# Patient Record
Sex: Female | Born: 1985
Health system: Southern US, Community
[De-identification: ages and names within clinical notes are randomized; demographics above are authoritative.]

## PROBLEM LIST (undated history)

## (undated) DIAGNOSIS — R000 Tachycardia, unspecified: Secondary | ICD-10-CM

## (undated) DIAGNOSIS — R238 Other skin changes: Secondary | ICD-10-CM

## (undated) DIAGNOSIS — L732 Hidradenitis suppurativa: Secondary | ICD-10-CM

## (undated) DIAGNOSIS — K760 Fatty (change of) liver, not elsewhere classified: Secondary | ICD-10-CM

## (undated) DIAGNOSIS — R233 Spontaneous ecchymoses: Secondary | ICD-10-CM

## (undated) DIAGNOSIS — R42 Dizziness and giddiness: Secondary | ICD-10-CM

## (undated) DIAGNOSIS — H43399 Other vitreous opacities, unspecified eye: Secondary | ICD-10-CM

## (undated) DIAGNOSIS — R490 Dysphonia: Secondary | ICD-10-CM

## (undated) DIAGNOSIS — R062 Wheezing: Secondary | ICD-10-CM

## (undated) DIAGNOSIS — O039 Complete or unspecified spontaneous abortion without complication: Secondary | ICD-10-CM

## (undated) DIAGNOSIS — N814 Uterovaginal prolapse, unspecified: Secondary | ICD-10-CM

## (undated) DIAGNOSIS — F439 Reaction to severe stress, unspecified: Secondary | ICD-10-CM

## (undated) DIAGNOSIS — R519 Headache, unspecified: Secondary | ICD-10-CM

## (undated) DIAGNOSIS — I10 Essential (primary) hypertension: Secondary | ICD-10-CM

## (undated) DIAGNOSIS — B379 Candidiasis, unspecified: Secondary | ICD-10-CM

## (undated) DIAGNOSIS — M797 Fibromyalgia: Secondary | ICD-10-CM

## (undated) DIAGNOSIS — N63 Unspecified lump in unspecified breast: Secondary | ICD-10-CM

## (undated) DIAGNOSIS — E063 Autoimmune thyroiditis: Secondary | ICD-10-CM

## (undated) DIAGNOSIS — N898 Other specified noninflammatory disorders of vagina: Secondary | ICD-10-CM

## (undated) DIAGNOSIS — K589 Irritable bowel syndrome without diarrhea: Secondary | ICD-10-CM

## (undated) DIAGNOSIS — G43909 Migraine, unspecified, not intractable, without status migrainosus: Secondary | ICD-10-CM

## (undated) DIAGNOSIS — R5383 Other fatigue: Secondary | ICD-10-CM

## (undated) DIAGNOSIS — Z308 Encounter for other contraceptive management: Secondary | ICD-10-CM

## (undated) DIAGNOSIS — E7800 Pure hypercholesterolemia, unspecified: Secondary | ICD-10-CM

## (undated) DIAGNOSIS — R110 Nausea: Secondary | ICD-10-CM

## (undated) DIAGNOSIS — M2550 Pain in unspecified joint: Secondary | ICD-10-CM

## (undated) DIAGNOSIS — F32A Depression, unspecified: Secondary | ICD-10-CM

## (undated) DIAGNOSIS — L853 Xerosis cutis: Secondary | ICD-10-CM

## (undated) DIAGNOSIS — E041 Nontoxic single thyroid nodule: Secondary | ICD-10-CM

## (undated) DIAGNOSIS — K5900 Constipation, unspecified: Secondary | ICD-10-CM

## (undated) DIAGNOSIS — M6289 Other specified disorders of muscle: Secondary | ICD-10-CM

## (undated) DIAGNOSIS — F329 Major depressive disorder, single episode, unspecified: Secondary | ICD-10-CM

## (undated) DIAGNOSIS — M549 Dorsalgia, unspecified: Secondary | ICD-10-CM

## (undated) DIAGNOSIS — E039 Hypothyroidism, unspecified: Secondary | ICD-10-CM

## (undated) DIAGNOSIS — E049 Nontoxic goiter, unspecified: Secondary | ICD-10-CM

## (undated) DIAGNOSIS — R252 Cramp and spasm: Secondary | ICD-10-CM

## (undated) DIAGNOSIS — R599 Enlarged lymph nodes, unspecified: Secondary | ICD-10-CM

## (undated) DIAGNOSIS — T7840XA Allergy, unspecified, initial encounter: Secondary | ICD-10-CM

## (undated) DIAGNOSIS — M791 Myalgia, unspecified site: Secondary | ICD-10-CM

## (undated) DIAGNOSIS — R6889 Other general symptoms and signs: Secondary | ICD-10-CM

## (undated) DIAGNOSIS — H539 Unspecified visual disturbance: Secondary | ICD-10-CM

## (undated) DIAGNOSIS — N943 Premenstrual tension syndrome: Secondary | ICD-10-CM

## (undated) DIAGNOSIS — R51 Headache: Secondary | ICD-10-CM

## (undated) DIAGNOSIS — M7989 Other specified soft tissue disorders: Secondary | ICD-10-CM

## (undated) HISTORY — DX: Other specified soft tissue disorders: M79.89

## (undated) HISTORY — DX: Major depressive disorder, single episode, unspecified: F32.9

## (undated) HISTORY — DX: Unspecified visual disturbance: H53.9

## (undated) HISTORY — DX: Xerosis cutis: L85.3

## (undated) HISTORY — DX: Wheezing: R06.2

## (undated) HISTORY — DX: Dizziness and giddiness: R42

## (undated) HISTORY — DX: Other fatigue: R53.83

## (undated) HISTORY — DX: Depression, unspecified: F32.A

## (undated) HISTORY — DX: Reaction to severe stress, unspecified: F43.9

## (undated) HISTORY — DX: Tachycardia, unspecified: R00.0

## (undated) HISTORY — DX: Fibromyalgia: M79.7

## (undated) HISTORY — DX: Enlarged lymph nodes, unspecified: R59.9

## (undated) HISTORY — DX: Cramp and spasm: R25.2

## (undated) HISTORY — DX: Migraine, unspecified, not intractable, without status migrainosus: G43.909

## (undated) HISTORY — DX: Dysphonia: R49.0

## (undated) HISTORY — DX: Other specified disorders of muscle: M62.89

## (undated) HISTORY — DX: Myalgia, unspecified site: M79.10

## (undated) HISTORY — DX: Nontoxic single thyroid nodule: E04.1

## (undated) HISTORY — DX: Constipation, unspecified: K59.00

## (undated) HISTORY — DX: Nontoxic goiter, unspecified: E04.9

## (undated) HISTORY — DX: Pure hypercholesterolemia, unspecified: E78.00

## (undated) HISTORY — DX: Headache, unspecified: R51.9

## (undated) HISTORY — DX: Nausea: R11.0

## (undated) HISTORY — DX: Complete or unspecified spontaneous abortion without complication: O03.9

## (undated) HISTORY — DX: Other vitreous opacities, unspecified eye: H43.399

## (undated) HISTORY — DX: Unspecified lump in unspecified breast: N63.0

## (undated) HISTORY — DX: Essential (primary) hypertension: I10

## (undated) HISTORY — DX: Hypothyroidism, unspecified: E03.9

## (undated) HISTORY — DX: Pain in unspecified joint: M25.50

## (undated) HISTORY — DX: Other general symptoms and signs: R68.89

## (undated) HISTORY — DX: Other specified noninflammatory disorders of vagina: N89.8

## (undated) HISTORY — DX: Spontaneous ecchymoses: R23.3

## (undated) HISTORY — DX: Autoimmune thyroiditis: E06.3

## (undated) HISTORY — DX: Fatty (change of) liver, not elsewhere classified: K76.0

## (undated) HISTORY — DX: Dorsalgia, unspecified: M54.9

## (undated) HISTORY — DX: Encounter for other contraceptive management: Z30.8

## (undated) HISTORY — DX: Allergy, unspecified, initial encounter: T78.40XA

## (undated) HISTORY — DX: Hidradenitis suppurativa: L73.2

## (undated) HISTORY — DX: Headache: R51

## (undated) HISTORY — DX: Other skin changes: R23.8

## (undated) HISTORY — DX: Premenstrual tension syndrome: N94.3

## (undated) HISTORY — DX: Irritable bowel syndrome, unspecified: K58.9

## (undated) HISTORY — DX: Uterovaginal prolapse, unspecified: N81.4

## (undated) HISTORY — DX: Candidiasis, unspecified: B37.9

---
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
## 2005-01-17 ENCOUNTER — Ambulatory Visit (HOSPITAL_COMMUNITY): Admission: RE | Admit: 2005-01-17 | Discharge: 2005-01-18 | Payer: Self-pay | Admitting: Obstetrics and Gynecology

## 2005-01-19 ENCOUNTER — Ambulatory Visit: Payer: Self-pay | Admitting: *Deleted

## 2006-08-24 ENCOUNTER — Inpatient Hospital Stay (HOSPITAL_COMMUNITY): Admission: AD | Admit: 2006-08-24 | Discharge: 2006-08-26 | Payer: Self-pay | Admitting: Obstetrics and Gynecology

## 2007-05-18 ENCOUNTER — Other Ambulatory Visit: Admission: RE | Admit: 2007-05-18 | Discharge: 2007-05-18 | Payer: Self-pay | Admitting: Obstetrics and Gynecology

## 2008-08-22 ENCOUNTER — Other Ambulatory Visit: Admission: RE | Admit: 2008-08-22 | Discharge: 2008-08-22 | Payer: Self-pay | Admitting: Obstetrics and Gynecology

## 2009-05-21 ENCOUNTER — Ambulatory Visit (HOSPITAL_COMMUNITY): Admission: RE | Admit: 2009-05-21 | Discharge: 2009-05-21 | Payer: Self-pay | Admitting: Obstetrics & Gynecology

## 2009-09-17 ENCOUNTER — Other Ambulatory Visit: Admission: RE | Admit: 2009-09-17 | Discharge: 2009-09-17 | Payer: Self-pay | Admitting: Obstetrics and Gynecology

## 2010-04-01 ENCOUNTER — Inpatient Hospital Stay (HOSPITAL_COMMUNITY)
Admission: AD | Admit: 2010-04-01 | Discharge: 2010-04-03 | Payer: Self-pay | Source: Home / Self Care | Attending: Family Medicine | Admitting: Family Medicine

## 2010-05-24 ENCOUNTER — Ambulatory Visit (HOSPITAL_COMMUNITY)
Admission: RE | Admit: 2010-05-24 | Discharge: 2010-05-24 | Disposition: A | Discharge status: Home / Self Care | Payer: Commercial Managed Care - PPO | Source: Ambulatory Visit | Attending: Obstetrics and Gynecology | Admitting: Obstetrics and Gynecology

## 2010-05-28 ENCOUNTER — Ambulatory Visit (HOSPITAL_COMMUNITY)
Admission: RE | Admit: 2010-05-28 | Discharge: 2010-05-28 | Disposition: A | Discharge status: Home / Self Care | Payer: Commercial Managed Care - PPO | Source: Ambulatory Visit | Attending: Obstetrics and Gynecology | Admitting: Obstetrics and Gynecology

## 2010-05-28 DIAGNOSIS — O923 Agalactia: Secondary | ICD-10-CM | POA: Insufficient documentation

## 2010-05-28 DIAGNOSIS — O925 Suppressed lactation: Secondary | ICD-10-CM | POA: Insufficient documentation

## 2010-06-11 ENCOUNTER — Ambulatory Visit (HOSPITAL_COMMUNITY)
Admission: RE | Admit: 2010-06-11 | Discharge: 2010-06-11 | Disposition: A | Discharge status: Home / Self Care | Payer: 59 | Source: Ambulatory Visit | Attending: Obstetrics and Gynecology | Admitting: Obstetrics and Gynecology

## 2010-06-11 DIAGNOSIS — O925 Suppressed lactation: Secondary | ICD-10-CM | POA: Insufficient documentation

## 2010-06-14 LAB — CBC
Hemoglobin: 11.5 g/dL — ABNORMAL LOW (ref 12.0–15.0)
MCH: 24.9 pg — ABNORMAL LOW (ref 26.0–34.0)
MCHC: 32.8 g/dL (ref 30.0–36.0)
Platelets: 245 10*3/uL (ref 150–400)

## 2010-06-14 LAB — ABO/RH: ABO/RH(D): O POS

## 2010-06-14 LAB — RPR: RPR Ser Ql: NONREACTIVE

## 2010-08-17 NOTE — H&P (Signed)
Hailey Holden, Hailey Holden               ACCOUNT NO.:  [id number]   MEDICAL RECORD NO.:  [id number]          PATIENT TYPE:  INP   LOCATION:  LDR1                          FACILITY:  [facility]   PHYSICIAN:  Lazaro Arms, M.D.   DATE OF BIRTH:  [birth year]   DATE OF ADMISSION:  08/24/2006  DATE OF DISCHARGE:  LH                              HISTORY & PHYSICAL   CHIEF COMPLAINT:  Active labor.   HISTORY OF PRESENT ILLNESS:  Hailey Holden is a 25 year-old gravida 1 para 0  within [facility] of September 03, 2006; placing her at about [redacted] weeks gestation.  She began prenatal care in her first trimester; and his head regular  visit and then.  Her pregnancy course has been uneventful.  Blood type  of positive, rubella immune, HV SAG, HIV, HSV, RPR, gonorrhea,  chlamydia, and group B Strep are all negative.  Blood pressures have  been 130s to 150s over 70s to 80s.  Total weight gain has been a  swelling 25 pounds with appropriate fundal height growth.   PAST MEDICAL HISTORY:  Positive for asthma and migraines.  No surgical  history   ALLERGIES:  PENICILLIN, LATEX, CLINDAMYCIN.   MEDICATIONS:  Ventolin inhaler and albuterol p.r.n.   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  Denies cigarettes, smoking, alcohol or drug use.   FAMILY HISTORY:  No contributing family history.   PHYSICAL EXAMINATION:  HEENT:  Within normal limits.  HEART:  Regular rate and rhythm.  LUNGS:  Clear.  ABDOMEN:  Soft and nontender.  She is having not her contractions every  few minutes.  Fetal heart rate is reactive without decelerations.  PELVIC:  Cervical exam upon admission 1-1/2 hours ago was 6 cm hundred  percent effaced, 0 station, with bulging membranes.  EXTREMITIES:  Trace edema.   IMPRESSION:  1. Intrauterine pregnancy at term.  2. Active labor.  3. Reassuring maternal-fetal status.   PLAN:  We will AROM and Dr. Despina Hidden is placing an epidural as I speak.      Jacklyn Shell,  C.N.M.      Lazaro Arms, M.D.  Electronically Signed    FC/MEDQ  D:  08/24/2006  T:  08/24/2006  Job:  [id number]   cc:   Dr. Gerda Diss   [facility]

## 2010-08-17 NOTE — Group Therapy Note (Signed)
[name], Hailey Holden               ACCOUNT NO.:  [id number]   MEDICAL RECORD NO.:  [id number]          PATIENT TYPE:  INP   LOCATION:  LDR4                          FACILITY:  [facility]   PHYSICIAN:  Lazaro Arms, M.D.   DATE OF BIRTH:  [birth year]   DATE OF PROCEDURE:  08/24/2006  DATE OF DISCHARGE:                                 PROGRESS NOTE   Hailey Holden received an epidural and while she was sitting up her water broke  with clear fluid.  She rapidly progressed in labor and was noted to be  complete at +3 station after about a 30-minute second stage she had a  spontaneous vaginal delivery of a viable female infant at 19:10.  Just  prior to delivery, a small midline episiotomy was cut. The body  delivered without difficulty.  Weight is 8 pounds 4 ounces, Apgar are 9  and 9. Twenty units of Pitocin diluted in 1000 mL of lactated Ringer's  was infused rapidly IV.  The placenta separated spontaneously and  delivered via controlled cord traction and 19:16.  It was inspected and  appears to be intact with a three-vessel cord.  The vagina was inspected  and no extension of the episiotomy was made. It was repaired under local  anesthesia with a 2-0 Vicryl suture.  The epidural catheter was then  removed with the blue tip visualized as being intact.  Estimated blood  loss 400 mL.      Hailey Holden, C.N.M.      Lazaro Arms, M.D.  Electronically Signed    FC/MEDQ  D:  08/24/2006  T:  08/24/2006  Job:  [id number]

## 2010-08-17 NOTE — Op Note (Signed)
NAMEASHLEY, BULTEMA               ACCOUNT NO.:  [id number]   MEDICAL RECORD NO.:  [id number]          PATIENT TYPE:  INP   LOCATION:  A412                          FACILITY:  [facility]   PHYSICIAN:  Lazaro Arms, M.D.   DATE OF BIRTH:  1985-10-01   DATE OF PROCEDURE:  08/24/2006  DATE OF DISCHARGE:  08/26/2006                               OPERATIVE REPORT   EPIDURAL NOTE:  Hailey Holden is a 25 year old female, gravida 1, para 0, in  active phase of labor, requesting an epidural be placed.   The patient was placed in a sitting position, Betadine prep was used, 1%  lidocaine was injected in the L3-4 interspace.  The area is field-  draped.  A 17-gauge Tuohy needle was used, the loss of resistance  technique employed, and the epidural space is found with one pass  without difficulty.  Bupivacaine 0.125% plain 10 mL is given into the  epidural space without difficulty.  The epidural catheter is then fed,  taped down 5 cm into the epidural space.  An additional 10 mL is given.  A continuous infusion of 0.125% bupivacaine with 2 mcg/mL of fentanyl  was begun at 12 mL/hr.  The patient tolerated the procedure well.  Fetal  heart rate tracing stable, blood pressure stable, and the patient is  getting pain relief.      Lazaro Arms, M.D.  Electronically Signed     LHE/MEDQ  D:  09/22/2006  T:  09/23/2006  Job:  [id number]

## 2010-09-02 ENCOUNTER — Other Ambulatory Visit: Payer: Self-pay | Admitting: Adult Health

## 2010-09-02 ENCOUNTER — Other Ambulatory Visit (HOSPITAL_COMMUNITY)
Admission: RE | Admit: 2010-09-02 | Discharge: 2010-09-02 | Disposition: A | Discharge status: Home / Self Care | Payer: 59 | Source: Ambulatory Visit | Attending: Obstetrics and Gynecology | Admitting: Obstetrics and Gynecology

## 2010-09-02 DIAGNOSIS — Z124 Encounter for screening for malignant neoplasm of cervix: Secondary | ICD-10-CM | POA: Insufficient documentation

## 2011-05-20 ENCOUNTER — Telehealth (HOSPITAL_COMMUNITY): Payer: Self-pay | Admitting: Dietician

## 2011-05-20 NOTE — Telephone Encounter (Signed)
Received referral from [facility] for dx: high cholesterol and LDL. Appointment set up for 06/03/11 at 2:30 PM.

## 2011-06-03 ENCOUNTER — Encounter (HOSPITAL_COMMUNITY): Payer: Self-pay | Admitting: Dietician

## 2011-06-06 ENCOUNTER — Encounter (HOSPITAL_COMMUNITY): Payer: Self-pay | Admitting: Dietician

## 2011-06-06 DIAGNOSIS — E785 Hyperlipidemia, unspecified: Secondary | ICD-10-CM | POA: Insufficient documentation

## 2011-06-06 NOTE — Progress Notes (Signed)
Outpatient Initial Nutrition Assessment  Date:06/03/2011   Time: 2:30 PM  Referring Physician: [facility] Reason for Visit: high cholesterol  Nutrition Assessment:  Ht: Height: 5\' 3"  (160 cm)   Wt:Weight: 188 lb (85.276 kg)   IBW: 115# %IBW: 163% UBW: 188# %UBW: 100% BMI: Body mass index is 33.30 kg/(m^2).  Goal Weight: 170# (10% weight loss) Weight hx: Pt reports 188# as her highest weight. Her lowest weight has 160-165# in high school, which she was able to maintain until about 3 years ago, due to her parents' divorce.   Estimated nutritional needs: 1806-1970 kcals daily, 68-85 grams protein daily, 1.8-2.0 L fluid daily  PMH:  Past Medical History  Diagnosis Date  . Asthma   . High cholesterol     Medications:  Current Outpatient Rx  Name Route Sig Dispense Refill  . VENTOLIN IN Inhalation Inhale into the lungs as needed.      Labs: CMP  No results found for this basename: na, k, cl, co2, glucose, bun, creatinine, calcium, prot, albumin, ast, alt, alkphos, bilitot, gfrnonaa, gfraa    Lipid Panel  No results found for this basename: chol, trig, hdl, cholhdl, vldl, ldlcalc     No results found for this basename: HGBA1C   No results found for this basename: GLUF, MICROALBUR, LDLCALC, CREATININE    Per [facility] work on 03/25/11: Na: 138, K: 4.5, Cl: 105, CO2: 21, BUN: 10, Creat: 0.6, Glucose: 78, Total Cholesterol: 244, LDL: 183, HDL: 41, Triglycerides: 98  Lifestyle/ social habits: Hailey Holden is a very pleasant woman, who lives in [place] with her husband and 2 children, ages 93 and 42 months. She works as an Public house manager at [employer]. She has lived in [place] her entire life. Her family also still resides here. She is very close with her mom and sister. Her relationship with her father is somewhat strained due to the divorce. She reports her stress level at 5, due to daily life and work. She reports "if you asked me 3 years ago it would have been an 8".   Nutrition  hx/habits: Hailey Holden is very concerned about her high cholesterol and is very grateful for the [facility] program. She is motivated to make changes. She requested this visit so she could learn and work on her diet prior to attending the classes. She has cut back on sweets and has been drinking more water. She reports she does not drink as many sodas, but is having difficulty reducing her sweet tea intake. She reports that she is a "stress eater". She currently does not exercise, but has tried Zumba in the past, which she enjoyed. She has also cut added salt out of her diet. She has a supportive work environment. She has been recording her food intake in a food diary for 1 week.   Diet recall: Breakfast: 1 cinnamon pop tart, mountain dew, granola bar; Snack: chex mix, water; Lunch: 2 [place], cheese, and mustard wraps, carrots, water; Snack: water, chex mix; Dinner: steak, corn, fried potatoes, deviled eggs, mountain dew; Snack: strawberry cake  Nutrition Diagnosis: Disordered eating pattern r/t excessive snacking AEB Total cholesterol: 244, gradual weight loss.   Nutrition Intervention: Nutrition rx: 1500 kcal NAS, heart healthy diet; 3 meals/day, 1 snack; 30 minutes physical activity daily  Education/Counseling Provided: Educated pt on heart healthy diet. Educated pt on reading food labels. Reviewed heart healthy cooking methods and food preparation methods. Reviewed portion sizes. Discussed importance of regular physical activity to assist with weight loss; discussed  slow, moderate weight loss of 1-2# per week. Discussed healthier snack ideas and ways to incorporate exercise into schedule. Praised pt for starting food diary; showed pt functionality of MyFitnessPal.  Understanding, Motivation, Ability to Follow Recommendations: Expect fair to good compliance.  Monitoring and Evaluation: Goals: 1) 1-2# weight loss per week; 2) 30 minutes physical activity daily  Recommendations: 1) For weight loss:  1306-1470 kcals daily; 2) Consider obtaining gym membership or prepaid exercise classes to hold yourself accountable; 3) Continue with food diary (ex. MyFitnessPal)  F/U:PRN. Provided RD contact information.   Orlene Plum, RD  06/03/2011  Time: 2:30 PM

## 2011-09-13 ENCOUNTER — Other Ambulatory Visit: Payer: Self-pay | Admitting: Adult Health

## 2011-09-13 DIAGNOSIS — E049 Nontoxic goiter, unspecified: Secondary | ICD-10-CM

## 2011-09-14 ENCOUNTER — Ambulatory Visit (HOSPITAL_COMMUNITY)
Admission: RE | Admit: 2011-09-14 | Discharge: 2011-09-14 | Disposition: A | Discharge status: Home / Self Care | Payer: 59 | Source: Ambulatory Visit | Attending: Adult Health | Admitting: Adult Health

## 2011-09-14 DIAGNOSIS — E049 Nontoxic goiter, unspecified: Secondary | ICD-10-CM | POA: Insufficient documentation

## 2012-08-22 ENCOUNTER — Other Ambulatory Visit: Payer: Self-pay | Admitting: Adult Health

## 2012-09-03 ENCOUNTER — Ambulatory Visit (INDEPENDENT_AMBULATORY_CARE_PROVIDER_SITE_OTHER): Payer: 59 | Admitting: Nurse Practitioner

## 2012-09-03 ENCOUNTER — Encounter: Payer: Self-pay | Admitting: Nurse Practitioner

## 2012-09-03 VITALS — BP 128/70 | Temp 98.5°F | Ht 64.0 in | Wt 195.2 lb

## 2012-09-03 DIAGNOSIS — J069 Acute upper respiratory infection, unspecified: Secondary | ICD-10-CM

## 2012-09-03 DIAGNOSIS — J45901 Unspecified asthma with (acute) exacerbation: Secondary | ICD-10-CM

## 2012-09-03 DIAGNOSIS — J45909 Unspecified asthma, uncomplicated: Secondary | ICD-10-CM | POA: Insufficient documentation

## 2012-09-03 DIAGNOSIS — J4521 Mild intermittent asthma with (acute) exacerbation: Secondary | ICD-10-CM

## 2012-09-03 MED ORDER — ALBUTEROL SULFATE HFA 108 (90 BASE) MCG/ACT IN AERS: 2.0000 | INHALATION_SPRAY | RESPIRATORY_TRACT | Status: DC | PRN

## 2012-09-03 MED ORDER — AZITHROMYCIN 250 MG PO TABS: ORAL_TABLET | ORAL | Status: DC

## 2012-09-03 MED ORDER — PREDNISONE 20 MG PO TABS: ORAL_TABLET | ORAL | Status: DC

## 2012-09-03 NOTE — Assessment & Plan Note (Signed)
Z-Pak as directed. Prednisone 20 mg nine-day taper. Switched to Ventolin HFA inhaler. OTC meds as directed for congestion. Call back in 48-72 hours if no improvement, sooner if worse.

## 2012-09-03 NOTE — Progress Notes (Signed)
Subjective:  Presents complaints of cough and congestion for the past 5 days. No fever. Frequent nonproductive cough. Green nasal drainage. Flareup of her asthma, frequent use of her albuterol inhaler. Has used it 2-3 times today. No headache. Slight sore throat. No ear pain. Taking fluids well.  Objective:   BP 128/70  Temp(Src) 98.5 F (36.9 C) (Oral)  Ht 5\' 4"  (1.626 m)  Wt 195 lb 4 oz (88.565 kg)  BMI 33.5 kg/m2 NAD. Alert, oriented. TMs minimal clear effusion, no erythema. Pharynx mildly erythematous with PND noted. Neck supple with mild soft nontender adenopathy. Lungs breath sounds mildly diminished in general, no wheezing. No tachypnea. Mild shortness of breath with talking. Patient defers nebulizer treatment, plans to use her inhaler due to palpitations with nebulizer treatment. Heart regular rate rhythm.  Assessment:Acute upper respiratory infection  Asthma, mild intermittent, with acute exacerbation  Plan: Z-Pak as directed. Prednisone 20 mg nine-day taper. Switched to Ventolin HFA inhaler. OTC meds as directed for congestion. Call back in 48-72 hours if no improvement, sooner if worse.

## 2012-09-27 ENCOUNTER — Other Ambulatory Visit (HOSPITAL_COMMUNITY)
Admission: RE | Admit: 2012-09-27 | Discharge: 2012-09-27 | Disposition: A | Discharge status: Home / Self Care | Payer: 59 | Source: Ambulatory Visit | Attending: Obstetrics and Gynecology | Admitting: Obstetrics and Gynecology

## 2012-09-27 ENCOUNTER — Encounter: Payer: Self-pay | Admitting: Adult Health

## 2012-09-27 ENCOUNTER — Ambulatory Visit (INDEPENDENT_AMBULATORY_CARE_PROVIDER_SITE_OTHER): Payer: 59 | Admitting: Adult Health

## 2012-09-27 VITALS — BP 120/78 | HR 104 | Ht 64.0 in | Wt 195.0 lb

## 2012-09-27 DIAGNOSIS — Z01419 Encounter for gynecological examination (general) (routine) without abnormal findings: Secondary | ICD-10-CM

## 2012-09-27 DIAGNOSIS — Z309 Encounter for contraceptive management, unspecified: Secondary | ICD-10-CM

## 2012-09-27 DIAGNOSIS — E049 Nontoxic goiter, unspecified: Secondary | ICD-10-CM

## 2012-09-27 DIAGNOSIS — N943 Premenstrual tension syndrome: Secondary | ICD-10-CM | POA: Insufficient documentation

## 2012-09-27 DIAGNOSIS — E7800 Pure hypercholesterolemia, unspecified: Secondary | ICD-10-CM

## 2012-09-27 DIAGNOSIS — I10 Essential (primary) hypertension: Secondary | ICD-10-CM | POA: Insufficient documentation

## 2012-09-27 HISTORY — DX: Premenstrual tension syndrome: N94.3

## 2012-09-27 HISTORY — DX: Nontoxic goiter, unspecified: E04.9

## 2012-09-27 NOTE — Progress Notes (Signed)
Patient ID: Hailey Holden, female   DOB: Apr 01, 1986, 27 y.o.   MRN: [id number] History of Present Illness: Jaselle is a 27 year old white female, married in for her pap and physical.  Current Medications, Allergies, Past Medical History, Past Surgical History, Family History and Social History were reviewed in [employer] electronic medical record.    Review of Systems: Patient denies any headaches, blurred vision, shortness of breath, chest pain, abdominal pain, problems with bowel movements, urination, or intercourse. No joint pains, she has noticed that after her period she is moody.  Physical Exam:BP 120/78  Pulse 104  Ht 5\' 4"  (1.626 m)  Wt 195 lb (88.451 kg)  BMI 33.46 kg/m2  LMP 09/13/2012 General:  Well developed, well nourished, no acute distress Skin:  Warm and dry Neck:  Midline trachea, enlarge  Thyroid, right>left Lungs; Clear to auscultation bilaterally Breast:  No dominant palpable mass, retraction, or nipple discharge Cardiovascular: Regular rate and rhythm Abdomen:  Soft, non tender, no hepatosplenomegaly Pelvic:  External genitalia is normal in appearance, she has a few skin tags and some mild hidradenitis in groin area..  The vagina is normal in appearance. The cervix is bulbous. Pap performed. Uterus is felt to be normal size, shape, and contour.  No  adnexal masses or tenderness noted. Extremities:  No swelling or varicosities noted Psych: Alert and cooperative and seems happy, except is moody after period  Impression: Yearly gyn exam  Contraceptive management Enlarged thyroid Hypertension PMS Elevated cholesterol  Plan: Continue current meds, but take 1 and 1/2 zoloft the week after period and let me know if that helps Physical in 1 year Labs in near future fasting

## 2012-09-27 NOTE — Patient Instructions (Addendum)
Physical in 1 year Labs in near future Try 1 and 1/2 zoloft after menses

## 2012-10-02 ENCOUNTER — Other Ambulatory Visit: Payer: 59

## 2012-10-02 DIAGNOSIS — E049 Nontoxic goiter, unspecified: Secondary | ICD-10-CM

## 2012-10-02 DIAGNOSIS — Z131 Encounter for screening for diabetes mellitus: Secondary | ICD-10-CM

## 2012-10-02 DIAGNOSIS — E782 Mixed hyperlipidemia: Secondary | ICD-10-CM

## 2012-10-02 LAB — LIPID PANEL
Cholesterol: 210 mg/dL — ABNORMAL HIGH (ref 0–200)
HDL: 34 mg/dL — ABNORMAL LOW (ref >39)
Total CHOL/HDL Ratio: 6.2 Ratio
VLDL: 26 mg/dL (ref 0–40)

## 2012-12-20 ENCOUNTER — Telehealth: Payer: Self-pay | Admitting: Adult Health

## 2012-12-20 MED ORDER — AMLODIPINE BESYLATE 5 MG PO TABS: 5.0000 mg | ORAL_TABLET | Freq: Every day | ORAL | Status: DC

## 2012-12-20 MED ORDER — VALACYCLOVIR HCL 1 G PO TABS: 1000.0000 mg | ORAL_TABLET | Freq: Every day | ORAL | Status: DC

## 2012-12-20 NOTE — Telephone Encounter (Signed)
meds refilled 

## 2013-01-08 ENCOUNTER — Other Ambulatory Visit: Payer: Self-pay | Admitting: *Deleted

## 2013-01-08 MED ORDER — HYDROCHLOROTHIAZIDE 12.5 MG PO CAPS: 12.5000 mg | ORAL_CAPSULE | Freq: Every day | ORAL | Status: DC

## 2013-02-07 ENCOUNTER — Other Ambulatory Visit: Payer: Self-pay

## 2013-03-05 ENCOUNTER — Other Ambulatory Visit: Payer: Self-pay | Admitting: Adult Health

## 2013-03-05 MED ORDER — AMLODIPINE BESYLATE 10 MG PO TABS: 10.0000 mg | ORAL_TABLET | Freq: Every day | ORAL | Status: DC

## 2013-03-11 ENCOUNTER — Other Ambulatory Visit: Payer: Self-pay | Admitting: Adult Health

## 2013-03-11 MED ORDER — AMLODIPINE BESYLATE 10 MG PO TABS: 10.0000 mg | ORAL_TABLET | Freq: Every day | ORAL | Status: DC

## 2013-03-13 ENCOUNTER — Other Ambulatory Visit: Payer: Self-pay | Admitting: Adult Health

## 2013-03-13 MED ORDER — PREDNISONE 10 MG PO TABS: ORAL_TABLET | ORAL | Status: DC

## 2013-04-12 ENCOUNTER — Other Ambulatory Visit: Payer: Self-pay | Admitting: Adult Health

## 2013-04-12 MED ORDER — SULFAMETHOXAZOLE-TMP DS 800-160 MG PO TABS: 1.0000 | ORAL_TABLET | Freq: Two times a day (BID) | ORAL | Status: DC

## 2013-07-12 ENCOUNTER — Encounter: Payer: Self-pay | Admitting: *Deleted

## 2013-07-17 ENCOUNTER — Ambulatory Visit (INDEPENDENT_AMBULATORY_CARE_PROVIDER_SITE_OTHER): Payer: 59 | Admitting: Nurse Practitioner

## 2013-07-17 ENCOUNTER — Encounter: Payer: Self-pay | Admitting: Nurse Practitioner

## 2013-07-17 VITALS — BP 132/90 | Ht 64.0 in | Wt 195.0 lb

## 2013-07-17 DIAGNOSIS — E6609 Other obesity due to excess calories: Secondary | ICD-10-CM | POA: Insufficient documentation

## 2013-07-17 DIAGNOSIS — I10 Essential (primary) hypertension: Secondary | ICD-10-CM

## 2013-07-17 DIAGNOSIS — Z6835 Body mass index (BMI) 35.0-35.9, adult: Secondary | ICD-10-CM

## 2013-07-17 MED ORDER — PHENTERMINE HCL 37.5 MG PO TABS: 37.5000 mg | ORAL_TABLET | Freq: Every day | ORAL | Status: DC

## 2013-07-17 NOTE — Progress Notes (Signed)
Subjective:  Presents to discuss her weight. Trying to it regular exercise. Overall doing well with her diet. Continues to struggle with her weight loss. BP outside office is running very well. Is looking at consultation regarding her diet. Doing well off of Zoloft. Would like to try weight loss medication. No chest pain shortness of breath or palpitations.  Objective:   BP 132/90  Ht 5\' 4"  (1.626 m)  Wt 195 lb (88.451 kg)  BMI 33.46 kg/m2 NAD. Alert, oriented. Lungs clear. Heart regular rate rhythm. No murmur or gallop noted.  Assessment: Problem List Items Addressed This Visit     Cardiovascular and Mediastinum   Hypertension - Primary     Other   Morbid obesity   Relevant Medications      phentermine (ADIPEX-P) 37.5 MG tablet     discussed importance of regular activity and healthy diet. Reviewed potential adverse effects of phentermine. DC med and call if any problems. Otherwise followup in one month. Discussed options regarding dietary consultation, patient to decide and contact us if she needs assistance.

## 2013-08-23 ENCOUNTER — Ambulatory Visit (INDEPENDENT_AMBULATORY_CARE_PROVIDER_SITE_OTHER): Payer: 59 | Admitting: Nurse Practitioner

## 2013-08-23 VITALS — BP 114/78 | Ht 64.0 in | Wt 181.0 lb

## 2013-08-23 DIAGNOSIS — I10 Essential (primary) hypertension: Secondary | ICD-10-CM

## 2013-08-23 MED ORDER — PHENTERMINE HCL 37.5 MG PO TABS: 37.5000 mg | ORAL_TABLET | Freq: Every day | ORAL | Status: DC

## 2013-08-25 ENCOUNTER — Other Ambulatory Visit: Payer: Self-pay | Admitting: Adult Health

## 2013-08-26 ENCOUNTER — Encounter: Payer: Self-pay | Admitting: Nurse Practitioner

## 2013-08-26 NOTE — Progress Notes (Signed)
Subjective:  Presents for recheck. Doing well with diet. Has cut down on portions. Active lifestyle. Denies any side effects from Phentermine.   Objective:   BP 114/78  Ht 5\' 4"  (1.626 m)  Wt 181 lb (82.101 kg)  BMI 31.05 kg/m2 NAD. Alert, oriented. Lungs clear. Heart RRR. Has lost 14 pounds since last visit.   Assessment: Hypertension  Morbid obesity  Plan:  Meds ordered this encounter  Medications  . phentermine (ADIPEX-P) 37.5 MG tablet    Sig: Take 1 tablet (37.5 mg total) by mouth daily before breakfast.    Dispense:  30 tablet    Refill:  2    Order Specific Question:  Supervising Provider    Answer:  Merlyn Albert [[id number]]   Recheck in 3 months, sooner if any problems.

## 2013-10-17 ENCOUNTER — Encounter: Payer: Self-pay | Admitting: Adult Health

## 2013-10-17 ENCOUNTER — Ambulatory Visit (INDEPENDENT_AMBULATORY_CARE_PROVIDER_SITE_OTHER): Payer: 59 | Admitting: Adult Health

## 2013-10-17 VITALS — BP 130/80 | HR 84 | Ht 64.0 in | Wt 169.0 lb

## 2013-10-17 DIAGNOSIS — L732 Hidradenitis suppurativa: Secondary | ICD-10-CM | POA: Insufficient documentation

## 2013-10-17 DIAGNOSIS — E049 Nontoxic goiter, unspecified: Secondary | ICD-10-CM

## 2013-10-17 DIAGNOSIS — I10 Essential (primary) hypertension: Secondary | ICD-10-CM

## 2013-10-17 DIAGNOSIS — Z01419 Encounter for gynecological examination (general) (routine) without abnormal findings: Secondary | ICD-10-CM

## 2013-10-17 LAB — CBC
HCT: 38.8 % (ref 36.0–46.0)
Hemoglobin: 13.6 g/dL (ref 12.0–15.0)
MCH: 29.1 pg (ref 26.0–34.0)
MCHC: 35.1 g/dL (ref 30.0–36.0)
MCV: 83.1 fL (ref 78.0–100.0)
PLATELETS: 349 10*3/uL (ref 150–400)
RBC: 4.67 MIL/uL (ref 3.87–5.11)
RDW: 13.7 % (ref 11.5–15.5)
WBC: 6.7 10*3/uL (ref 4.0–10.5)

## 2013-10-17 LAB — LIPID PANEL
Cholesterol: 166 mg/dL (ref 0–200)
HDL: 38 mg/dL — ABNORMAL LOW (ref >39)
LDL CALC: 112 mg/dL — AB (ref 0–99)
Total CHOL/HDL Ratio: 4.4 Ratio
Triglycerides: 82 mg/dL (ref <150)
VLDL: 16 mg/dL (ref 0–40)

## 2013-10-17 LAB — COMPREHENSIVE METABOLIC PANEL
ALBUMIN: 3.9 g/dL (ref 3.5–5.2)
ALK PHOS: 86 U/L (ref 39–117)
ALT: 17 U/L (ref 0–35)
AST: 19 U/L (ref 0–37)
BUN: 9 mg/dL (ref 6–23)
CALCIUM: 8.7 mg/dL (ref 8.4–10.5)
CHLORIDE: 104 meq/L (ref 96–112)
CO2: 26 mEq/L (ref 19–32)
Creat: 0.55 mg/dL (ref 0.50–1.10)
Glucose, Bld: 81 mg/dL (ref 70–99)
POTASSIUM: 3.8 meq/L (ref 3.5–5.3)
SODIUM: 140 meq/L (ref 135–145)
Total Bilirubin: 1.7 mg/dL — ABNORMAL HIGH (ref 0.2–1.2)
Total Protein: 6.5 g/dL (ref 6.0–8.3)

## 2013-10-17 NOTE — Patient Instructions (Signed)
Physical in 1 year Continue weight loss efforts Think about birth control

## 2013-10-17 NOTE — Progress Notes (Signed)
Patient ID: Hailey Holden, female   DOB: 08/14/1985, 28 y.o.   MRN: [id number] History of Present Illness: Hailey Holden is a 28 year old white female, married, in for a physical.She had a normal pap 09/27/12.She has some constipation and she has lost 26 lbs in about 3 months, using adipex.She is using condoms for birth control.   Current Medications, Allergies, Past Medical History, Past Surgical History, Family History and Social History were reviewed in [facility] electronic medical record.     Review of Systems: Patient denies any headaches, blurred vision, shortness of breath, chest pain, abdominal pain, problems with  urination, or intercourse. No joint pain or mood swings,See HPI for positives.    Physical Exam:BP 130/80  Pulse 84  Ht 5\' 4"  (1.626 m)  Wt 169 lb (76.658 kg)  BMI 28.99 kg/m2  LMP 10/08/2013 General:  Well developed, well nourished, no acute distress Skin:  Warm and dry, has hidradenitis on lower abdomen.  Neck:  Midline trachea,  Thyroid enlarged Lungs; Clear to auscultation bilaterally Breast:  No dominant palpable mass, retraction, or nipple discharge Cardiovascular: Regular rate and rhythm Abdomen:  Soft, non tender, no hepatosplenomegaly Pelvic:  External genitalia is normal in appearance.  The vagina is normal in appearance.  The cervix is bulbous, everted at os.  Uterus is felt to be normal size, shape, and contour and has some descensus. No adnexal masses or tenderness noted. Extremities:  No swelling or varicosities noted Psych:  No mood changes, alert and cooperative,seems happy. Discussed using other birth control, options discussed, will think it over.Praised over weight loss, her BP is better and her hidradenitis is better.  Impression: Yearly gyn exam no pap Hypertension Hidradenitis Enlarged thyroid   Plan: Pap and physical in  1 year Check CBC,CMP,TSH and lipids Continue current meds Think about birth control options and let me  know Continue weight loss efforts

## 2013-10-18 LAB — TSH: TSH: 2.012 u[IU]/mL (ref 0.350–4.500)

## 2013-10-31 ENCOUNTER — Other Ambulatory Visit: Payer: Self-pay | Admitting: Adult Health

## 2013-10-31 MED ORDER — ALBUTEROL SULFATE HFA 108 (90 BASE) MCG/ACT IN AERS: INHALATION_SPRAY | RESPIRATORY_TRACT | Status: DC

## 2013-11-22 ENCOUNTER — Ambulatory Visit: Payer: 59 | Admitting: Nurse Practitioner

## 2013-11-29 ENCOUNTER — Ambulatory Visit: Payer: 59 | Admitting: Nurse Practitioner

## 2013-12-06 ENCOUNTER — Ambulatory Visit (INDEPENDENT_AMBULATORY_CARE_PROVIDER_SITE_OTHER): Payer: 59 | Admitting: Nurse Practitioner

## 2013-12-06 ENCOUNTER — Encounter: Payer: Self-pay | Admitting: Nurse Practitioner

## 2013-12-06 VITALS — BP 128/88 | Ht 64.0 in | Wt 163.0 lb

## 2013-12-06 DIAGNOSIS — J309 Allergic rhinitis, unspecified: Secondary | ICD-10-CM

## 2013-12-06 DIAGNOSIS — J300 Vasomotor rhinitis: Secondary | ICD-10-CM

## 2013-12-06 MED ORDER — PHENTERMINE HCL 37.5 MG PO TABS
37.5000 mg | ORAL_TABLET | Freq: Every day | ORAL | Status: DC
Start: 2013-12-06 — End: 2014-07-14

## 2013-12-06 NOTE — Patient Instructions (Signed)
nasacort AQ as directed 

## 2013-12-09 ENCOUNTER — Encounter: Payer: Self-pay | Admitting: Nurse Practitioner

## 2013-12-09 NOTE — Progress Notes (Signed)
Subjective:  Presents for recheck of her blood pressure and weight. Continues to do well with her weight loss. Regular activity. No chest pain or shortness of breath. No side effects from phentermine. Mild head congestion. No headache or fever. Minimal cough. Mild ear pressure.  Objective:   BP 128/88  Ht  (1.626 m)  Wt 163 lb (73.936 kg)  BMI 27.97 kg/m2  LMP 10/11/2013 NAD. Alert, oriented. TMs clear effusion, no erythema. Pharynx mildly injected with clear PND noted. Neck supple with mild soft anterior adenopathy. Lungs clear. Heart regular rate rhythm.  Assessment:  Problem List Items Addressed This Visit     Other   Morbid obesity   Relevant Medications      phentermine (ADIPEX-P) 37.5 MG tablet    Other Visit Diagnoses   Vasomotor rhinitis    -  Primary       Plan: Given one more month prescription for phentermine and stop for at least 6 months. Continue exercise and healthy diet. OTC antihistamines as directed. Nasacort AQ as directed. Call back if symptoms worsen or persist.

## 2014-01-09 ENCOUNTER — Other Ambulatory Visit: Payer: Self-pay | Admitting: Adult Health

## 2014-01-17 ENCOUNTER — Other Ambulatory Visit: Payer: Self-pay

## 2014-02-03 ENCOUNTER — Encounter: Payer: Self-pay | Admitting: Nurse Practitioner

## 2014-02-04 ENCOUNTER — Encounter: Payer: Self-pay | Admitting: Adult Health

## 2014-02-04 ENCOUNTER — Ambulatory Visit (INDEPENDENT_AMBULATORY_CARE_PROVIDER_SITE_OTHER): Payer: 59 | Admitting: Adult Health

## 2014-02-04 VITALS — BP 132/92 | Ht 64.0 in | Wt 158.0 lb

## 2014-02-04 DIAGNOSIS — N63 Unspecified lump in unspecified breast: Secondary | ICD-10-CM

## 2014-02-04 HISTORY — DX: Unspecified lump in unspecified breast: N63.0

## 2014-02-04 NOTE — Patient Instructions (Signed)
[facility]

## 2014-02-04 NOTE — Progress Notes (Signed)
Subjective:     Patient ID: Hailey Holden, female   DOB: February 08, 1986, 28 y.o.   MRN: [id number]  HPI Hailey Holden is a 28 year old white female, married in complaining of pain right breast and ?lump,had noticed before period and it is still there.  Review of Systems See HPI Reviewed past medical,surgical, social and family history. Reviewed medications and allergies.     Objective:   Physical Exam BP 132/92 mmHg  Ht 5\' 4"  (1.626 m)  Wt 158 lb (71.668 kg)  BMI 27.11 kg/m2    Skin warm and dry,  Breasts:no dominate palpable mass, retraction or nipple discharge on left, on right, no retraction or nipple discharge, has tender nodule at 6 o'clock 2 FB from nipple.  Assessment:     Right breast nodule    Plan:     [facility]   Follow up prn

## 2014-02-11 ENCOUNTER — Ambulatory Visit (HOSPITAL_COMMUNITY)
Admission: RE | Admit: 2014-02-11 | Discharge: 2014-02-11 | Disposition: A | Discharge status: Home / Self Care | Payer: 59 | Source: Ambulatory Visit | Attending: Adult Health | Admitting: Adult Health

## 2014-02-11 DIAGNOSIS — N63 Unspecified lump in unspecified breast: Secondary | ICD-10-CM

## 2014-02-11 DIAGNOSIS — N644 Mastodynia: Secondary | ICD-10-CM | POA: Insufficient documentation

## 2014-02-20 ENCOUNTER — Other Ambulatory Visit: Payer: Self-pay | Admitting: Adult Health

## 2014-02-21 ENCOUNTER — Other Ambulatory Visit: Payer: Self-pay | Admitting: Adult Health

## 2014-02-21 MED ORDER — AZITHROMYCIN 250 MG PO TABS: ORAL_TABLET | ORAL | Status: DC

## 2014-05-01 ENCOUNTER — Other Ambulatory Visit: Payer: Self-pay | Admitting: Adult Health

## 2014-06-16 ENCOUNTER — Other Ambulatory Visit: Payer: Self-pay | Admitting: Adult Health

## 2014-07-04 ENCOUNTER — Ambulatory Visit (INDEPENDENT_AMBULATORY_CARE_PROVIDER_SITE_OTHER): Payer: 59 | Admitting: Nurse Practitioner

## 2014-07-04 ENCOUNTER — Encounter: Payer: Self-pay | Admitting: Nurse Practitioner

## 2014-07-04 VITALS — BP 148/98 | Ht 64.0 in | Wt 172.0 lb

## 2014-07-04 DIAGNOSIS — I10 Essential (primary) hypertension: Secondary | ICD-10-CM

## 2014-07-04 DIAGNOSIS — R5383 Other fatigue: Secondary | ICD-10-CM | POA: Diagnosis not present

## 2014-07-04 DIAGNOSIS — M791 Myalgia, unspecified site: Secondary | ICD-10-CM

## 2014-07-04 DIAGNOSIS — J302 Other seasonal allergic rhinitis: Secondary | ICD-10-CM

## 2014-07-04 MED ORDER — PREDNISONE 20 MG PO TABS: ORAL_TABLET | ORAL | Status: DC

## 2014-07-04 MED ORDER — MONTELUKAST SODIUM 10 MG PO TABS: 10.0000 mg | ORAL_TABLET | Freq: Every day | ORAL | Status: DC

## 2014-07-04 MED ORDER — HYDROCHLOROTHIAZIDE 12.5 MG PO CAPS: ORAL_CAPSULE | ORAL | Status: DC

## 2014-07-04 MED ORDER — AMLODIPINE BESYLATE 10 MG PO TABS: 10.0000 mg | ORAL_TABLET | Freq: Every day | ORAL | Status: DC

## 2014-07-04 NOTE — Progress Notes (Signed)
Subjective:  Presents for recheck of her hypertension. Note the patient has not taken her medication so far this morning. BP outside office 128/86. No chest pain/ischemic type pain or shortness of breath. Is not exercising as much she used to. Would like to restart phentermine to help weight loss. Also having itchy watery eyes congestion and slight wheezing with weather change and pollen. No fever. No sore throat or ear pain. Has also been researching fibromyalgia because it runs in her family. Has diffuse achiness which is triggered mainly by stress. May go for a period of time without any difficulty. Associated with extreme fatigue. Symptoms improved when stress is down and she is exercising on a regular basis.   Objective:   BP 134/98 mmHg  Ht 5\' 4"  (1.626 m)  Wt 172 lb (78.019 kg)  BMI 29.51 kg/m2 NAD. Alert, oriented. TMs mildly retracted, no erythema. Conjunctiva mildly injected. Pharynx clear. Neck supple with mild soft anterior adenopathy. Lungs clear. Heart regular rate rhythm. Mild tachycardia, no murmur or gallop noted. BP on recheck sitting 148/98. Lower extremities no edema.  Assessment: Essential hypertension  Other seasonal allergic rhinitis  Myalgia - Plan: Vit D  25 hydroxy (rtn osteoporosis monitoring)/probable fibromyalgia related to anxiety  Other fatigue - Plan: Vit D  25 hydroxy (rtn osteoporosis monitoring)  Plan:  Meds ordered this encounter  Medications  . montelukast (SINGULAIR) 10 MG tablet    Sig: Take 1 tablet (10 mg total) by mouth at bedtime.    Dispense:  90 tablet    Refill:  0    Order Specific Question:  Supervising Provider    Answer:  Merlyn AlbertLUKING, WILLIAM S [[id number]]  . predniSONE (DELTASONE) 20 MG tablet    Sig: 3 po qd x 3 d then 2 po qd x 3 d then 1 po qd x 3 d    Dispense:  18 tablet    Refill:  0    Order Specific Question:  Supervising Provider    Answer:  Merlyn AlbertLUKING, WILLIAM S [[id number]]  . amLODipine (NORVASC) 10 MG tablet    Sig: Take 1 tablet (10 mg  total) by mouth daily.    Dispense:  90 tablet    Refill:  1    Order Specific Question:  Supervising Provider    Answer:  Merlyn AlbertLUKING, WILLIAM S [[id number]]  . hydrochlorothiazide (MICROZIDE) 12.5 MG capsule    Sig: TAKE 1 CAPSULE (12.5 MG TOTAL) BY MOUTH DAILY.    Dispense:  90 capsule    Refill:  1    Order Specific Question:  Supervising Provider    Answer:  Merlyn AlbertLUKING, WILLIAM S [[id number]]   Nasacort AQ as directed. Continue Zyrtec as directed. Add Singulair to her regimen. Consider inhaled steroid if symptoms persist. Given prescription for prednisone to hold in case it is needed for her symptoms. Encourage patient to consider daily medication to help her stress and fibromyalgia symptoms. Encourage regular activity and healthy diet. Patient to check BP on medication and call back with results. Return in about 6 months (around 01/03/2015).

## 2014-07-04 NOTE — Patient Instructions (Signed)
Zaditor (generic)  nasacort AQ as directed

## 2014-07-05 LAB — VITAMIN D 25 HYDROXY (VIT D DEFICIENCY, FRACTURES): VIT D 25 HYDROXY: 33.8 ng/mL (ref 30.0–100.0)

## 2014-07-14 ENCOUNTER — Other Ambulatory Visit: Payer: Self-pay | Admitting: Nurse Practitioner

## 2014-07-14 MED ORDER — PHENTERMINE HCL 37.5 MG PO TABS: 37.5000 mg | ORAL_TABLET | Freq: Every day | ORAL | Status: DC

## 2014-07-28 ENCOUNTER — Other Ambulatory Visit: Payer: Self-pay | Admitting: *Deleted

## 2014-07-28 MED ORDER — SERTRALINE HCL 50 MG PO TABS: 50.0000 mg | ORAL_TABLET | Freq: Every day | ORAL | Status: DC

## 2014-09-26 ENCOUNTER — Other Ambulatory Visit: Payer: Self-pay | Admitting: Nurse Practitioner

## 2014-09-26 MED ORDER — DULOXETINE HCL 30 MG PO CPEP: 30.0000 mg | ORAL_CAPSULE | Freq: Every day | ORAL | Status: DC

## 2014-10-15 ENCOUNTER — Other Ambulatory Visit: Payer: Self-pay | Admitting: *Deleted

## 2014-10-15 ENCOUNTER — Other Ambulatory Visit: Payer: Self-pay | Admitting: Nurse Practitioner

## 2014-10-15 MED ORDER — MONTELUKAST SODIUM 10 MG PO TABS: 10.0000 mg | ORAL_TABLET | Freq: Every day | ORAL | Status: DC

## 2014-10-21 ENCOUNTER — Ambulatory Visit (INDEPENDENT_AMBULATORY_CARE_PROVIDER_SITE_OTHER): Payer: 59 | Admitting: Adult Health

## 2014-10-21 ENCOUNTER — Encounter: Payer: Self-pay | Admitting: Adult Health

## 2014-10-21 ENCOUNTER — Other Ambulatory Visit (HOSPITAL_COMMUNITY)
Admission: RE | Admit: 2014-10-21 | Discharge: 2014-10-21 | Disposition: A | Discharge status: Home / Self Care | Payer: 59 | Source: Ambulatory Visit | Attending: Family Medicine | Admitting: Family Medicine

## 2014-10-21 VITALS — BP 122/80 | HR 100 | Ht 64.0 in | Wt 166.5 lb

## 2014-10-21 DIAGNOSIS — Z01419 Encounter for gynecological examination (general) (routine) without abnormal findings: Secondary | ICD-10-CM

## 2014-10-21 DIAGNOSIS — E049 Nontoxic goiter, unspecified: Secondary | ICD-10-CM

## 2014-10-21 DIAGNOSIS — Z1151 Encounter for screening for human papillomavirus (HPV): Secondary | ICD-10-CM | POA: Insufficient documentation

## 2014-10-21 DIAGNOSIS — N814 Uterovaginal prolapse, unspecified: Secondary | ICD-10-CM

## 2014-10-21 DIAGNOSIS — K5900 Constipation, unspecified: Secondary | ICD-10-CM | POA: Insufficient documentation

## 2014-10-21 DIAGNOSIS — L732 Hidradenitis suppurativa: Secondary | ICD-10-CM

## 2014-10-21 DIAGNOSIS — Z308 Encounter for other contraceptive management: Secondary | ICD-10-CM

## 2014-10-21 HISTORY — DX: Encounter for other contraceptive management: Z30.8

## 2014-10-21 HISTORY — DX: Uterovaginal prolapse, unspecified: N81.4

## 2014-10-21 NOTE — Progress Notes (Signed)
Patient ID: Hailey Holden, female   DOB: 10/20/1985, 29 y.o.   MRN: [id number] History of Present Illness: Hailey Holden is a 29 year old white female, married in for well woman gyn exam and pap.   Current Medications, Allergies, Past Medical History, Past Surgical History, Family History and Social History were reviewed in [facility] electronic medical record.     Review of Systems: Patient denies any headaches, hearing loss, fatigue, blurred vision, shortness of breath, chest pain, abdominal pain, problems with urination, or intercourse. No joint pain or mood swings.She has constipation, and it is chronic,moods good on meds. She has lost about 30 lbs.   Physical Exam:BP 122/80 mmHg  Pulse 100  Ht 5\' 4"  (1.626 m)  Wt 166 lb 8 oz (75.524 kg)  BMI 28.57 kg/m2  LMP  General:  Well developed, well nourished, no acute distress Skin:  Warm and dry Neck:  Midline trachea,  Thyroid enlarged, good ROM, no lymphadenopathy Lungs; Clear to auscultation bilaterally Breast:  No dominant palpable mass, retraction, or nipple discharge Cardiovascular: Regular rate and rhythm Abdomen:  Soft, non tender, no hepatosplenomegaly Pelvic:  External genitalia is normal in appearance, no lesions. She has hidradenitis in groin area. The vagina is normal in appearance, nuvaring in place. Urethra has no lesions or masses. The cervix is bulbous, everted at os and friable with EC brush, pap with HPV performed.  Uterus is felt to be normal size, shape, and contour.She has first degree uterine descensus.   No adnexal masses or tenderness noted.Bladder is non tender, no masses felt. Extremities/musculoskeletal:  No swelling or varicosities noted, no clubbing or cyanosis Psych:  No mood changes, alert and cooperative,seems happy   Impression: Well woman gyn exam with pap Uterine prolapse first degree  Constipation Thyroid enlarged Contraceptive management Hidradenitis     Plan: Eat 5-6 prunes per day and  increase water Physical in 1 year Pap in 3 years of normal with negative HPV Mammogram at 39 Fasting labs in near future Continue nuva ring, has refills

## 2014-10-21 NOTE — Patient Instructions (Signed)
Physical in 1 year Mammogram at 39 Pap in 3 years if normal

## 2014-10-22 LAB — CYTOLOGY - PAP

## 2014-12-06 ENCOUNTER — Other Ambulatory Visit: Payer: Self-pay | Admitting: Nurse Practitioner

## 2014-12-06 MED ORDER — NALTREXONE-BUPROPION HCL ER 8-90 MG PO TB12: ORAL_TABLET | ORAL | Status: DC

## 2014-12-20 ENCOUNTER — Telehealth: Payer: Self-pay | Admitting: Family Medicine

## 2014-12-20 NOTE — Telephone Encounter (Signed)
Rx prior auth APPROVED for pt's Naltrexone-Bupropion HCl ER (CONTRAVE) 8-90 MG TB12, valid 12/16/14-04/07/15, faxed approval to [facility]

## 2015-01-05 ENCOUNTER — Encounter: Payer: Self-pay | Admitting: Nurse Practitioner

## 2015-01-05 ENCOUNTER — Ambulatory Visit (INDEPENDENT_AMBULATORY_CARE_PROVIDER_SITE_OTHER): Payer: 59 | Admitting: Nurse Practitioner

## 2015-01-05 VITALS — BP 132/86 | Ht 64.0 in | Wt 177.1 lb

## 2015-01-05 DIAGNOSIS — I10 Essential (primary) hypertension: Secondary | ICD-10-CM

## 2015-01-05 MED ORDER — HYDROCHLOROTHIAZIDE 12.5 MG PO CAPS: ORAL_CAPSULE | ORAL | Status: DC

## 2015-01-05 MED ORDER — AMLODIPINE BESYLATE 10 MG PO TABS: 10.0000 mg | ORAL_TABLET | Freq: Every day | ORAL | Status: DC

## 2015-01-05 NOTE — Progress Notes (Signed)
Subjective:  Presents for recheck on hypertension. Just took her meds. Also used inhaler this am due to weather change. Mild swelling in hands relieved when she takes her fluid pill. Has started Contrave for weight loss. Limited exercise due to busy work and family.   Objective:   BP 132/86 mmHg  Ht  (1.626 m)  Wt 177 lb 2 oz (80.343 kg)  BMI 30.39 kg/m2 NAD. Alert,oriented. Lungs clear. Heart RRR. Mildly elevated heart rate. Lower extremities no edema.   Assessment:  Problem List Items Addressed This Visit      Cardiovascular and Mediastinum   Hypertension - Primary   Relevant Medications   amLODipine (NORVASC) 10 MG tablet   hydrochlorothiazide (MICROZIDE) 12.5 MG capsule     Plan:  Meds ordered this encounter  Medications  . amLODipine (NORVASC) 10 MG tablet    Sig: Take 1 tablet (10 mg total) by mouth daily.    Dispense:  90 tablet    Refill:  1    Order Specific Question:  Supervising Provider    Answer:  Merlyn Albert [[id number]]  . hydrochlorothiazide (MICROZIDE) 12.5 MG capsule    Sig: TAKE 1 CAPSULE (12.5 MG TOTAL) BY MOUTH DAILY.    Dispense:  90 capsule    Refill:  1    Order Specific Question:  Supervising Provider    Answer:  Merlyn Albert [[id number]]   Continue current meds as directed. Encouraged regular exercise.  Return in about 3 months (around 04/07/2015) for recheck. on Contrave and weight loss.

## 2015-01-08 ENCOUNTER — Other Ambulatory Visit: Payer: Self-pay | Admitting: Nurse Practitioner

## 2015-01-13 ENCOUNTER — Telehealth: Payer: Self-pay | Admitting: Adult Health

## 2015-01-13 MED ORDER — ONDANSETRON HCL 8 MG PO TABS: 8.0000 mg | ORAL_TABLET | Freq: Three times a day (TID) | ORAL | Status: DC | PRN

## 2015-01-13 NOTE — Telephone Encounter (Signed)
Pt has nausea requests zofran, will rx

## 2015-04-09 MED FILL — DULoxetine HCL 30 MG CPEP: 30 | 90 days supply | Qty: 90 | Fill #1

## 2015-04-17 ENCOUNTER — Ambulatory Visit (INDEPENDENT_AMBULATORY_CARE_PROVIDER_SITE_OTHER): Payer: 59 | Admitting: Family Medicine

## 2015-04-17 ENCOUNTER — Encounter: Payer: Self-pay | Admitting: Family Medicine

## 2015-04-17 VITALS — BP 132/86 | Ht 64.0 in | Wt 178.6 lb

## 2015-04-17 DIAGNOSIS — I10 Essential (primary) hypertension: Secondary | ICD-10-CM

## 2015-04-17 DIAGNOSIS — Z79899 Other long term (current) drug therapy: Secondary | ICD-10-CM

## 2015-04-17 DIAGNOSIS — Z13220 Encounter for screening for lipoid disorders: Secondary | ICD-10-CM | POA: Diagnosis not present

## 2015-04-17 MED ORDER — AMLODIPINE BESYLATE 10 MG PO TABS: 10.0000 mg | ORAL_TABLET | Freq: Every day | ORAL | Status: DC

## 2015-04-17 NOTE — Progress Notes (Signed)
   Subjective:    Patient ID: Richardson Chiquitoshley M Vivona, female    DOB: Jul 26, 1985, 30 y.o.   MRN: [id number]  Hypertension This is a chronic problem. The current episode started more than 1 year ago. Treatments tried: norvasc. There are no compliance problems.    BP when cked at workplace numbers are generally good,  Handling med well, upper 120s ove 80 r so   Felt a bit out of it when takign meds  Not exrcising much at this time  Soft drinks and sweet tea, partic at home  BP issues for both parent s    Admits to poor dietary compliance. Also admits to minimal exercise with full-time job plus significant family responsibilities  Review of Systems No headache no chest pain no back pain no abdominal pain    Objective:   Physical Exam  Alert vitals stable blood pressure excellent on repeat HEENT normal lungs clear heart regular in rhythm.      Assessment & Plan:  Impression 1 hypertension discussed good control with current meds to maintain same #2 weight loss discussed patient came off meds due to side effects plan meds refilled diet exercise discussed. Appropriate blood work [facility]

## 2015-04-24 ENCOUNTER — Other Ambulatory Visit: Payer: Self-pay | Admitting: Adult Health

## 2015-04-24 ENCOUNTER — Telehealth: Payer: Self-pay | Admitting: Adult Health

## 2015-04-24 DIAGNOSIS — Z79899 Other long term (current) drug therapy: Secondary | ICD-10-CM | POA: Diagnosis not present

## 2015-04-24 DIAGNOSIS — Z13220 Encounter for screening for lipoid disorders: Secondary | ICD-10-CM | POA: Diagnosis not present

## 2015-04-24 DIAGNOSIS — Z139 Encounter for screening, unspecified: Secondary | ICD-10-CM | POA: Diagnosis not present

## 2015-04-24 NOTE — Telephone Encounter (Signed)
Wants labs,TSH and A1c

## 2015-04-25 LAB — BASIC METABOLIC PANEL
BUN/Creatinine Ratio: 14 (ref 8–20)
BUN: 9 mg/dL (ref 6–20)
CO2: 22 mmol/L (ref 18–29)
CREATININE: 0.64 mg/dL (ref 0.57–1.00)
Calcium: 9 mg/dL (ref 8.7–10.2)
Chloride: 105 mmol/L (ref 96–106)
GFR, EST AFRICAN AMERICAN: 138 mL/min/{1.73_m2} (ref >59)
GFR, EST NON AFRICAN AMERICAN: 120 mL/min/{1.73_m2} (ref >59)
Glucose: 90 mg/dL (ref 65–99)
Potassium: 4.2 mmol/L (ref 3.5–5.2)
SODIUM: 143 mmol/L (ref 134–144)

## 2015-04-25 LAB — LIPID PANEL
CHOL/HDL RATIO: 5.2 ratio — AB (ref 0.0–4.4)
Cholesterol, Total: 243 mg/dL — ABNORMAL HIGH (ref 100–199)
HDL: 47 mg/dL (ref >39)
LDL Calculated: 175 mg/dL — ABNORMAL HIGH (ref 0–99)
Triglycerides: 105 mg/dL (ref 0–149)
VLDL Cholesterol Cal: 21 mg/dL (ref 5–40)

## 2015-04-25 LAB — HEMOGLOBIN A1C
Est. average glucose Bld gHb Est-mCnc: 103 mg/dL
Hgb A1c MFr Bld: 5.2 % (ref 4.8–5.6)

## 2015-04-25 LAB — HEPATIC FUNCTION PANEL
ALBUMIN: 4.2 g/dL (ref 3.5–5.5)
ALT: 15 IU/L (ref 0–32)
AST: 16 IU/L (ref 0–40)
Alkaline Phosphatase: 88 IU/L (ref 39–117)
BILIRUBIN TOTAL: 0.7 mg/dL (ref 0.0–1.2)
BILIRUBIN, DIRECT: 0.14 mg/dL (ref 0.00–0.40)
Total Protein: 7.1 g/dL (ref 6.0–8.5)

## 2015-04-25 LAB — TSH: TSH: 2.32 u[IU]/mL (ref 0.450–4.500)

## 2015-04-27 ENCOUNTER — Encounter: Payer: Self-pay | Admitting: Family Medicine

## 2015-05-04 ENCOUNTER — Telehealth: Payer: Self-pay

## 2015-05-13 MED FILL — AMLODIPINE BESYLATE 10 MG T: 10 | 90 days supply | Qty: 90 | Fill #0

## 2015-05-13 MED FILL — VENTOLIN HFA 90 MCG INHALER: 108 (90 BAS | 25 days supply | Qty: 18 | Fill #3

## 2015-05-18 ENCOUNTER — Other Ambulatory Visit: Payer: Self-pay | Admitting: Nurse Practitioner

## 2015-05-18 MED ORDER — PHENTERMINE HCL 37.5 MG PO TABS: 37.5000 mg | ORAL_TABLET | Freq: Every day | ORAL | Status: DC

## 2015-05-18 MED FILL — PHENTERMINE 37.5 MG TABLET: 37.5 | 30 days supply | Qty: 30 | Fill #0

## 2015-05-27 ENCOUNTER — Other Ambulatory Visit: Payer: Self-pay | Admitting: *Deleted

## 2015-05-27 VITALS — BP 118/82 | HR 122 | Ht 64.0 in | Wt 177.0 lb

## 2015-05-27 DIAGNOSIS — R000 Tachycardia, unspecified: Secondary | ICD-10-CM

## 2015-05-27 DIAGNOSIS — I10 Essential (primary) hypertension: Secondary | ICD-10-CM

## 2015-05-27 DIAGNOSIS — E785 Hyperlipidemia, unspecified: Secondary | ICD-10-CM

## 2015-05-27 DIAGNOSIS — E669 Obesity, unspecified: Secondary | ICD-10-CM

## 2015-05-27 NOTE — Patient Outreach (Addendum)
[facility]) Care Management   05/27/2015  Hailey Holden 20-Nov-1985 [id number]  Hailey Holden is an 30 y.o. female who presents to the [facility] Care Management office to enroll in the Link To Wellness program for self management assistance with HTN and hyperlipidemia.  Subjective:  Hailey Holden returns to enroll in the [employer] To Wellness program for assistance with weight loss, HTN and hyperlipidemia. She enroll in the program several years ago but then disenrolled when enrollment in the program was no longer a requirement to receive her hypertension medications at no cost. She says she has had high blood pressure since she was a teenager and started medication in 2012 and it has been in good control with the medication.She says she had blood work done in January and her lipid profile was abnormal as it was in July 2014. Says she was told by her doctor that she is too young to be on cholesterol medication.  She reports that she lost 40 lbs in 2015 via healthy eating and with a short course of phentermine. Over the last two years she has gained back about 25 lbs. She says she is determined to lose weight again and keep it off to keep her blood pressure in good control and to eliminate the need for medication to treat her hyperlipidemia. She is requesting to meet with a registered dietician to help her with healthy meal planning. She says her husband is also overweight and she has 2 children ages 83 and 42 and she wants to help them establish healthy eating habits.  Objective:   Review of Systems  Constitutional: Negative.     Physical Exam  Cardiovascular: Regular rhythm.  Tachycardia present.      Filed Vitals:   05/27/15 0830  BP: 118/82  Pulse: 122   Filed Weights   05/27/15 0830  Weight: 177 lb (80.287 kg)    Current Medications:   Current Outpatient Prescriptions  Medication Sig Dispense Refill  . amLODipine (NORVASC) 10 MG tablet Take 1  tablet (10 mg total) by mouth daily. 90 tablet 1  . cetirizine (ZYRTEC) 10 MG tablet Take 10 mg by mouth daily.    . DULoxetine (CYMBALTA) 30 MG capsule TAKE 1 CAPSULE BY MOUTH DAILY. 90 capsule 1  . etonogestrel-ethinyl estradiol (NUVARING) 0.12-0.015 MG/24HR vaginal ring Place 1 each vaginally every 28 (twenty-eight) days. Insert vaginally and leave in place for 3 consecutive weeks, then remove for 1 week.    . hydrochlorothiazide (MICROZIDE) 12.5 MG capsule TAKE 1 CAPSULE (12.5 MG TOTAL) BY MOUTH DAILY. 90 capsule 1  . phentermine (ADIPEX-P) 37.5 MG tablet Take 1 tablet (37.5 mg total) by mouth daily before breakfast. 30 tablet 2  . montelukast (SINGULAIR) 10 MG tablet Take 1 tablet (10 mg total) by mouth at bedtime. (Patient not taking: Reported on 05/27/2015) 90 tablet 1  . ondansetron (ZOFRAN) 8 MG tablet Take 1 tablet (8 mg total) by mouth every 8 (eight) hours as needed for nausea or vomiting. (Patient not taking: Reported on 05/27/2015) 20 tablet 0  . valACYclovir (VALTREX) 1000 MG tablet TAKE 1 TABLET BY MOUTH DAILY (Patient not taking: Reported on 01/05/2015) 90 tablet 3  . VENTOLIN HFA 108 (90 BASE) MCG/ACT inhaler INHALE 2 PUFFS BY MOUTH EVERY 4 HOURS AS NEEDED FOR WHEEZING (Patient not taking: Reported on 05/27/2015) 18 g 3   No current facility-administered medications for this visit.    Functional Status:   In your present state of  health, do you have any difficulty performing the following activities: 05/27/2015  Hearing? N  Vision? N  Difficulty concentrating or making decisions? N  Walking or climbing stairs? N  Dressing or bathing? N  Doing errands, shopping? N    Fall/Depression Screening:    PHQ 2/9 Scores 05/27/2015  PHQ - 2 Score 0    Assessment:   [employer] employee enrolling in the Link To Wellness program for self management  assistance with weight management, HTN and hyperlipidemia.  Plan:  [facility] CM Care Plan Problem One        Most Recent Value   Care Plan  Problem One  Patient enrolling in the Link To Wellness program for self management assistance with HTN and hyperlipidemia, currently HTN well controlled as evidenced by BP readings consistently <140/<90, lipid profile of 04/24/15 shows elevated total cholesterol at 242 and LDL at 175   Role Documenting the Problem One  Care Management Coordinator   Care Plan for Problem One  Active   [facility] Long Term Goal (31-90 days)  Ongoing good control of HTN as evidenced by home and office BP readings consistently <140/<90 and improvement in lipid profile related to dietary modifications at next check   [facility] Long Term Goal Start Date  05/27/15   Interventions for Problem One Long Term Goal  Discussed Link to Wellness program goals, requirements and benefits, reviewed member's rights and responsibilities ,provided diabetes information packet with explanation of contents, ensured member agreed and signed consent to participate and authorization to release and receive health information, consent, participation agreement and consent to enroll in program, reviewed medication list and discussed medications of amlodipine and HCTZ in regards to mechanism of action , dosage, frequency and common side effects, assessed medication adherence, discussed [place] benefit of free nutritional counseling and with patient's agreement faxed referral for medical nutrition [facility] with [facility], provided hand out on how to improve lipid profile through lifestyle changes,  issued voucher for purchase of home BP monitor for $5 at the OP pharmacy, reviewed how to monitor blood pressure at home, assigned Emmi education modules related to HTN and hyperlipidemia and weight management, encouraged patient to view by completion date and to incorporate information gained through the modules to assist with lifestyle changes and disease management, will arrange for Link To Wellness follow up based on patient  need        RNCM to fax today's office visit note to Dr. Lubertha South RNCM will meet quarterly and as needed with patient per Link To Wellness program guidelines to assist with HTN and hyperlipidemia self-management and assess patient's progress toward mutually set goals.  Bary Richard RN,CCM,CDE [facility] Care Management Coordinator Link To Wellness Office Phone [phone] Office Fax [phone]

## 2015-05-28 ENCOUNTER — Encounter: Payer: Self-pay | Admitting: *Deleted

## 2015-05-28 DIAGNOSIS — R000 Tachycardia, unspecified: Secondary | ICD-10-CM | POA: Insufficient documentation

## 2015-05-28 NOTE — Addendum Note (Signed)
Addended by: Bary Richard on: 05/28/2015 08:58 PM   Modules accepted: Medications

## 2015-05-28 NOTE — Addendum Note (Signed)
Addended by: Bary Richard on: 05/28/2015 05:06 PM   Modules accepted: Orders

## 2015-05-28 NOTE — Addendum Note (Signed)
Addended by: Bary Richard on: 05/28/2015 02:03 PM   Modules accepted: Orders

## 2015-06-04 ENCOUNTER — Encounter: Payer: Self-pay | Admitting: Adult Health

## 2015-06-04 ENCOUNTER — Ambulatory Visit (INDEPENDENT_AMBULATORY_CARE_PROVIDER_SITE_OTHER): Payer: 59 | Admitting: Adult Health

## 2015-06-04 VITALS — BP 140/94 | HR 106 | Ht 64.0 in | Wt 172.0 lb

## 2015-06-04 DIAGNOSIS — L732 Hidradenitis suppurativa: Secondary | ICD-10-CM | POA: Diagnosis not present

## 2015-06-04 MED ORDER — SULFAMETHOXAZOLE-TRIMETHOPRIM 800-160 MG PO TABS: 1.0000 | ORAL_TABLET | Freq: Two times a day (BID) | ORAL | Status: DC

## 2015-06-04 MED FILL — SULFAMETHOXAZOLE/TMP DS TAB: 800-160 | 14 days supply | Qty: 28 | Fill #0

## 2015-06-04 NOTE — Progress Notes (Signed)
Subjective:     Patient ID: Hailey Holden, female   DOB: 1985/10/01, 30 y.o.   MRN: [id number]  HPI Hailey Holden is a 30 year old white female in complaining of early boil like areas inner thighs.Has history of hidradenitis.   Review of Systems + early boils inner thighs, all other systems negative Reviewed past medical,surgical, social and family history. Reviewed medications and allergies.     Objective:   Physical Exam BP 140/94 mmHg  Pulse 106  Ht  (1.626 m)  Wt 172 lb (78.019 kg)  BMI 29.51 kg/m2  LMP    Skin warm and dry, has hidradenitis both inner thighs, worse on left, with several areas that are slightly red and tender, had has several small skin tags, will rx septra ds. Assessment:    Hidradenitis     Plan:    Use bar soap, and don't shave, keep clean and dry  Rx septra ds take 1 bid #28 with 2 refills Follow up prn

## 2015-06-04 NOTE — Patient Instructions (Signed)
Take septra ds bid Follow up prn  Keep clean and dry

## 2015-06-18 MED FILL — PHENTERMINE 37.5 MG TABLET: 37.5 | 30 days supply | Qty: 30 | Fill #1

## 2015-06-18 MED FILL — MONTELUKAST SOD 10 MG TAB: 10 | 90 days supply | Qty: 90 | Fill #1

## 2015-06-19 ENCOUNTER — Encounter: Payer: Self-pay | Admitting: Nutrition

## 2015-06-19 ENCOUNTER — Encounter: Payer: 59 | Attending: Family Medicine | Admitting: Nutrition

## 2015-06-19 VITALS — Ht 64.0 in | Wt 176.0 lb

## 2015-06-19 DIAGNOSIS — E669 Obesity, unspecified: Secondary | ICD-10-CM

## 2015-06-19 DIAGNOSIS — Z713 Dietary counseling and surveillance: Secondary | ICD-10-CM | POA: Diagnosis not present

## 2015-06-19 DIAGNOSIS — E785 Hyperlipidemia, unspecified: Secondary | ICD-10-CM

## 2015-06-19 NOTE — Progress Notes (Signed)
  Medical Nutrition Therapy:  Appt start time: 1500 end time:  1600.  Assessment:  Primary concerns today:  Obesity (BMI 30) and Hyperlipideiai. TCho 245 LDL 175   She cooks . Eats out due to kids sports schedule. Not on any cholesterol med. Family history: Paternal; Grandmother and Grandfather had heart issues.. Nothing on her Mom's side..  She is taking 37.5 of Phentermine. Due to elevated family risk of heart issues and  her elevated lipid profile, would recommend to stop her Phentermine for weight loss.  Isn't following a healthy diet. Diet is excessive in calories, sugar, fat, sodium and low in fresh fruits and vegetables and fiber. Diet is high in processed foods.  Not exercising. Wants to lose weight and improve her cholesterol levels. Needs to be more committed to a low fat, high fiber, lower sodium diet and exercise for needed weight loss and improved lipip profile.  Preferred Learning Style:   Auditory  Visual  Hands on    Learning Readiness:   Ready  Change in progress   MEDICATIONS:    DIETARY INTAKE:  24-hr recall:  B ( AM): Skips OR Bacon - 3 slices, Mt Dew Snk ( AM): none  L ( PM): 1 Cup oodles and noodles.OR  Toss salad  Cheese, pickles, veggies and ranch dressing, 32 oz water Snk ( PM): none D ( PM): Cheeseburger from [employer] 1/2, swee tea  Snk ( PM): Lucky charms  2 cups,  Milk- 2% milk Beverages: water, soda,   Usual physical activity:  ADL  Estimated energy needs: 1500 calories 170 g carbohydrates 112 g protein 42 g fat  Progress Towards Goal(s):  In progress.   Nutritional Diagnosis:  NB-1.1 Food and nutrition-related knowledge deficit As related to OBesity and Hyperlipidemia.  As evidenced by  BMI.    Intervention:  Healthy weight loss tips, My  Plate, portion sizes, reading food labels, avoiding snacks, cutting out processed foods and increasing fiber. Focus of fresh fruits, vegetables,whole grains and lean cuts of meat. Exercise 30 mintutes 5  days per week and increase as tolerated. Drink only water.  Goals 1. Cut out sodas 2. Eat breakfast daily. 3. Reduce meals eaten out- Eat out only twice a week, 4. Walk 30 minutes daily. 5. Talk to MD about your cholesterol and stopping weight loss meds. 6. Follow the Plate Method. 7. Increase fresh fruits, vegetables and whole grains. 8. Cu tout fried foods and processed foods.   Teaching Method Utilized:  Visual Auditory Hands on  Handouts given during visit include:  The Plate Method  Meal Plan Card  Low Fat High Fiber Diet  Barriers to learning/adherence to lifestyle change: None  Demonstrated degree of understanding via:  Teach Back   Monitoring/Evaluation:  Dietary intake, exercise, meal planning, and body weight in 1 month(s).

## 2015-06-19 NOTE — Patient Instructions (Signed)
Goals 1. Cut out sodas 2. Eat breakfast 3. Reduce meals eaten out- Eat out only twice a week, 4. Walk 30 minutes daily. 5. Talk to MD about your cholesterol and weight loss meds. 6. Follow the Plate Method

## 2015-07-02 ENCOUNTER — Other Ambulatory Visit: Payer: Self-pay | Admitting: Advanced Practice Midwife

## 2015-07-02 MED ORDER — ETONOGESTREL-ETHINYL ESTRADIOL 0.12-0.015 MG/24HR VA RING: VAGINAL_RING | VAGINAL | Status: DC

## 2015-07-08 ENCOUNTER — Other Ambulatory Visit: Payer: Self-pay | Admitting: Nurse Practitioner

## 2015-07-08 MED ORDER — DULOXETINE HCL 30 MG PO CPEP: 30.0000 mg | ORAL_CAPSULE | Freq: Every day | ORAL | Status: DC

## 2015-07-08 MED FILL — HYDROCHLOROTHIAZIDE 12.5 MG: 12.5 | 90 days supply | Qty: 90 | Fill #0

## 2015-07-08 MED FILL — DULoxetine HCL 30 MG CPEP: 30 | 90 days supply | Qty: 90 | Fill #0

## 2015-07-31 ENCOUNTER — Encounter: Payer: 59 | Admitting: Nutrition

## 2015-08-14 MED FILL — AMLODIPINE BESYLATE 10 MG T: 10 | 90 days supply | Qty: 90 | Fill #1

## 2015-08-14 MED FILL — PHENTERMINE 37.5 MG TABLET: 37.5 | 30 days supply | Qty: 30 | Fill #2

## 2015-08-25 ENCOUNTER — Ambulatory Visit (INDEPENDENT_AMBULATORY_CARE_PROVIDER_SITE_OTHER): Payer: 59

## 2015-08-25 ENCOUNTER — Other Ambulatory Visit: Payer: Self-pay | Admitting: *Deleted

## 2015-08-25 ENCOUNTER — Ambulatory Visit (INDEPENDENT_AMBULATORY_CARE_PROVIDER_SITE_OTHER): Payer: 59 | Admitting: Orthopedic Surgery

## 2015-08-25 VITALS — BP 151/98 | HR 131 | Ht 64.0 in | Wt 180.0 lb

## 2015-08-25 DIAGNOSIS — M25571 Pain in right ankle and joints of right foot: Secondary | ICD-10-CM

## 2015-08-25 DIAGNOSIS — M76829 Posterior tibial tendinitis, unspecified leg: Secondary | ICD-10-CM

## 2015-08-25 DIAGNOSIS — M6789 Other specified disorders of synovium and tendon, multiple sites: Secondary | ICD-10-CM

## 2015-08-25 MED ORDER — DICLOFENAC POTASSIUM 50 MG PO TABS: 50.0000 mg | ORAL_TABLET | Freq: Two times a day (BID) | ORAL | Status: DC

## 2015-08-25 MED ORDER — ALBUTEROL SULFATE HFA 108 (90 BASE) MCG/ACT IN AERS: INHALATION_SPRAY | RESPIRATORY_TRACT | Status: DC

## 2015-08-25 MED FILL — DICLOFENAC POT 50 MG TABLET: 50 | 30 days supply | Qty: 60 | Fill #0

## 2015-08-25 NOTE — Patient Instructions (Signed)
BOOT X 6 WEEKS, ICE 20 MINUTES MEDICATION SENT TO YOUR PHARMACY

## 2015-08-25 NOTE — Progress Notes (Signed)
Chief Complaint  Patient presents with  . Ankle Pain    Right ankle pain   HPI eval for right ankle pain several days no trauma   Medial and some lateral ankle pain no injury  Pain is: Moderate Dull Constant Worse with weight bearing  assoc with swelling   Review of Systems  Constitutional: Negative for fever and chills.  Respiratory: Negative for cough.   Cardiovascular: Negative for chest pain.    Past Medical History  Diagnosis Date  . Asthma   . High cholesterol   . Hypertension   . Enlarged thyroid 09/27/2012  . PMS (premenstrual syndrome) 09/27/2012  . Migraine headache   . Hidradenitis   . Constipation   . Breast nodule 02/04/2014  . Uterine prolapse 10/21/2014  . Encounter for other contraceptive management 10/21/2014  . Tachycardia     patient states she has long history of resting tachycardia    No past surgical history on file. Family History  Problem Relation Age of Onset  . Diabetes Mother   . Hypertension Mother   . Fibromyalgia Mother   . Heart disease Father   . Hypertension Father   . Hyperlipidemia Father   . Hypertension Maternal Grandmother   . Vision loss Maternal Grandmother   . Glaucoma Maternal Grandmother   . Cancer Maternal Grandfather     melonoma  . Heart disease Paternal Grandmother   . Hypertension Paternal Grandmother   . Hyperlipidemia Paternal Grandmother   . Other Paternal Grandmother     macular degeneration  . Cancer Paternal Grandfather     lung  . Cancer Maternal Uncle     lung   Social History  Substance Use Topics  . Smoking status: Never Smoker   . Smokeless tobacco: Never Used  . Alcohol Use: No    Current outpatient prescriptions:  .  albuterol (VENTOLIN HFA) 108 (90 Base) MCG/ACT inhaler, INHALE 2 PUFFS BY MOUTH EVERY 4 HOURS AS NEEDED FOR WHEEZING, Disp: 18 g, Rfl: 3 .  amLODipine (NORVASC) 10 MG tablet, Take 1 tablet (10 mg total) by mouth daily., Disp: 90 tablet, Rfl: 1 .  DULoxetine (CYMBALTA) 30 MG  capsule, Take 1 capsule (30 mg total) by mouth daily., Disp: 90 capsule, Rfl: 1 .  etonogestrel-ethinyl estradiol (NUVARING) 0.12-0.015 MG/24HR vaginal ring, Insert vaginally and leave in place for 3 consecutive weeks, then remove for 1 week., Disp: 1 each, Rfl: 11 .  hydrochlorothiazide (MICROZIDE) 12.5 MG capsule, TAKE 1 CAPSULE (12.5 MG TOTAL) BY MOUTH DAILY., Disp: 90 capsule, Rfl: 1 .  diclofenac (CATAFLAM) 50 MG tablet, Take 1 tablet (50 mg total) by mouth 2 (two) times daily., Disp: 60 tablet, Rfl: 0 .  phentermine (ADIPEX-P) 37.5 MG tablet, Take 1 tablet (37.5 mg total) by mouth daily before breakfast. (Patient not taking: Reported on 08/25/2015), Disp: 30 tablet, Rfl: 2 .  sulfamethoxazole-trimethoprim (BACTRIM DS,SEPTRA DS) 800-160 MG tablet, Take 1 tablet by mouth 2 (two) times daily. (Patient not taking: Reported on 08/25/2015), Disp: 28 tablet, Rfl: 2 .  valACYclovir (VALTREX) 1000 MG tablet, TAKE 1 TABLET BY MOUTH DAILY (Patient not taking: Reported on 08/25/2015), Disp: 90 tablet, Rfl: 3  BP 151/98 mmHg  Pulse 131  Ht 5\' 4"  (1.626 m)  Wt 180 lb (81.647 kg)  BMI 30.88 kg/m2  Physical Exam  Constitutional: She is oriented to person, place, and time. She appears well-developed and well-nourished. No distress.  Cardiovascular: Normal rate and intact distal pulses.   Neurological: She is alert and  oriented to person, place, and time. She has normal reflexes. She exhibits normal muscle tone. Coordination normal.  Skin: Skin is warm and dry. No rash noted. She is not diaphoretic. No erythema. No pallor.  Psychiatric: She has a normal mood and affect. Her behavior is normal. Judgment and thought content normal.    Ortho Exam Left ankle  Appears normal no tenderness rom is normal  strenght 5/5 DF PF  Stability , drawer test normal   Right ankle  Tender PTT, swelling strength normal  Drawer test normal rom is normal  Single leg rise normal   ASSESSMENT: My personal  interpretation of the images:  3 v right ankle were normasl     PLAN PTTD grade 1   rec cam walker 6 weeks   Meds ordered this encounter  Medications  . diclofenac (CATAFLAM) 50 MG tablet    Sig: Take 1 tablet (50 mg total) by mouth 2 (two) times daily.    Dispense:  60 tablet    Refill:  0

## 2015-08-28 ENCOUNTER — Ambulatory Visit (INDEPENDENT_AMBULATORY_CARE_PROVIDER_SITE_OTHER): Payer: 59 | Admitting: Nurse Practitioner

## 2015-08-28 ENCOUNTER — Encounter: Payer: Self-pay | Admitting: Nurse Practitioner

## 2015-08-28 VITALS — BP 130/86 | Ht 64.0 in | Wt 182.2 lb

## 2015-08-28 DIAGNOSIS — M791 Myalgia: Secondary | ICD-10-CM | POA: Diagnosis not present

## 2015-08-28 DIAGNOSIS — M609 Myositis, unspecified: Secondary | ICD-10-CM | POA: Diagnosis not present

## 2015-08-28 DIAGNOSIS — IMO0001 Reserved for inherently not codable concepts without codable children: Secondary | ICD-10-CM

## 2015-08-28 DIAGNOSIS — R5383 Other fatigue: Secondary | ICD-10-CM | POA: Diagnosis not present

## 2015-08-28 DIAGNOSIS — M2550 Pain in unspecified joint: Secondary | ICD-10-CM | POA: Diagnosis not present

## 2015-08-28 NOTE — Progress Notes (Signed)
Subjective:  Presents to discuss ongoing problems with extreme diffuse pain and fatigue. States it comes and goes in waves. Will have a very brief time where she fills better with more energy but does not last long. For example had pain in both thigh areas today pulling up her pains. Cannot stand have pressure on some of her muscles. Also some joint pain. No fever or rash. Currently on Cymbalta 30 mg daily. Continues to have significant stress but this is unchanged.  Objective:   BP 130/86 mmHg  Ht 5\' 4"  (1.626 m)  Wt 182 lb 3.2 oz (82.645 kg)  BMI 31.26 kg/m2 NAD. Alert, oriented. Lungs clear. Heart regular rate rhythm.  Assessment: Myalgia and myositis - Plan: Rheumatoid Factor, Antinuclear Antib (ANA), B. Burgdorfi Antibodies, Sedimentation rate  Arthralgia - Plan: Rheumatoid Factor, Antinuclear Antib (ANA), B. Burgdorfi Antibodies, Sedimentation rate  Other fatigue - Plan: Rheumatoid Factor, Antinuclear Antib (ANA), B. Burgdorfi Antibodies, Sedimentation rate  Plan: Lab work pending. May need referral to rheumatologist for possible fibromyalgia. Increase Cymbalta to 60 mg daily. Go back to 30 mg dose and notify office if any problems.

## 2015-08-28 NOTE — Patient Instructions (Signed)
Zaditor eye drops

## 2015-08-31 LAB — B. BURGDORFI ANTIBODIES: Lyme IgG/IgM Ab: <0.91 {ISR} (ref 0.00–0.90)

## 2015-08-31 LAB — RHEUMATOID FACTOR

## 2015-08-31 LAB — SEDIMENTATION RATE: SED RATE: 28 mm/h (ref 0–32)

## 2015-08-31 LAB — ANA: Anti Nuclear Antibody(ANA): NEGATIVE

## 2015-09-15 ENCOUNTER — Other Ambulatory Visit: Payer: Self-pay | Admitting: Nurse Practitioner

## 2015-09-15 MED ORDER — DULOXETINE HCL 60 MG PO CPEP: 60.0000 mg | ORAL_CAPSULE | Freq: Every day | ORAL | Status: DC

## 2015-09-15 MED FILL — DULoxetine HCL 60 MG CPEP: 60 | 90 days supply | Qty: 90 | Fill #0

## 2015-09-17 ENCOUNTER — Other Ambulatory Visit: Payer: Self-pay | Admitting: *Deleted

## 2015-09-17 MED ORDER — ALBUTEROL SULFATE HFA 108 (90 BASE) MCG/ACT IN AERS: INHALATION_SPRAY | RESPIRATORY_TRACT | Status: DC

## 2015-09-17 MED ORDER — MONTELUKAST SODIUM 10 MG PO TABS: 10.0000 mg | ORAL_TABLET | Freq: Every day | ORAL | Status: DC

## 2015-09-17 MED FILL — VENTOLIN HFA 90 MCG INHALER: 108 (90 BAS | 25 days supply | Qty: 18 | Fill #0

## 2015-09-17 MED FILL — MONTELUKAST SOD 10 MG TAB: 10 | 90 days supply | Qty: 90 | Fill #0

## 2015-10-07 ENCOUNTER — Ambulatory Visit (INDEPENDENT_AMBULATORY_CARE_PROVIDER_SITE_OTHER): Payer: 59 | Admitting: Orthopedic Surgery

## 2015-10-07 ENCOUNTER — Encounter: Payer: Self-pay | Admitting: Orthopedic Surgery

## 2015-10-07 VITALS — BP 126/80 | HR 121 | Ht 64.0 in | Wt 182.0 lb

## 2015-10-07 DIAGNOSIS — M25571 Pain in right ankle and joints of right foot: Secondary | ICD-10-CM | POA: Diagnosis not present

## 2015-10-07 DIAGNOSIS — M76829 Posterior tibial tendinitis, unspecified leg: Secondary | ICD-10-CM

## 2015-10-07 DIAGNOSIS — M6789 Other specified disorders of synovium and tendon, multiple sites: Secondary | ICD-10-CM

## 2015-10-07 MED FILL — HYDROCHLOROTHIAZIDE 12.5 MG: 12.5 | 90 days supply | Qty: 90 | Fill #1

## 2015-10-07 NOTE — Progress Notes (Signed)
Chief complaint right ankle and foot pain  Patient treated for posterior tibial tendon dysfunction with a Cam Walker. She is a Engineer, civil (consulting)nurse. She is already remove the brace and walking without it. 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 She can remove the boot as she is artery done that and walked normally and functioned well  Follow-up if any problems

## 2015-10-07 NOTE — Patient Instructions (Addendum)
Exercises at home    Achilles Tendinitis With Rehab Achilles tendinitis is a disorder of the Achilles tendon. The Achilles tendon connects the large calf muscles (Gastrocnemius and Soleus) to the heel bone (calcaneus). This tendon is sometimes called the heel cord. It is important for pushing-off and standing on your toes and is important for walking, running, or jumping. Tendinitis is often caused by overuse and repetitive microtrauma. SYMPTOMS  Pain, tenderness, swelling, warmth, and redness may occur over the Achilles tendon even at rest.  Pain with pushing off, or flexing or extending the ankle.  Pain that is worsened after or during activity. CAUSES   Overuse sometimes seen with rapid increase in exercise programs or in sports requiring running and jumping.  Poor physical conditioning (strength and flexibility or endurance).  Running sports, especially training running down hills.  Inadequate warm-up before practice or play or failure to stretch before participation.  Injury to the tendon. PREVENTION   Warm up and stretch before practice or competition.  Allow time for adequate rest and recovery between practices and competition.  Keep up conditioning.  Keep up ankle and leg flexibility.  Improve or keep muscle strength and endurance.  Improve cardiovascular fitness.  Use proper technique.  Use proper equipment (shoes, skates).  To help prevent recurrence, taping, protective strapping, or an adhesive bandage may be recommended for several weeks after healing is complete. PROGNOSIS   Recovery may take weeks to several months to heal.  Longer recovery is expected if symptoms have been prolonged.  Recovery is usually quicker if the inflammation is due to a direct blow as compared with overuse or sudden strain. RELATED COMPLICATIONS   Healing time will be prolonged if the condition is not correctly treated. The injury must be given plenty of time to  heal.  Symptoms can reoccur if activity is resumed too soon.  Untreated, tendinitis may increase the risk of tendon rupture requiring additional time for recovery and possibly surgery. TREATMENT   The first treatment consists of rest anti-inflammatory medication, and ice to relieve the pain.  Stretching and strengthening exercises after resolution of pain will likely help reduce the risk of recurrence. Referral to a physical therapist or athletic trainer for further evaluation and treatment may be helpful.  A walking boot or cast may be recommended to rest the Achilles tendon. This can help break the cycle of inflammation and microtrauma.  Arch supports (orthotics) may be prescribed or recommended by your caregiver as an adjunct to therapy and rest.  Surgery to remove the inflamed tendon lining or degenerated tendon tissue is rarely necessary and has shown less than predictable results. MEDICATION   Nonsteroidal anti-inflammatory medications, such as aspirin and ibuprofen, may be used for pain and inflammation relief. Do not take within 7 days before surgery. Take these as directed by your caregiver. Contact your caregiver immediately if any bleeding, stomach upset, or signs of allergic reaction occur. Other minor pain relievers, such as acetaminophen, may also be used.  Pain relievers may be prescribed as necessary by your caregiver. Do not take prescription pain medication for longer than 4 to 7 days. Use only as directed and only as much as you need.  Cortisone injections are rarely indicated. Cortisone injections may weaken tendons and predispose to rupture. It is better to give the condition more time to heal than to use them. HEAT AND COLD  Cold is used to relieve pain and reduce inflammation for acute and chronic Achilles tendinitis. Cold should be applied  for 10 to 15 minutes every 2 to 3 hours for inflammation and pain and immediately after any activity that aggravates your  symptoms. Use ice packs or an ice massage.  Heat may be used before performing stretching and strengthening activities prescribed by your caregiver. Use a heat pack or a warm soak. SEEK MEDICAL CARE IF:  Symptoms get worse or do not improve in 2 weeks despite treatment.  New, unexplained symptoms develop. Drugs used in treatment may produce side effects. EXERCISES RANGE OF MOTION (ROM) AND STRETCHING EXERCISES - Achilles Tendinitis  These exercises may help you when beginning to rehabilitate your injury. Your symptoms may resolve with or without further involvement from your physician, physical therapist or athletic trainer. While completing these exercises, remember:   Restoring tissue flexibility helps normal motion to return to the joints. This allows healthier, less painful movement and activity.  An effective stretch should be held for at least 30 seconds.  A stretch should never be painful. You should only feel a gentle lengthening or release in the stretched tissue. STRETCH - Gastroc, Standing   Place hands on wall.  Extend right / left leg, keeping the front knee somewhat bent.  Slightly point your toes inward on your back foot.  Keeping your right / left heel on the floor and your knee straight, shift your weight toward the wall, not allowing your back to arch.  You should feel a gentle stretch in the right / left calf. Hold this position for ____3______ seconds. Repeat _____15_____ times. Complete this stretch ____1______ times per day. STRETCH - Soleus, Standing   Place hands on wall.  Extend right / left leg, keeping the other knee somewhat bent.  Slightly point your toes inward on your back foot.  Keep your right / left heel on the floor, bend your back knee, and slightly shift your weight over the back leg so that you feel a gentle stretch deep in your back calf.  Hold this position for __________ seconds. Repeat __________ times. Complete this stretch __________  times per day. STRETCH - Gastrocsoleus, Standing  Note: This exercise can place a lot of stress on your foot and ankle. Please complete this exercise only if specifically instructed by your caregiver.   Place the ball of your right / left foot on a step, keeping your other foot firmly on the same step.  Hold on to the wall or a rail for balance.  Slowly lift your other foot, allowing your body weight to press your heel down over the edge of the step.  You should feel a stretch in your right / left calf.  Hold this position for __________ seconds.  Repeat this exercise with a slight bend in your knee. Repeat __________ times. Complete this stretch __________ times per day.  STRENGTHENING EXERCISES - Achilles Tendinitis These exercises may help you when beginning to rehabilitate your injury. They may resolve your symptoms with or without further involvement from your physician, physical therapist or athletic trainer. While completing these exercises, remember:   Muscles can gain both the endurance and the strength needed for everyday activities through controlled exercises.  Complete these exercises as instructed by your physician, physical therapist or athletic trainer. Progress the resistance and repetitions only as guided.  You may experience muscle soreness or fatigue, but the pain or discomfort you are trying to eliminate should never worsen during these exercises. If this pain does worsen, stop and make certain you are following the directions exactly. If the  pain is still present after adjustments, discontinue the exercise until you can discuss the trouble with your clinician. STRENGTH - Plantar-flexors   Sit with your right / left leg extended. Holding onto both ends of a rubber exercise band/tubing, loop it around the ball of your foot. Keep a slight tension in the band.  Slowly push your toes away from you, pointing them downward.  Hold this position for __________ seconds.  Return slowly, controlling the tension in the band/tubing. Repeat __________ times. Complete this exercise __________ times per day.  STRENGTH - Plantar-flexors   Stand with your feet shoulder width apart. Steady yourself with a wall or table using as little support as needed.  Keeping your weight evenly spread over the width of your feet, rise up on your toes.*  Hold this position for __________ seconds. Repeat __________ times. Complete this exercise __________ times per day.  *If this is too easy, shift your weight toward your right / left leg until you feel challenged. Ultimately, you may be asked to do this exercise with your right / left foot only. STRENGTH - Plantar-flexors, Eccentric  Note: This exercise can place a lot of stress on your foot and ankle. Please complete this exercise only if specifically instructed by your caregiver.   Place the balls of your feet on a step. With your hands, use only enough support from a wall or rail to keep your balance.  Keep your knees straight and rise up on your toes.  Slowly shift your weight entirely to your right / left toes and pick up your opposite foot. Gently and with controlled movement, lower your weight through your right / left foot so that your heel drops below the level of the step. You will feel a slight stretch in the back of your calf at the end position.  Use the healthy leg to help rise up onto the balls of both feet, then lower weight only on the right / left leg again. Build up to 15 repetitions. Then progress to 3 consecutive sets of 15 repetitions.*  After completing the above exercise, complete the same exercise with a slight knee bend (about 30 degrees). Again, build up to 15 repetitions. Then progress to 3 consecutive sets of 15 repetitions.* Perform this exercise __________ times per day.  *When you easily complete 3 sets of 15, your physician, physical therapist or athletic trainer may advise you to add resistance by  wearing a backpack filled with additional weight. STRENGTH - Plantar Flexors, Seated   Sit on a chair that allows your feet to rest flat on the ground. If necessary, sit at the edge of the chair.  Keeping your toes firmly on the ground, lift your right / left heel as far as you can without increasing any discomfort in your ankle. Repeat __________ times. Complete this exercise __________ times a day. *If instructed by your physician, physical therapist or athletic trainer, you may add ____________________ of resistance by placing a weighted object on your right / left knee.   This information is not intended to replace advice given to you by your health care provider. Make sure you discuss any questions you have with your health care provider.   Document Released: 10/20/2004 Document Revised: 04/11/2014 Document Reviewed: 07/03/2008 Elsevier Interactive Patient Education [employer].

## 2015-10-08 ENCOUNTER — Ambulatory Visit (INDEPENDENT_AMBULATORY_CARE_PROVIDER_SITE_OTHER): Payer: 59 | Admitting: Adult Health

## 2015-10-08 ENCOUNTER — Encounter: Payer: Self-pay | Admitting: Adult Health

## 2015-10-08 VITALS — BP 140/80 | HR 96 | Ht 64.0 in | Wt 182.0 lb

## 2015-10-08 DIAGNOSIS — B379 Candidiasis, unspecified: Secondary | ICD-10-CM

## 2015-10-08 DIAGNOSIS — N898 Other specified noninflammatory disorders of vagina: Secondary | ICD-10-CM | POA: Diagnosis not present

## 2015-10-08 HISTORY — DX: Other specified noninflammatory disorders of vagina: N89.8

## 2015-10-08 HISTORY — DX: Candidiasis, unspecified: B37.9

## 2015-10-08 LAB — POCT WET PREP (WET MOUNT)

## 2015-10-08 MED ORDER — FLUCONAZOLE 150 MG PO TABS: ORAL_TABLET | ORAL | Status: DC

## 2015-10-08 MED ORDER — NYSTATIN-TRIAMCINOLONE 100000-0.1 UNIT/GM-% EX CREA: 1.0000 "application " | TOPICAL_CREAM | Freq: Two times a day (BID) | CUTANEOUS | Status: DC

## 2015-10-08 NOTE — Patient Instructions (Signed)
Use mytrex and take diflucan  Follow up prn

## 2015-10-08 NOTE — Progress Notes (Signed)
Subjective:     Patient ID: Hailey Holden, female   DOB: 06/22/1985, 30 y.o.   MRN: [id number]  HPI Hailey Holden is a 30 year old white female, married, worked in complaining of vaginal irritation and some pain with sitting, [facility] and then used new soap. Used monistat on outside yesterday.   Review of Systems + vaginal irritation Reviewed past medical,surgical, social and family history. Reviewed medications and allergies.     Objective:   Physical Exam BP 140/80 mmHg  Pulse 96  Ht 5\' 4"  (1.626 m)  Wt 182 lb (82.555 kg)  BMI 31.22 kg/m2  LMP  Skin warm and dry.Pelvic: external genitalia is slightly red and swollen and has several areas of excoriation, vagina: scant white discharge without odor,sidewalls red,urethra has no lesions or masses noted,nuva ring in place, cervix is everted at os and bulbous, uterus: normal size, shape and contour, non tender, no masses felt, adnexa: no masses or tenderness noted. Bladder is non tender and no masses felt. Wet prep: + for yeast buds and +WBCs.     Assessment:     Vaginal irritation Yeast infection     Plan:     Rx diflucan 150 mg #2 take 1 now and 1 in 3 days, with 1 refill Rx mytrex cream use tid prn #30 gm Follow up prn

## 2015-10-27 ENCOUNTER — Ambulatory Visit (INDEPENDENT_AMBULATORY_CARE_PROVIDER_SITE_OTHER): Payer: 59 | Admitting: Adult Health

## 2015-10-27 ENCOUNTER — Encounter: Payer: Self-pay | Admitting: Adult Health

## 2015-10-27 VITALS — BP 120/82 | HR 100 | Ht 64.0 in | Wt 184.0 lb

## 2015-10-27 DIAGNOSIS — E049 Nontoxic goiter, unspecified: Secondary | ICD-10-CM

## 2015-10-27 DIAGNOSIS — Z01411 Encounter for gynecological examination (general) (routine) with abnormal findings: Secondary | ICD-10-CM | POA: Diagnosis not present

## 2015-10-27 DIAGNOSIS — Z01419 Encounter for gynecological examination (general) (routine) without abnormal findings: Secondary | ICD-10-CM

## 2015-10-27 DIAGNOSIS — Z308 Encounter for other contraceptive management: Secondary | ICD-10-CM

## 2015-10-27 NOTE — Progress Notes (Signed)
Patient ID: Hailey Holden, female   DOB: May 11, 1985, 30 y.o.   MRN: [id number] History of Present Illness: Hailey Holden is a 30 year old white female, married in for a well woman gyn exam,she had a normal pap with negative HPV 10/21/14. PCP is Hailey Don, FNP.   Current Medications, Allergies, Past Medical History, Past Surgical History, Family History and Social History were reviewed in [employer] record.     Review of Systems: Patient denies any headaches, hearing loss, fatigue, blurred vision, shortness of breath, chest pain, abdominal pain, problems with bowel movements(constipated at times), urination, or intercourse. No joint pain or mood swings. She is happy with her nuva ring.   Physical Exam:BP 120/82 (BP Location: Left Arm, Patient Position: Sitting, Cuff Size: Normal)   Pulse 100   Ht 5\' 4"  (1.626 m)   Wt 184 lb (83.5 kg)   BMI 31.58 kg/m  General:  Well developed, well nourished, no acute distress Skin:  Warm and dry Neck:  Midline trachea, slightly enlarged thyroid, good ROM, no lymphadenopathy Lungs; Clear to auscultation bilaterally Breast:  No dominant palpable mass, retraction, or nipple discharge Cardiovascular: Regular rate and rhythm Abdomen:  Soft, non tender, no hepatosplenomegaly Pelvic:  External genitalia is normal in appearance, no lesions.  The vagina is normal in appearance. Urethra has no lesions or masses. The cervix is bulbous and slightly everted at the os, nuva ring in place.  Uterus is felt to be normal size, shape, and contour.  No adnexal masses or tenderness noted.Bladder is non tender, no masses felt. Extremities/musculoskeletal:  No swelling or varicosities noted, no clubbing or cyanosis Psych:  No mood changes, alert and cooperative,seems happy. She had labs earlier in the year, and had thyroid US in 2013, since stable will not re scan as of yet.    Impression: Well woman gyn exam no pap Enlarged thyroid Contraceptive  management     Plan: Continue nuva ring Follow up in 1 year for physical

## 2015-10-27 NOTE — Patient Instructions (Signed)
Follow-up in 1 year for physical

## 2015-10-28 ENCOUNTER — Ambulatory Visit: Payer: Self-pay | Admitting: *Deleted

## 2015-11-17 ENCOUNTER — Other Ambulatory Visit: Payer: Self-pay | Admitting: Adult Health

## 2015-11-17 MED ORDER — AMLODIPINE BESYLATE 10 MG PO TABS: 10.0000 mg | ORAL_TABLET | Freq: Every day | ORAL | 3 refills | Status: DC

## 2015-11-17 MED FILL — AMLODIPINE BESYLATE 10 MG T: 10 | 90 days supply | Qty: 90 | Fill #0

## 2015-12-01 ENCOUNTER — Other Ambulatory Visit: Payer: Self-pay | Admitting: *Deleted

## 2015-12-01 NOTE — Patient Outreach (Signed)
Hailey Holden was unable to keep her July 26th [facility] HTN appointment due to her work schedule. [facility] the option of disenrolling from the program because it will be very difficult for her to take time off from work.  She has a home BP monitor and the information packet on self management of HTN,a dn she viewed the HTN Emmi videos assigned to her on HTN self management.  that was given to her at the initial [facility] appointment. She decided that she will voluntarily disenroll and understands  she can continue to see a registered dietician  at no cost under her [place] [employer] benefit or contact this RNCM if she wishes to reenroll in the program in the future. Will close case today. Bary RichardJanet S. Sadler Teschner RN,CCM,CDE [facility] Care Management Coordinator [facility] Office Phone [phone] Office Fax [phone]

## 2015-12-03 ENCOUNTER — Encounter: Payer: Self-pay | Admitting: Nurse Practitioner

## 2015-12-22 MED FILL — DULoxetine HCL 60 MG CPEP: 60 | 90 days supply | Qty: 90 | Fill #1

## 2015-12-22 MED FILL — MONTELUKAST SOD 10 MG TAB: 10 | 90 days supply | Qty: 90 | Fill #1

## 2015-12-25 ENCOUNTER — Other Ambulatory Visit: Payer: Self-pay | Admitting: Nurse Practitioner

## 2015-12-25 MED ORDER — PHENTERMINE HCL 37.5 MG PO TABS: 37.5000 mg | ORAL_TABLET | Freq: Every day | ORAL | 2 refills | Status: DC

## 2015-12-25 MED FILL — PHENTERMINE 37.5 MG TABLET: 37.5 | 30 days supply | Qty: 30 | Fill #0

## 2016-01-05 ENCOUNTER — Other Ambulatory Visit: Payer: Self-pay | Admitting: Nurse Practitioner

## 2016-01-05 MED FILL — HYDROCHLOROTHIAZIDE 12.5 MG: 12.5 | 90 days supply | Qty: 90 | Fill #0

## 2016-02-04 MED FILL — VENTOLIN HFA 90 MCG INHALER: 108 (90 BAS | 25 days supply | Qty: 18 | Fill #1

## 2016-02-08 DIAGNOSIS — H52223 Regular astigmatism, bilateral: Secondary | ICD-10-CM | POA: Diagnosis not present

## 2016-02-08 DIAGNOSIS — H5213 Myopia, bilateral: Secondary | ICD-10-CM | POA: Diagnosis not present

## 2016-02-18 MED FILL — AMLODIPINE BESYLATE 10 MG T: 10 | 90 days supply | Qty: 90 | Fill #1

## 2016-02-18 MED FILL — PHENTERMINE 37.5 MG TABLET: 37.5 | 30 days supply | Qty: 30 | Fill #1

## 2016-03-21 ENCOUNTER — Other Ambulatory Visit: Payer: Self-pay | Admitting: Family Medicine

## 2016-03-21 ENCOUNTER — Other Ambulatory Visit: Payer: Self-pay | Admitting: Nurse Practitioner

## 2016-03-21 MED FILL — MONTELUKAST SOD 10 MG TAB: 10 | 90 days supply | Qty: 90 | Fill #0

## 2016-03-21 MED FILL — DULoxetine HCL 60 MG CPEP: 60 | 90 days supply | Qty: 90 | Fill #0

## 2016-04-14 ENCOUNTER — Other Ambulatory Visit: Payer: Self-pay | Admitting: Family Medicine

## 2016-04-14 MED FILL — HYDROCHLOROTHIAZIDE 12.5 MG: 12.5 | 30 days supply | Qty: 30 | Fill #0

## 2016-04-14 NOTE — Telephone Encounter (Signed)
Patient last seen 04/17/15 for AEX. Refill ok?

## 2016-04-14 NOTE — Telephone Encounter (Signed)
Patient may have one refill needs office visit 

## 2016-05-16 ENCOUNTER — Other Ambulatory Visit: Payer: Self-pay | Admitting: Adult Health

## 2016-05-16 MED ORDER — HYDROCHLOROTHIAZIDE 12.5 MG PO CAPS: 12.5000 mg | ORAL_CAPSULE | Freq: Every day | ORAL | 3 refills | Status: DC

## 2016-05-16 MED FILL — HYDROCHLOROTHIAZIDE 12.5 MG: 12.5 | 90 days supply | Qty: 90 | Fill #0

## 2016-05-16 MED FILL — PHENTERMINE 37.5 MG TABLET: 37.5 | 30 days supply | Qty: 30 | Fill #2

## 2016-05-30 MED FILL — AMLODIPINE BESYLATE 10 MG T: 10 | 90 days supply | Qty: 90 | Fill #2

## 2016-06-23 ENCOUNTER — Other Ambulatory Visit: Payer: Self-pay | Admitting: Family Medicine

## 2016-06-23 MED FILL — DULoxetine HCL 60 MG CPEP: 60 | 90 days supply | Qty: 90 | Fill #0

## 2016-07-18 ENCOUNTER — Other Ambulatory Visit: Payer: Self-pay | Admitting: Family Medicine

## 2016-07-18 MED FILL — VENTOLIN HFA 90 MCG INHALER: 108 (90 BAS | 25 days supply | Qty: 18 | Fill #2

## 2016-07-18 MED FILL — MONTELUKAST SOD 10 MG TAB: 10 | 90 days supply | Qty: 90 | Fill #0

## 2016-08-01 ENCOUNTER — Other Ambulatory Visit: Payer: Self-pay | Admitting: Nurse Practitioner

## 2016-08-01 MED ORDER — PREDNISONE 20 MG PO TABS: ORAL_TABLET | ORAL | 0 refills | Status: DC

## 2016-08-01 NOTE — Progress Notes (Signed)
Sent staff note about flare up of allergies and wheezing. Sending in prednisone taper as a precaution.

## 2016-08-15 ENCOUNTER — Telehealth: Payer: Self-pay | Admitting: Adult Health

## 2016-08-15 ENCOUNTER — Ambulatory Visit (HOSPITAL_COMMUNITY)
Admission: RE | Admit: 2016-08-15 | Discharge: 2016-08-15 | Disposition: A | Discharge status: Home / Self Care | Payer: 59 | Source: Ambulatory Visit | Attending: Adult Health | Admitting: Adult Health

## 2016-08-15 DIAGNOSIS — R52 Pain, unspecified: Secondary | ICD-10-CM

## 2016-08-15 DIAGNOSIS — R071 Chest pain on breathing: Secondary | ICD-10-CM | POA: Diagnosis not present

## 2016-08-15 DIAGNOSIS — R059 Cough, unspecified: Secondary | ICD-10-CM

## 2016-08-15 MED ORDER — CYCLOBENZAPRINE HCL 10 MG PO TABS: 10.0000 mg | ORAL_TABLET | Freq: Three times a day (TID) | ORAL | 1 refills | Status: DC | PRN

## 2016-08-15 NOTE — Telephone Encounter (Signed)
Pt Has had cough and treated with steroids and has pain left scapula area with inspiration.Has good ROM of left arm and lungs clear, but will get chest xray and try ice and flexeril, F/U by phone in Am

## 2016-08-22 MED FILL — HYDROCHLOROTHIAZIDE 12.5 MG: 12.5 | 30 days supply | Qty: 30 | Fill #1

## 2016-08-24 ENCOUNTER — Encounter: Payer: Self-pay | Admitting: Family Medicine

## 2016-08-24 ENCOUNTER — Ambulatory Visit (INDEPENDENT_AMBULATORY_CARE_PROVIDER_SITE_OTHER): Payer: 59 | Admitting: Family Medicine

## 2016-08-24 VITALS — BP 142/88 | Temp 99.0°F | Ht 64.0 in | Wt 184.0 lb

## 2016-08-24 DIAGNOSIS — J040 Acute laryngitis: Secondary | ICD-10-CM

## 2016-08-24 DIAGNOSIS — J329 Chronic sinusitis, unspecified: Secondary | ICD-10-CM

## 2016-08-24 DIAGNOSIS — J310 Chronic rhinitis: Secondary | ICD-10-CM

## 2016-08-24 MED ORDER — AZITHROMYCIN 250 MG PO TABS: ORAL_TABLET | ORAL | 0 refills | Status: DC

## 2016-08-24 NOTE — Progress Notes (Signed)
   Subjective:    Patient ID: Hailey Holden, female    DOB: [birth year], 31 y.o.   MRN: [id number]  Sinusitis  This is a new problem. Episode onset: 2 days. Associated symptoms include coughing and headaches. (Wheezing, chest tightness) Past treatments include acetaminophen (inhaler).   Cough and cong and dranage  Burning in the chest  Cough cong and wheezing  Not terrible cough but agravating   Low gr temp ,  Patient notes left-sided posterior back pain achy in nature sharp in nature. Originally worse with shoulder movement. Now just deep breath. Occurred a couple weeks ago. Negative chest x-ray slowly improving  Review of Systems  Respiratory: Positive for cough.   Neurological: Positive for headaches.       Objective:   Physical Exam  Alert, mild malaise. Hydration good Vitals stable. frontal/ maxillary tenderness evident positive nasal congestion. pharynx normal neck supple  lungs clear/no crackles or wheezes. heart regular in rhythm Slight parascapular tenderness to deep palpation shoulder good range of motion      Assessment & Plan:  #1 rhinosinusitis/laryngitis/posterior shoulder strain discussed week 2 tablets twice a day expect slow resolution antibiotics prescribed

## 2016-09-07 MED FILL — AMLODIPINE BESYLATE 10 MG T: 10 | 90 days supply | Qty: 90 | Fill #3

## 2016-09-27 ENCOUNTER — Other Ambulatory Visit: Payer: Self-pay | Admitting: Nurse Practitioner

## 2016-09-27 MED FILL — DULoxetine HCL 60 MG CPEP: 60 | 90 days supply | Qty: 90 | Fill #0

## 2016-09-29 ENCOUNTER — Other Ambulatory Visit: Payer: Self-pay | Admitting: Adult Health

## 2016-09-29 MED ORDER — HYDROCHLOROTHIAZIDE 12.5 MG PO CAPS: 12.5000 mg | ORAL_CAPSULE | Freq: Every day | ORAL | 3 refills | Status: DC

## 2016-09-29 NOTE — Progress Notes (Signed)
Refilled BP meds.

## 2016-10-27 ENCOUNTER — Encounter: Payer: Self-pay | Admitting: Adult Health

## 2016-10-27 ENCOUNTER — Other Ambulatory Visit (HOSPITAL_COMMUNITY)
Admission: RE | Admit: 2016-10-27 | Discharge: 2016-10-27 | Disposition: A | Discharge status: Home / Self Care | Payer: 59 | Source: Ambulatory Visit | Attending: Adult Health | Admitting: Adult Health

## 2016-10-27 ENCOUNTER — Ambulatory Visit (INDEPENDENT_AMBULATORY_CARE_PROVIDER_SITE_OTHER): Payer: 59 | Admitting: Adult Health

## 2016-10-27 VITALS — BP 140/92 | HR 132 | Ht 63.5 in | Wt 194.0 lb

## 2016-10-27 DIAGNOSIS — N888 Other specified noninflammatory disorders of cervix uteri: Secondary | ICD-10-CM | POA: Diagnosis not present

## 2016-10-27 DIAGNOSIS — I10 Essential (primary) hypertension: Secondary | ICD-10-CM

## 2016-10-27 DIAGNOSIS — R635 Abnormal weight gain: Secondary | ICD-10-CM | POA: Diagnosis not present

## 2016-10-27 DIAGNOSIS — Z01419 Encounter for gynecological examination (general) (routine) without abnormal findings: Secondary | ICD-10-CM | POA: Diagnosis not present

## 2016-10-27 DIAGNOSIS — L732 Hidradenitis suppurativa: Secondary | ICD-10-CM | POA: Diagnosis not present

## 2016-10-27 DIAGNOSIS — R5383 Other fatigue: Secondary | ICD-10-CM | POA: Insufficient documentation

## 2016-10-27 DIAGNOSIS — Z308 Encounter for other contraceptive management: Secondary | ICD-10-CM | POA: Diagnosis not present

## 2016-10-27 DIAGNOSIS — Z01411 Encounter for gynecological examination (general) (routine) with abnormal findings: Secondary | ICD-10-CM

## 2016-10-27 DIAGNOSIS — L659 Nonscarring hair loss, unspecified: Secondary | ICD-10-CM | POA: Diagnosis not present

## 2016-10-27 MED ORDER — SULFAMETHOXAZOLE-TRIMETHOPRIM 800-160 MG PO TABS: 1.0000 | ORAL_TABLET | Freq: Two times a day (BID) | ORAL | 1 refills | Status: DC

## 2016-10-27 NOTE — Progress Notes (Signed)
Patient ID: Hailey Holden, female   DOB: [birth year], 31 y.o.   MRN: [id number] History of Present Illness: Hailey Holden is a 31 year old white female, married, G2P2 in for a well woman gyn exam and pap.She is complaining of fatigue, weight gain, moody, hair loss and being tired all the time and decrease sex drive. 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  PCP is C.Hoskins NP.    Current Medications, Allergies, Past Medical History, Past Surgical History, Family History and Social History were reviewed in [facility].     Review of Systems: Patient denies any headaches, hearing loss, blurred vision, shortness of breath, chest pain, abdominal pain, problems with bowel movements(chronic constipation), urination, or intercourse. No joint pain. See HPI for positives.    Physical Exam:BP (!) 140/92 (BP Location: Left Arm, Patient Position: Sitting, Cuff Size: Normal)   Pulse (!) 132   Ht 5' 3.5" (1.613 m)   Wt 194 lb (88 kg)   BMI 33.83 kg/m  General:  Well developed, well nourished, no acute distress Skin:  Warm and dry Neck:  Midline trachea, normal thyroid, good ROM, no lymphadenopathy Lungs; Clear to auscultation bilaterally Breast:  No dominant palpable mass, retraction, or nipple discharge Cardiovascular: Regular rate and rhythm Abdomen:  Soft, non tender, no hepatosplenomegaly Pelvic:  External genitalia is normal in appearance, +hidradenitis and 2 skin tags.  The vagina is normal in appearance.nuva ring in. Urethra has no lesions or masses. The cervix is bulbous, everted at os and friable with EC brush, pap with HPV performed.  Uterus is felt to be normal size, shape, and contour.  No adnexal masses or tenderness noted.Bladder is non tender, no masses felt.She has not noticed any spotting after sex, if does will rx nuvessa x 1. Extremities/musculoskeletal:  No swelling or varicosities noted, no clubbing or cyanosis Psych: Alert and cooperative,seems happy,moody PHQ 2  score 0.  Impression: 1. Encounter for gynecological examination with Papanicolaou smear of cervix   2. Hidradenitis   3. Fatigue, unspecified type   4. Weight gain   5. Hair loss   6. Friable cervix   7. Encounter for other contraceptive management   8. Essential hypertension       Plan:  Rx septra ds 1 bid x 14 days #28 with 1 refill for flares Continue BP meds and nuva ring has refills  Check CBC,CMP,TSH, free T4 and thyroid antibodies  Physical in 1 year, pap in 3 if normal

## 2016-10-28 ENCOUNTER — Telehealth: Payer: Self-pay | Admitting: Adult Health

## 2016-10-28 DIAGNOSIS — E069 Thyroiditis, unspecified: Secondary | ICD-10-CM

## 2016-10-28 LAB — COMPREHENSIVE METABOLIC PANEL
ALBUMIN: 4.4 g/dL (ref 3.5–5.5)
ALT: 16 IU/L (ref 0–32)
AST: 16 IU/L (ref 0–40)
Albumin/Globulin Ratio: 1.6 (ref 1.2–2.2)
Alkaline Phosphatase: 117 IU/L (ref 39–117)
BUN / CREAT RATIO: 11 (ref 9–23)
BUN: 8 mg/dL (ref 6–20)
Bilirubin Total: 0.3 mg/dL (ref 0.0–1.2)
CALCIUM: 9.4 mg/dL (ref 8.7–10.2)
CO2: 20 mmol/L (ref 20–29)
CREATININE: 0.73 mg/dL (ref 0.57–1.00)
Chloride: 101 mmol/L (ref 96–106)
GFR, EST AFRICAN AMERICAN: 126 mL/min/{1.73_m2} (ref >59)
GFR, EST NON AFRICAN AMERICAN: 109 mL/min/{1.73_m2} (ref >59)
GLOBULIN, TOTAL: 2.8 g/dL (ref 1.5–4.5)
Glucose: 104 mg/dL — ABNORMAL HIGH (ref 65–99)
Potassium: 3.9 mmol/L (ref 3.5–5.2)
SODIUM: 138 mmol/L (ref 134–144)
TOTAL PROTEIN: 7.2 g/dL (ref 6.0–8.5)

## 2016-10-28 LAB — CBC
HEMATOCRIT: 40.5 % (ref 34.0–46.6)
HEMOGLOBIN: 13.3 g/dL (ref 11.1–15.9)
MCH: 28.4 pg (ref 26.6–33.0)
MCHC: 32.8 g/dL (ref 31.5–35.7)
MCV: 86 fL (ref 79–97)
Platelets: 434 10*3/uL — ABNORMAL HIGH (ref 150–379)
RBC: 4.69 x10E6/uL (ref 3.77–5.28)
RDW: 13.1 % (ref 12.3–15.4)
WBC: 12.3 10*3/uL — AB (ref 3.4–10.8)

## 2016-10-28 LAB — TSH: TSH: 3.09 u[IU]/mL (ref 0.450–4.500)

## 2016-10-28 LAB — T4, FREE: FREE T4: 1.36 ng/dL (ref 0.82–1.77)

## 2016-10-28 LAB — THYROID PEROXIDASE ANTIBODY: Thyroperoxidase Ab SerPl-aCnc: 102 IU/mL — ABNORMAL HIGH (ref 0–34)

## 2016-10-28 NOTE — Telephone Encounter (Signed)
Voicemail not set up.

## 2016-10-28 NOTE — Telephone Encounter (Signed)
Pt aware  Of labs and elevated thyroid antibody, will refer to Dr Fransico HimNIda

## 2016-11-01 LAB — CYTOLOGY - PAP
DIAGNOSIS: NEGATIVE
HPV: NOT DETECTED

## 2016-11-03 ENCOUNTER — Ambulatory Visit: Payer: 59 | Admitting: "Endocrinology

## 2016-11-07 ENCOUNTER — Ambulatory Visit (INDEPENDENT_AMBULATORY_CARE_PROVIDER_SITE_OTHER): Payer: 59 | Admitting: "Endocrinology

## 2016-11-07 ENCOUNTER — Encounter: Payer: Self-pay | Admitting: "Endocrinology

## 2016-11-07 VITALS — BP 138/93 | HR 108 | Wt 195.0 lb

## 2016-11-07 DIAGNOSIS — E063 Autoimmune thyroiditis: Secondary | ICD-10-CM | POA: Diagnosis not present

## 2016-11-07 NOTE — Progress Notes (Signed)
Subjective:    Patient ID: Hailey Holden, female    DOB: 1985-04-13, PCP Merlyn AlbertLuking, William S, MD   Past Medical History:  Diagnosis Date  . Asthma   . Breast nodule 02/04/2014  . Constipation   . Encounter for other contraceptive management 10/21/2014  . Enlarged thyroid 09/27/2012  . Hidradenitis   . High cholesterol   . Hypertension   . Migraine headache   . PMS (premenstrual syndrome) 09/27/2012  . Tachycardia    patient states she has long history of resting tachycardia  . Uterine prolapse 10/21/2014  . Vaginal irritation 10/08/2015  . Yeast infection 10/08/2015   No past surgical history on file. Social History   Social History  . Marital status: Married    Spouse name: N/A  . Number of children: N/A  . Years of education: N/A   Social History Main Topics  . Smoking status: Never Smoker  . Smokeless tobacco: Never Used  . Alcohol use No  . Drug use: No  . Sexual activity: Yes    Birth control/ protection: Inserts   Other Topics Concern  . None   Social History Narrative  . None   Outpatient Encounter Prescriptions as of 11/07/2016  Medication Sig  . albuterol (VENTOLIN HFA) 108 (90 Base) MCG/ACT inhaler INHALE 2 PUFFS BY MOUTH EVERY 4 HOURS AS NEEDED FOR WHEEZING  . amLODipine (NORVASC) 10 MG tablet Take 1 tablet (10 mg total) by mouth daily.  . cetirizine (ZYRTEC) 10 MG tablet Take 10 mg by mouth daily.  . DULoxetine (CYMBALTA) 60 MG capsule TAKE 1 CAPSULE BY MOUTH ONCE DAILY  . etonogestrel-ethinyl estradiol (NUVARING) 0.12-0.015 MG/24HR vaginal ring Insert vaginally and leave in place for 3 consecutive weeks, then remove for 1 week.  . hydrochlorothiazide (MICROZIDE) 12.5 MG capsule Take 1 capsule (12.5 mg total) by mouth daily.  . Naproxen Sodium (ALEVE PO) Take by mouth as needed.  . sulfamethoxazole-trimethoprim (BACTRIM DS,SEPTRA DS) 800-160 MG tablet Take 1 tablet by mouth 2 (two) times daily. (Patient taking differently: Take 1 tablet by mouth as needed. )   . [DISCONTINUED] montelukast (SINGULAIR) 10 MG tablet TAKE 1 TABLET BY MOUTH ONCE DAILY AT BEDTIME   No facility-administered encounter medications on file as of 11/07/2016.    ALLERGIES: Allergies  Allergen Reactions  . Clindamycin/Lincomycin Shortness Of Breath and Rash  . Tdap [Diphth-Acell Pertussis-Tetanus] Itching    Itching, swelling in the arm, chest tightness, wheezing, headache, SOB  . Latex   . Penicillins     VACCINATION STATUS:  There is no immunization history on file for this patient.  HPI  31 year old female patient with medical history as above. She is being seen in consultation for abnormal thyroid function tests and history of nodular goiter. This consult is requested by Dr.  Ardyth GalLuking, William. -She does have family history of thyroid dysfunction of various types but no history of thyroid cancer. She was found to have elevated TPO antibodies of 102 (normal less than 34) associated with normal thyroid hormone free T4 1.36, TSH 3.09. -In June 2013 she underwent thyroid ultrasound which showed 3 mm small nodule on the right lobe of the thyroid. She denies having  surveillance thyroid sonograms since then. - She has observed that she is progressively gaining weight, gaining 15 pounds over the last 2 years. She denies history of diabetes, recent A1c was 5.2%. -She denies cold intolerance, heat intolerance, palpitations, tremors. - She admits to dietary indiscretion, and admits to consuming large quantities of processed  carbohydrates including soda.  Review of Systems  Constitutional: +weight gain,  no fatigue, no subjective hyperthermia, no subjective hypothermia Eyes: no blurry vision, no xerophthalmia ENT: no sore throat, no nodules palpated in throat, no dysphagia/odynophagia, no hoarseness Cardiovascular: no Chest Pain, no Shortness of Breath, no palpitations, no leg swelling Respiratory: no cough, no SOB Gastrointestinal: no  Nausea/Vomiting/Diarhhea Musculoskeletal: no muscle/joint aches Skin: no rashes Neurological: no tremors, no numbness, no tingling, no dizziness Psychiatric: no depression, no anxiety  Objective:    BP (!) 138/93   Pulse (!) 136   Wt 195 lb (88.5 kg)   SpO2 96%   BMI 34.00 kg/m   Wt Readings from Last 3 Encounters:  11/07/16 195 lb (88.5 kg)  10/27/16 194 lb (88 kg)  08/24/16 184 lb (83.5 kg)    Physical Exam  Constitutional: obese, not in acute distress, normal state of mind Eyes: PERRLA, EOMI, no exophthalmos ENT: moist mucous membranes, + palpable thyromegaly, no cervical lymphadenopathy Cardiovascular: normal precordial activity, mildly tachycardic at 108, , no Murmur/Rubs/Gallops Respiratory:  adequate breathing efforts, no gross chest deformity, Clear to auscultation bilaterally Gastrointestinal: abdomen soft, Non -tender, No distension, Bowel Sounds present Musculoskeletal: no gross deformities, strength intact in all four extremities Skin: moist, warm, no rashes Neurological: no tremor with outstretched hands, Deep tendon reflexes normal in all four extremities.  CMP ( most recent) CMP     Component Value Date/Time   NA 138 10/27/2016 1653   K 3.9 10/27/2016 1653   CL 101 10/27/2016 1653   CO2 20 10/27/2016 1653   GLUCOSE 104 (H) 10/27/2016 1653   GLUCOSE 81 10/17/2013 0901   BUN 8 10/27/2016 1653   CREATININE 0.73 10/27/2016 1653   CREATININE 0.55 10/17/2013 0901   CALCIUM 9.4 10/27/2016 1653   PROT 7.2 10/27/2016 1653   ALBUMIN 4.4 10/27/2016 1653   AST 16 10/27/2016 1653   ALT 16 10/27/2016 1653   ALKPHOS 117 10/27/2016 1653   BILITOT 0.3 10/27/2016 1653   GFRNONAA 109 10/27/2016 1653   GFRAA 126 10/27/2016 1653    Diabetic Labs (most recent): Lab Results  Component Value Date   HGBA1C 5.2 04/24/2015   HGBA1C 5.4 10/02/2012     Lipid Panel ( most recent) Lipid Panel     Component Value Date/Time   CHOL 243 (H) 04/24/2015 0859   TRIG 105  04/24/2015 0859   HDL 47 04/24/2015 0859   CHOLHDL 5.2 (H) 04/24/2015 0859   CHOLHDL 4.4 10/17/2013 0901   VLDL 16 10/17/2013 0901   LDLCALC 175 (H) 04/24/2015 0859      Assessment & Plan:   1. Hashimoto's thyroiditis - This patient is being seen at the kind request of her PCP Luking, Vilinda Blanks, MD. - I have reviewed her available records on her thyroid as well as clinically evaluated this patient. - Her labs are indicative of antithyroid  Autoimmunity with high titers of TPO antibodies. Based on the fact that her thyroid hormones are within normal range, she would not require intervention with thyroid hormone for now. - I'll proceed to request repeat thyroid/neck ultrasound to monitor size of thyroid in nodule. - If her previously noted nodule is growing to above 1 cm in size, she will be approached for fine needle aspiration.  - She was noted to have tachycardia and review of her medical records is also significant for chronic tachycardia, may benefit from baseline EKG.  -  Regarding her recent weight gain,  - I have counseled her  on diet management  by adopting a carbohydrate restricted/protein rich diet.  - Suggestion is made for her to avoid simple carbohydrates  from her diet including Cakes , Desserts, Ice Cream,  Soda (  diet and regular) , Sweet Tea , Candies,  Chips, Cookies, Artificial Sweeteners, alcohol/beer,   and "Sugar-free" Products .  -She is advised to stick to a routine mealtimes to eat 3 balanced meals  a day and avoid unnecessary snacks.  - I advised patient to maintain close follow up with Merlyn Albert, MD for primary care needs. Follow up plan: Return in about 4 months (around 03/09/2017) for follow up with pre-visit labs, Thyroid / Neck Ultrasound.  Marquis Lunch, MD Phone: [phone]  Fax: [phone]   11/07/2016, 1:14 PM

## 2016-11-08 ENCOUNTER — Ambulatory Visit: Payer: 59 | Admitting: "Endocrinology

## 2016-11-09 ENCOUNTER — Ambulatory Visit (HOSPITAL_COMMUNITY)
Admission: RE | Admit: 2016-11-09 | Discharge: 2016-11-09 | Disposition: A | Discharge status: Home / Self Care | Payer: 59 | Source: Ambulatory Visit | Attending: "Endocrinology | Admitting: "Endocrinology

## 2016-11-09 DIAGNOSIS — E063 Autoimmune thyroiditis: Secondary | ICD-10-CM | POA: Insufficient documentation

## 2016-12-09 ENCOUNTER — Other Ambulatory Visit: Payer: Self-pay | Admitting: Adult Health

## 2016-12-09 MED ORDER — AMLODIPINE BESYLATE 10 MG PO TABS: 10.0000 mg | ORAL_TABLET | Freq: Every day | ORAL | 3 refills | Status: DC

## 2016-12-09 MED ORDER — ALBUTEROL SULFATE HFA 108 (90 BASE) MCG/ACT IN AERS: INHALATION_SPRAY | RESPIRATORY_TRACT | 3 refills | Status: DC

## 2016-12-09 MED FILL — AMLODIPINE BESYLATE 10 MG T: 10 | 90 days supply | Qty: 90 | Fill #0

## 2016-12-09 NOTE — Progress Notes (Signed)
[facility] sent text, needs refills on norvasc and on inhaler, will refill.

## 2016-12-13 ENCOUNTER — Encounter: Payer: Self-pay | Admitting: *Deleted

## 2016-12-22 ENCOUNTER — Other Ambulatory Visit: Payer: Self-pay | Admitting: *Deleted

## 2016-12-22 MED ORDER — DULOXETINE HCL 60 MG PO CPEP: 60.0000 mg | ORAL_CAPSULE | Freq: Every day | ORAL | 0 refills | Status: DC

## 2016-12-22 MED FILL — DULoxetine HCL 60 MG CPEP: 60 | 90 days supply | Qty: 90 | Fill #0

## 2016-12-30 ENCOUNTER — Other Ambulatory Visit: Payer: Self-pay | Admitting: Adult Health

## 2017-02-06 ENCOUNTER — Other Ambulatory Visit: Payer: Self-pay | Admitting: Nurse Practitioner

## 2017-02-06 ENCOUNTER — Other Ambulatory Visit: Payer: Self-pay | Admitting: Adult Health

## 2017-02-06 MED ORDER — ONDANSETRON 8 MG PO TBDP: 8.0000 mg | ORAL_TABLET | Freq: Three times a day (TID) | ORAL | 0 refills | Status: DC | PRN

## 2017-02-06 MED ORDER — BUPROPION HCL ER (XL) 150 MG PO TB24: 150.0000 mg | ORAL_TABLET | Freq: Every day | ORAL | 0 refills | Status: DC

## 2017-02-06 MED FILL — BUPROPION HCL XL 150 MG TAB: 150 | 90 days supply | Qty: 90 | Fill #0

## 2017-02-06 MED FILL — ONDANSETRON ODT 8 MG TABLET: 8 | 7 days supply | Qty: 20 | Fill #0

## 2017-02-06 NOTE — Progress Notes (Signed)
[facility] sent text requesting zofran for up coming bus trip. Will rx zofran

## 2017-02-13 ENCOUNTER — Encounter: Payer: Self-pay | Admitting: "Endocrinology

## 2017-02-17 ENCOUNTER — Other Ambulatory Visit: Payer: Self-pay | Admitting: "Endocrinology

## 2017-02-17 DIAGNOSIS — E063 Autoimmune thyroiditis: Secondary | ICD-10-CM

## 2017-03-01 ENCOUNTER — Other Ambulatory Visit: Payer: Self-pay | Admitting: "Endocrinology

## 2017-03-01 DIAGNOSIS — E063 Autoimmune thyroiditis: Secondary | ICD-10-CM | POA: Diagnosis not present

## 2017-03-02 DIAGNOSIS — H5213 Myopia, bilateral: Secondary | ICD-10-CM | POA: Diagnosis not present

## 2017-03-02 DIAGNOSIS — H52223 Regular astigmatism, bilateral: Secondary | ICD-10-CM | POA: Diagnosis not present

## 2017-03-02 LAB — TSH: TSH: 2.49 u[IU]/mL (ref 0.450–4.500)

## 2017-03-02 LAB — T4, FREE: Free T4: 1.37 ng/dL (ref 0.82–1.77)

## 2017-03-02 LAB — T3, FREE: T3 FREE: 3.4 pg/mL (ref 2.0–4.4)

## 2017-03-06 ENCOUNTER — Other Ambulatory Visit: Payer: Self-pay | Admitting: Adult Health

## 2017-03-06 MED FILL — VENTOLIN HFA 90 MCG INHALER: 108 (90 BAS | 30 days supply | Qty: 18 | Fill #0

## 2017-03-09 ENCOUNTER — Ambulatory Visit (INDEPENDENT_AMBULATORY_CARE_PROVIDER_SITE_OTHER): Payer: 59 | Admitting: "Endocrinology

## 2017-03-09 ENCOUNTER — Encounter: Payer: Self-pay | Admitting: "Endocrinology

## 2017-03-09 VITALS — BP 138/88 | HR 121 | Ht 63.5 in | Wt 200.0 lb

## 2017-03-09 DIAGNOSIS — E063 Autoimmune thyroiditis: Secondary | ICD-10-CM | POA: Diagnosis not present

## 2017-03-09 NOTE — Patient Instructions (Signed)

## 2017-03-09 NOTE — Progress Notes (Signed)
Subjective:    Patient ID: Hailey Holden, female    DOB: 1985-12-13, PCP Merlyn Albert, MD   Past Medical History:  Diagnosis Date  . Asthma   . Breast nodule 02/04/2014  . Constipation   . Encounter for other contraceptive management 10/21/2014  . Enlarged thyroid 09/27/2012  . Hidradenitis   . High cholesterol   . Hypertension   . Migraine headache   . PMS (premenstrual syndrome) 09/27/2012  . Tachycardia    patient states she has long history of resting tachycardia  . Uterine prolapse 10/21/2014  . Vaginal irritation 10/08/2015  . Yeast infection 10/08/2015   No past surgical history on file. Social History   Socioeconomic History  . Marital status: Married    Spouse name: None  . Number of children: None  . Years of education: None  . Highest education level: None  Social Needs  . Financial resource strain: None  . Food insecurity - worry: None  . Food insecurity - inability: None  . Transportation needs - medical: None  . Transportation needs - non-medical: None  Occupational History  . None  Tobacco Use  . Smoking status: Never Smoker  . Smokeless tobacco: Never Used  Substance and Sexual Activity  . Alcohol use: No  . Drug use: No  . Sexual activity: Yes    Birth control/protection: Inserts  Other Topics Concern  . None  Social History Narrative  . None   Outpatient Encounter Medications as of 03/09/2017  Medication Sig  . albuterol (VENTOLIN HFA) 108 (90 Base) MCG/ACT inhaler INHALE 2 PUFFS BY MOUTH EVERY 4 HOURS AS NEEDED FOR WHEEZING  . amLODipine (NORVASC) 10 MG tablet Take 1 tablet (10 mg total) by mouth daily.  Marland Kitchen buPROPion (WELLBUTRIN XL) 150 MG 24 hr tablet Take 1 tablet (150 mg total) daily by mouth.  . cetirizine (ZYRTEC) 10 MG tablet Take 10 mg by mouth daily.  . DULoxetine (CYMBALTA) 60 MG capsule Take 1 capsule (60 mg total) by mouth daily.  . hydrochlorothiazide (MICROZIDE) 12.5 MG capsule TAKE ONE (1) CAPSULE EACH DAY  . Naproxen Sodium  (ALEVE PO) Take by mouth as needed.  . ondansetron (ZOFRAN-ODT) 8 MG disintegrating tablet Take 1 tablet (8 mg total) every 8 (eight) hours as needed by mouth for nausea or vomiting.  . sulfamethoxazole-trimethoprim (BACTRIM DS,SEPTRA DS) 800-160 MG tablet Take 1 tablet by mouth 2 (two) times daily. (Patient not taking: Reported on 03/09/2017)  . [DISCONTINUED] etonogestrel-ethinyl estradiol (NUVARING) 0.12-0.015 MG/24HR vaginal ring Insert vaginally and leave in place for 3 consecutive weeks, then remove for 1 week.   No facility-administered encounter medications on file as of 03/09/2017.    ALLERGIES: Allergies  Allergen Reactions  . Clindamycin/Lincomycin Shortness Of Breath and Rash  . Tdap [Diphth-Acell Pertussis-Tetanus] Itching    Itching, swelling in the arm, chest tightness, wheezing, headache, SOB  . Latex   . Penicillins     VACCINATION STATUS:  There is no immunization history on file for this patient.  HPI  31 year old female patient with medical history as above. She is being seen in f/u for abnormal thyroid function tests and history of nodular goiter. This consult is requested by Dr.  Ardyth Gal. -She does have family history of thyroid dysfunction of various types but no history of thyroid cancer. She was found to have elevated TPO antibodies of 102 (normal less than 34) associated with normal thyroid hormone free T4 1.36, TSH 3.09 prior to her last visit in July  2018. - Her repeat labs from November 2018 showed essentially normal thyroid function test. -In June 2013 she underwent thyroid ultrasound which showed 3 mm small nodule on the right lobe of the thyroid.  - Her thyroid ultrasound from August 2018 was unremarkable, and nodule on the right lobe was still small at 5 mm. - She has observed that she is progressively gaining weight, gaining 20 pounds over the last 2 years. - She admits to dietary indiscretion, consumes heavy processed carbs including soda.. - She  denies history of diabetes, recent A1c was 5.2%. -She denies cold intolerance, heat intolerance, +palpitations ( reports to have palpitation " forever", was supposed to have EKG at [facility] but did not make any appointments for that). She denies tremors.   Review of Systems  Constitutional: +weight gain,  no fatigue, no subjective hyperthermia, no subjective hypothermia Eyes: no blurry vision, no xerophthalmia ENT: no sore throat, no nodules palpated in throat, no dysphagia/odynophagia, no hoarseness Cardiovascular: no Chest Pain, no Shortness of Breath, no palpitations, no leg swelling Respiratory: no cough, no SOB Gastrointestinal: no Nausea/Vomiting/Diarhhea Musculoskeletal: no muscle/joint aches Skin: no rashes Neurological: no tremors, no numbness, no tingling, no dizziness Psychiatric: no depression, no anxiety  Objective:    BP 138/88   Pulse (!) 121   Ht 5' 3.5" (1.613 m)   Wt 200 lb (90.7 kg)   BMI 34.87 kg/m   Wt Readings from Last 3 Encounters:  03/09/17 200 lb (90.7 kg)  11/07/16 195 lb (88.5 kg)  10/27/16 194 lb (88 kg)    Physical Exam  Constitutional: obese, not in acute distress, normal state of mind Eyes: PERRLA, EOMI, no exophthalmos ENT: moist mucous membranes, + palpable thyromegaly, no cervical lymphadenopathy Cardiovascular: normal precordial activity,  tachycardic at  121 increasing from 108 during last visit , no Murmur/Rubs/Gallops Respiratory:  adequate breathing efforts, no gross chest deformity, Clear to auscultation bilaterally Gastrointestinal: abdomen soft, Non -tender, No distension, Bowel Sounds present Musculoskeletal: no gross deformities, strength intact in all four extremities Skin: moist, warm, no rashes Neurological: no tremor with outstretched hands, Deep tendon reflexes normal in all four extremities.  CMP ( most recent) CMP     Component Value Date/Time   NA 138 10/27/2016 1653   K 3.9 10/27/2016 1653   CL 101 10/27/2016 1653   CO2  20 10/27/2016 1653   GLUCOSE 104 (H) 10/27/2016 1653   GLUCOSE 81 10/17/2013 0901   BUN 8 10/27/2016 1653   CREATININE 0.73 10/27/2016 1653   CREATININE 0.55 10/17/2013 0901   CALCIUM 9.4 10/27/2016 1653   PROT 7.2 10/27/2016 1653   ALBUMIN 4.4 10/27/2016 1653   AST 16 10/27/2016 1653   ALT 16 10/27/2016 1653   ALKPHOS 117 10/27/2016 1653   BILITOT 0.3 10/27/2016 1653   GFRNONAA 109 10/27/2016 1653   GFRAA 126 10/27/2016 1653    Diabetic Labs (most recent): Lab Results  Component Value Date   HGBA1C 5.2 04/24/2015   HGBA1C 5.4 10/02/2012     Lipid Panel ( most recent) Lipid Panel     Component Value Date/Time   CHOL 243 (H) 04/24/2015 0859   TRIG 105 04/24/2015 0859   HDL 47 04/24/2015 0859   CHOLHDL 5.2 (H) 04/24/2015 0859   CHOLHDL 4.4 10/17/2013 0901   VLDL 16 10/17/2013 0901   LDLCALC 175 (H) 04/24/2015 0859      Assessment & Plan:   1. Hashimoto's thyroiditis  - I have reviewed her available records on her thyroid as  well as clinically evaluated this patient. - Her labs are indicative of antithyroid  Autoimmunity with high titers of TPO antibodies. Based on the fact that her thyroid hormones are within normal range, she will not require intervention with thyroid hormone for now. - She has unexplained tachycardia, advised her to seek EKG and cardiac evaluation. - She will not require any thyroid hormone or intervention for 5 mm nodule in the right lobe of her thyroid. .  -  Regarding her recent weight gain,  - I have counseled her on diet management  by adopting a carbohydrate restricted/protein rich diet, and to introduce exercise routine. -She is advised to stick to a routine mealtimes to eat 3 balanced meals  a day and avoid unnecessary snacks.  -  Suggestion is made for her to avoid simple carbohydrates  from her diet including Cakes, Sweet Desserts / Pastries, Ice Cream, Soda (diet and regular), Sweet Tea, Candies, Chips, Cookies, Store Bought Juices,  Alcohol in Excess of  1-2 drinks a day, Artificial Sweeteners, and "Sugar-free" Products. This will help patient to have stable blood glucose profile and potentially avoid unintended weight gain.   - I advised patient to maintain close follow up with Merlyn AlbertLuking, William S, MD for primary care needs. Follow up plan: Return in about 6 months (around 09/07/2017) for follow up with pre-visit labs.  Marquis LunchGebre Nida, MD Phone: [phone]  Fax: [phone]   03/09/2017, 4:24 PM

## 2017-03-22 ENCOUNTER — Other Ambulatory Visit: Payer: Self-pay | Admitting: Nurse Practitioner

## 2017-03-22 MED FILL — AMLODIPINE BESYLATE 10 MG T: 10 | 90 days supply | Qty: 90 | Fill #1

## 2017-03-22 MED FILL — DULoxetine HCL 60 MG CPEP: 60 | 90 days supply | Qty: 90 | Fill #0

## 2017-05-04 ENCOUNTER — Encounter: Payer: Self-pay | Admitting: Nurse Practitioner

## 2017-05-04 ENCOUNTER — Ambulatory Visit (INDEPENDENT_AMBULATORY_CARE_PROVIDER_SITE_OTHER): Payer: No Typology Code available for payment source | Admitting: Nurse Practitioner

## 2017-05-04 VITALS — BP 146/82 | Ht 63.5 in | Wt 203.6 lb

## 2017-05-04 DIAGNOSIS — I10 Essential (primary) hypertension: Secondary | ICD-10-CM

## 2017-05-04 DIAGNOSIS — R5383 Other fatigue: Secondary | ICD-10-CM | POA: Diagnosis not present

## 2017-05-04 NOTE — Patient Instructions (Addendum)
Saxenda Contrave B vitamin complex

## 2017-05-07 ENCOUNTER — Encounter: Payer: Self-pay | Admitting: Nurse Practitioner

## 2017-05-07 NOTE — Progress Notes (Signed)
Subjective:  Presents for routine follow up. BP outside of the office has run well; last 128/76. Works as a Engineer, civil (consulting)nurse in the hospital. No CP/ischemic type pain or SOB. No difficulty speaking or swallowing. No numbness or weakness of the face, arms or legs. Doing well overall on wellbutrin and cymbalta. Would like to look at medications for weight loss. At end of visit, mentions that she has had some mild intermittent tingling in the fingers.   Objective:   BP (!) 146/82   Ht 5' 3.5" (1.613 m)   Wt 203 lb 9.6 oz (92.4 kg)   BMI 35.50 kg/m  NAD. Alert, oriented. Lungs clear. Heart RRR. Carotids no bruits or thrills. BP on recheck right arm sitting 138/88. LE: no edema. Cheerful, calm affect.   Assessment:   Problem List Items Addressed This Visit      Cardiovascular and Mediastinum   Essential hypertension - Primary     Other   Fatigue   Morbid obesity (HCC)       Plan:    Continue current medications as directed. Continue to monitor BP outside of office and notify us if above 140/90. Discussed medications to help weight loss. Patient will research and look into cost and get back with us.  Return in about 6 months (around 11/01/2017) for recheck.

## 2017-05-18 ENCOUNTER — Other Ambulatory Visit: Payer: Self-pay | Admitting: Nurse Practitioner

## 2017-05-18 MED ORDER — NALTREXONE-BUPROPION HCL ER 8-90 MG PO TB12: ORAL_TABLET | ORAL | 0 refills | Status: DC

## 2017-05-18 MED FILL — VENTOLIN HFA 90 MCG INHALER: 108 (90 BAS | 30 days supply | Qty: 18 | Fill #1

## 2017-06-16 ENCOUNTER — Encounter: Payer: Self-pay | Admitting: Nurse Practitioner

## 2017-06-29 ENCOUNTER — Other Ambulatory Visit: Payer: Self-pay | Admitting: Nurse Practitioner

## 2017-06-29 MED FILL — DULoxetine HCL 60 MG CPEP: 60 | 90 days supply | Qty: 90 | Fill #0

## 2017-06-29 MED FILL — AMLODIPINE BESYLATE 10 MG T: 10 | 90 days supply | Qty: 90 | Fill #2

## 2017-08-16 ENCOUNTER — Encounter: Payer: Self-pay | Admitting: *Deleted

## 2017-08-21 ENCOUNTER — Other Ambulatory Visit: Payer: Self-pay | Admitting: Family Medicine

## 2017-08-21 MED FILL — VENTOLIN HFA 90 MCG INHALER: 108 (90 BAS | 30 days supply | Qty: 18 | Fill #2

## 2017-09-07 ENCOUNTER — Ambulatory Visit: Payer: 59 | Admitting: "Endocrinology

## 2017-09-19 ENCOUNTER — Telehealth: Payer: Self-pay | Admitting: Nurse Practitioner

## 2017-09-19 ENCOUNTER — Other Ambulatory Visit: Payer: Self-pay | Admitting: Nurse Practitioner

## 2017-09-19 DIAGNOSIS — Z79899 Other long term (current) drug therapy: Secondary | ICD-10-CM

## 2017-09-19 DIAGNOSIS — E049 Nontoxic goiter, unspecified: Secondary | ICD-10-CM

## 2017-09-19 DIAGNOSIS — Z1321 Encounter for screening for nutritional disorder: Secondary | ICD-10-CM

## 2017-09-19 DIAGNOSIS — R5383 Other fatigue: Secondary | ICD-10-CM

## 2017-09-19 DIAGNOSIS — I10 Essential (primary) hypertension: Secondary | ICD-10-CM

## 2017-09-19 NOTE — Telephone Encounter (Signed)
Please put in orders for labs: met 7, lipid, liver, TSH and vitamin D. I will notify patient. Thanks.

## 2017-09-19 NOTE — Telephone Encounter (Signed)
Noted  

## 2017-09-19 NOTE — Telephone Encounter (Signed)
Labs placed in Epic 

## 2017-09-28 LAB — BASIC METABOLIC PANEL
BUN/Creatinine Ratio: 11 (ref 9–23)
BUN: 7 mg/dL (ref 6–20)
CHLORIDE: 103 mmol/L (ref 96–106)
CO2: 25 mmol/L (ref 20–29)
Calcium: 9.3 mg/dL (ref 8.7–10.2)
Creatinine, Ser: 0.66 mg/dL (ref 0.57–1.00)
GFR calc Af Amer: 134 mL/min/{1.73_m2} (ref >59)
GFR, EST NON AFRICAN AMERICAN: 116 mL/min/{1.73_m2} (ref >59)
Glucose: 92 mg/dL (ref 65–99)
POTASSIUM: 4.1 mmol/L (ref 3.5–5.2)
SODIUM: 141 mmol/L (ref 134–144)

## 2017-09-28 LAB — LIPID PANEL
CHOL/HDL RATIO: 6 ratio — AB (ref 0.0–4.4)
Cholesterol, Total: 260 mg/dL — ABNORMAL HIGH (ref 100–199)
HDL: 43 mg/dL (ref >39)
LDL CALC: 192 mg/dL — AB (ref 0–99)
Triglycerides: 125 mg/dL (ref 0–149)
VLDL CHOLESTEROL CAL: 25 mg/dL (ref 5–40)

## 2017-09-28 LAB — HEPATIC FUNCTION PANEL
ALT: 56 IU/L — AB (ref 0–32)
AST: 33 IU/L (ref 0–40)
Albumin: 4.1 g/dL (ref 3.5–5.5)
Alkaline Phosphatase: 137 IU/L — ABNORMAL HIGH (ref 39–117)
Bilirubin Total: 0.7 mg/dL (ref 0.0–1.2)
Bilirubin, Direct: 0.16 mg/dL (ref 0.00–0.40)
TOTAL PROTEIN: 6.8 g/dL (ref 6.0–8.5)

## 2017-09-28 LAB — VITAMIN D 25 HYDROXY (VIT D DEFICIENCY, FRACTURES): VIT D 25 HYDROXY: 50.6 ng/mL (ref 30.0–100.0)

## 2017-09-28 LAB — TSH: TSH: 1.78 u[IU]/mL (ref 0.450–4.500)

## 2017-09-29 ENCOUNTER — Ambulatory Visit (INDEPENDENT_AMBULATORY_CARE_PROVIDER_SITE_OTHER): Payer: No Typology Code available for payment source | Admitting: Nurse Practitioner

## 2017-09-29 ENCOUNTER — Encounter: Payer: Self-pay | Admitting: Nurse Practitioner

## 2017-09-29 VITALS — BP 148/84 | Ht 63.5 in | Wt 204.8 lb

## 2017-09-29 DIAGNOSIS — I10 Essential (primary) hypertension: Secondary | ICD-10-CM

## 2017-09-29 DIAGNOSIS — E063 Autoimmune thyroiditis: Secondary | ICD-10-CM | POA: Diagnosis not present

## 2017-09-29 DIAGNOSIS — Z79899 Other long term (current) drug therapy: Secondary | ICD-10-CM | POA: Diagnosis not present

## 2017-09-29 DIAGNOSIS — E785 Hyperlipidemia, unspecified: Secondary | ICD-10-CM | POA: Diagnosis not present

## 2017-09-29 NOTE — Patient Instructions (Addendum)
I went on the Qsymia site and they do not have a discount card program anymore, only a pharmacy home delivery program called [email or web address]. It is $98 +$14 for standard shipping. With Goodrx it is still $198 @ [facility].  Recheck labs in 3 months.

## 2017-09-30 ENCOUNTER — Encounter: Payer: Self-pay | Admitting: Nurse Practitioner

## 2017-10-02 ENCOUNTER — Encounter: Payer: Self-pay | Admitting: Nurse Practitioner

## 2017-10-02 NOTE — Progress Notes (Signed)
Subjective: Presents for recheck on her hypertension.  Adherent to medication regimen.  States her BP is slightly elevated today, just left work and had to rush to get to her appointment.  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 Denies CP/ischemic type pain or SOB. No visual changes. No difficulty speaking or swallowing. No numbness or weakness of the face, arms or legs.  No edema.  Has been active.  Trying to watch her diet.  Continues to struggle to lose weight.  Could not get Contrave approved through her insurance.  Would also like a refill on her Singulair which works well for her asthma and allergies.  No difficulty swallowing.   Objective:   BP (!) 148/84   Ht 5' 3.5" (1.613 m)   Wt 204 lb 12.8 oz (92.9 kg)   BMI 35.71 kg/m  NAD.  Alert, oriented.  Lungs clear.  Heart regular rate and rhythm.  Thyroid nontender to palpation, no mass or goiter noted.  Lower extremities no edema. Results for orders placed or performed in visit on 09/19/17  Lipid panel  Result Value Ref Range   Cholesterol, Total 260 (H) 100 - 199 mg/dL   Triglycerides 161 0 - 149 mg/dL   HDL 43 >09 mg/dL   VLDL Cholesterol Cal 25 5 - 40 mg/dL   LDL Calculated 604 (H) 0 - 99 mg/dL   Comment: Comment    Chol/HDL Ratio 6.0 (H) 0.0 - 4.4 ratio  Hepatic function panel  Result Value Ref Range   Total Protein 6.8 6.0 - 8.5 g/dL   Albumin 4.1 3.5 - 5.5 g/dL   Bilirubin Total 0.7 0.0 - 1.2 mg/dL   Bilirubin, Direct 5.40 0.00 - 0.40 mg/dL   Alkaline Phosphatase 137 (H) 39 - 117 IU/L   AST 33 0 - 40 IU/L   ALT 56 (H) 0 - 32 IU/L  TSH  Result Value Ref Range   TSH 1.780 0.450 - 4.500 uIU/mL  VITAMIN D 25 Hydroxy (Vit-D Deficiency, Fractures)  Result Value Ref Range   Vit D, 25-Hydroxy 50.6 30.0 - 100.0 ng/mL  Basic metabolic panel  Result Value Ref Range   Glucose 92 65 - 99 mg/dL   BUN 7 6 - 20 mg/dL   Creatinine, Ser 9.81 0.57 - 1.00 mg/dL   GFR calc non Af Amer 116  >59 mL/min/1.73   GFR calc Af Amer 134 >59 mL/min/1.73   BUN/Creatinine Ratio 11 9 - 23   Sodium 141 134 - 144 mmol/L   Potassium 4.1 3.5 - 5.2 mmol/L   Chloride 103 96 - 106 mmol/L   CO2 25 20 - 29 mmol/L   Calcium 9.3 8.7 - 10.2 mg/dL     Assessment:   Problem List Items Addressed This Visit      Cardiovascular and Mediastinum   Essential hypertension - Primary   Relevant Orders   Lipid Profile   Hepatic function panel     Endocrine   Hashimoto's thyroiditis     Other   Hyperlipidemia   Relevant Orders   Lipid Profile   Morbid obesity (HCC)    Other Visit Diagnoses    High risk medication use       Relevant Orders   Hepatic function panel         Plan: Recent labs reviewed with patient.  Main concern is her elevated LDL at 192 and ALT 56.  Discussed importance of continued weight loss efforts  as well as healthy diet low in saturated fat and cholesterol.  Encourage patient to look at [employer]iterranean diet.  Also given information to look at Qsymia to help with weight loss.  Discussed risk associated with uncontrolled hyperlipidemia long-term.  Repeat lipid and liver profiles in 3 months, will discuss possibly adding medication on at that time if no significant improvement. Return in about 6 months (around 03/31/2018) for recheck.

## 2017-10-09 ENCOUNTER — Other Ambulatory Visit: Payer: Self-pay | Admitting: Family Medicine

## 2017-10-09 MED FILL — AMLODIPINE BESYLATE 10 MG T: 10 | 90 days supply | Qty: 90 | Fill #3

## 2017-10-10 MED FILL — DULoxetine HCL 60 MG CPEP: 60 | 90 days supply | Qty: 90 | Fill #0

## 2017-11-02 ENCOUNTER — Ambulatory Visit (INDEPENDENT_AMBULATORY_CARE_PROVIDER_SITE_OTHER): Payer: No Typology Code available for payment source | Admitting: Adult Health

## 2017-11-02 ENCOUNTER — Encounter: Payer: Self-pay | Admitting: Adult Health

## 2017-11-02 VITALS — BP 130/86 | HR 108 | Ht 64.0 in | Wt 206.0 lb

## 2017-11-02 DIAGNOSIS — Z30011 Encounter for initial prescription of contraceptive pills: Secondary | ICD-10-CM | POA: Diagnosis not present

## 2017-11-02 DIAGNOSIS — Z3202 Encounter for pregnancy test, result negative: Secondary | ICD-10-CM

## 2017-11-02 DIAGNOSIS — L732 Hidradenitis suppurativa: Secondary | ICD-10-CM

## 2017-11-02 DIAGNOSIS — Z01419 Encounter for gynecological examination (general) (routine) without abnormal findings: Secondary | ICD-10-CM | POA: Diagnosis not present

## 2017-11-02 LAB — POCT URINE PREGNANCY: Preg Test, Ur: NEGATIVE

## 2017-11-02 MED ORDER — NORETHIN-ETH ESTRAD-FE BIPHAS 1 MG-10 MCG / 10 MCG PO TABS: 1.0000 | ORAL_TABLET | Freq: Every day | ORAL | 0 refills | Status: DC

## 2017-11-02 MED ORDER — SULFAMETHOXAZOLE-TRIMETHOPRIM 800-160 MG PO TABS
1.0000 | ORAL_TABLET | Freq: Two times a day (BID) | ORAL | 3 refills | Status: DC
Start: 2017-11-02 — End: 2018-05-15

## 2017-11-02 MED FILL — SULFAMETHOXAZOLE-TMP DS TAB: 800-160 | 14 days supply | Qty: 28 | Fill #0

## 2017-11-02 NOTE — Progress Notes (Signed)
Patient ID: Hailey Holden, female   DOB: [birth year], 32 y.o.   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  PCP is Dr Gerda DissLuking.   Current Medications, Allergies, Past Medical History, Past Surgical History, Family History and Social History were reviewed in [facility] electronic medical record.     Review of Systems: Patient denies any headaches, hearing loss, fatigue, blurred vision, shortness of breath, chest pain, abdominal pain, problems with bowel movements, urination, or intercourse. No joint pain or mood swings. +weight gain Had been on taytulla but stopped, but wants to try lo loestrin   Physical Exam:BP 130/86 (BP Location: Left Arm, Patient Position: Sitting, Cuff Size: Normal)   Pulse (!) 108   Ht 5\' 4"  (1.626 m)   Wt 206 lb (93.4 kg)   LMP 10/09/2017   BMI 35.36 kg/m UPT negative.  General:  Well developed, well nourished, no acute distress Skin:  Warm and dry Neck:  Midline trachea, enlarged  thyroid, good ROM, no lymphadenopathy Lungs; Clear to auscultation bilaterally Breast:  No dominant palpable mass, retraction, or nipple discharge Cardiovascular: Regular rate and rhythm Abdomen:  Soft, non tender, no hepatosplenomegaly Pelvic:  External genitalia is normal in appearance, +hidradentisi between thighs.  The vagina is normal in appearance. Urethra has no lesions or masses. The cervix is bulbous.  Uterus is felt to be normal size, shape, and contour.  No adnexal masses or tenderness noted.Bladder is non tender, no masses felt. Extremities/musculoskeletal:  No swelling or varicosities noted, no clubbing or cyanosis Psych:  No mood changes, alert and cooperative,seems happy PHQ 2 score 0.Will rx septra ds for when has flare of hidradenitis   Impression: 1. Well woman exam with routine gynecological exam    2. Encounter for initial prescription of contraceptive pills   3. Hidradenitis   4. Pregnancy examination or test, negative result       Plan: Meds ordered this encounter  Medications  . Norethindrone-Ethinyl Estradiol-Fe Biphas (LO LOESTRIN FE) 1 MG-10 MCG / 10 MCG tablet    Sig: Take 1 tablet by mouth daily. Take 1 daily by mouth    Dispense:  4 Package    Refill:  0    BIN [id number], PCN [id number], GRP [id number],ID [id number]    Order Specific Question:   Supervising Provider    Answer:   Duane LopeEURE, LUTHER H [[id number]]  . sulfamethoxazole-trimethoprim (BACTRIM DS,SEPTRA DS) 800-160 MG tablet    Sig: Take 1 tablet by mouth 2 (two) times daily. Take 1 bid    Dispense:  28 tablet    Refill:  3    Order Specific Question:   Supervising Provider    Answer:   Lazaro ArmsEURE, LUTHER H [[id number]]  Physical in 1 year Pap in 2021 Labs with PCP Try DASH diet or [employer]

## 2017-11-02 NOTE — Patient Instructions (Signed)
DASH Eating Plan DASH stands for "Dietary Approaches to Stop Hypertension." The DASH eating plan is a healthy eating plan that has been shown to reduce high blood pressure (hypertension). It may also reduce your risk for type 2 diabetes, heart disease, and stroke. The DASH eating plan may also help with weight loss. What are tips for following this plan? General guidelines  Avoid eating more than 2,300 mg (milligrams) of salt (sodium) a day. If you have hypertension, you may need to reduce your sodium intake to 1,500 mg a day.  Limit alcohol intake to no more than 1 drink a day for nonpregnant women and 2 drinks a day for men. One drink equals 12 oz of beer, 5 oz of wine, or 1 oz of hard liquor.  Work with your health care provider to maintain a healthy body weight or to lose weight. Ask what an ideal weight is for you.  Get at least 30 minutes of exercise that causes your heart to beat faster (aerobic exercise) most days of the week. Activities may include walking, swimming, or biking.  Work with your health care provider or diet and nutrition specialist (dietitian) to adjust your eating plan to your individual calorie needs. Reading food labels  Check food labels for the amount of sodium per serving. Choose foods with less than 5 percent of the Daily Value of sodium. Generally, foods with less than 300 mg of sodium per serving fit into this eating plan.  To find whole grains, look for the word "whole" as the first word in the ingredient list. Shopping  Buy products labeled as "low-sodium" or "no salt added."  Buy fresh foods. Avoid canned foods and premade or frozen meals. Cooking  Avoid adding salt when cooking. Use salt-free seasonings or herbs instead of table salt or sea salt. Check with your health care provider or pharmacist before using salt substitutes.  Do not fry foods. Cook foods using healthy methods such as baking, boiling, grilling, and broiling instead.  Cook with  heart-healthy oils, such as olive, canola, soybean, or sunflower oil. Meal planning   Eat a balanced diet that includes: ? 5 or more servings of fruits and vegetables each day. At each meal, try to fill half of your plate with fruits and vegetables. ? Up to 6-8 servings of whole grains each day. ? Less than 6 oz of lean meat, poultry, or fish each day. A 3-oz serving of meat is about the same size as a deck of cards. One egg equals 1 oz. ? 2 servings of low-fat dairy each day. ? A serving of nuts, seeds, or beans 5 times each week. ? Heart-healthy fats. Healthy fats called Omega-3 fatty acids are found in foods such as flaxseeds and coldwater fish, like sardines, salmon, and mackerel.  Limit how much you eat of the following: ? Canned or prepackaged foods. ? Food that is high in trans fat, such as fried foods. ? Food that is high in saturated fat, such as fatty meat. ? Sweets, desserts, sugary drinks, and other foods with added sugar. ? Full-fat dairy products.  Do not salt foods before eating.  Try to eat at least 2 vegetarian meals each week.  Eat more home-cooked food and less restaurant, buffet, and fast food.  When eating at a restaurant, ask that your food be prepared with less salt or no salt, if possible. What foods are recommended? The items listed may not be a complete list. Talk with your dietitian about what   dietary choices are best for you. Grains Whole-grain or whole-wheat bread. Whole-grain or whole-wheat pasta. Brown rice. Oatmeal. Quinoa. Bulgur. Whole-grain and low-sodium cereals. Pita bread. Low-fat, low-sodium crackers. Whole-wheat flour tortillas. Vegetables Fresh or frozen vegetables (raw, steamed, roasted, or grilled). Low-sodium or reduced-sodium tomato and vegetable juice. Low-sodium or reduced-sodium tomato sauce and tomato paste. Low-sodium or reduced-sodium canned vegetables. Fruits All fresh, dried, or frozen fruit. Canned fruit in natural juice (without  added sugar). Meat and other protein foods Skinless chicken or turkey. Ground chicken or turkey. Pork with fat trimmed off. Fish and seafood. Egg whites. Dried beans, peas, or lentils. Unsalted nuts, nut butters, and seeds. Unsalted canned beans. Lean cuts of beef with fat trimmed off. Low-sodium, lean deli meat. Dairy Low-fat (1%) or fat-free (skim) milk. Fat-free, low-fat, or reduced-fat cheeses. Nonfat, low-sodium ricotta or cottage cheese. Low-fat or nonfat yogurt. Low-fat, low-sodium cheese. Fats and oils Soft margarine without trans fats. Vegetable oil. Low-fat, reduced-fat, or light mayonnaise and salad dressings (reduced-sodium). Canola, safflower, olive, soybean, and sunflower oils. Avocado. Seasoning and other foods Herbs. Spices. Seasoning mixes without salt. Unsalted popcorn and pretzels. Fat-free sweets. What foods are not recommended? The items listed may not be a complete list. Talk with your dietitian about what dietary choices are best for you. Grains Baked goods made with fat, such as croissants, muffins, or some breads. Dry pasta or rice meal packs. Vegetables Creamed or fried vegetables. Vegetables in a cheese sauce. Regular canned vegetables (not low-sodium or reduced-sodium). Regular canned tomato sauce and paste (not low-sodium or reduced-sodium). Regular tomato and vegetable juice (not low-sodium or reduced-sodium). Pickles. Olives. Fruits Canned fruit in a light or heavy syrup. Fried fruit. Fruit in cream or butter sauce. Meat and other protein foods Fatty cuts of meat. Ribs. Fried meat. Bacon. Sausage. Bologna and other processed lunch meats. Salami. Fatback. Hotdogs. Bratwurst. Salted nuts and seeds. Canned beans with added salt. Canned or smoked fish. Whole eggs or egg yolks. Chicken or turkey with skin. Dairy Whole or 2% milk, cream, and half-and-half. Whole or full-fat cream cheese. Whole-fat or sweetened yogurt. Full-fat cheese. Nondairy creamers. Whipped toppings.  Processed cheese and cheese spreads. Fats and oils Butter. Stick margarine. Lard. Shortening. Ghee. Bacon fat. Tropical oils, such as coconut, palm kernel, or palm oil. Seasoning and other foods Salted popcorn and pretzels. Onion salt, garlic salt, seasoned salt, table salt, and sea salt. Worcestershire sauce. Tartar sauce. Barbecue sauce. Teriyaki sauce. Soy sauce, including reduced-sodium. Steak sauce. Canned and packaged gravies. Fish sauce. Oyster sauce. Cocktail sauce. Horseradish that you find on the shelf. Ketchup. Mustard. Meat flavorings and tenderizers. Bouillon cubes. Hot sauce and Tabasco sauce. Premade or packaged marinades. Premade or packaged taco seasonings. Relishes. Regular salad dressings. Where to find more information:  National Heart, Lung, and Blood Institute: www.nhlbi.nih.gov  American Heart Association: www.heart.org Summary  The DASH eating plan is a healthy eating plan that has been shown to reduce high blood pressure (hypertension). It may also reduce your risk for type 2 diabetes, heart disease, and stroke.  With the DASH eating plan, you should limit salt (sodium) intake to 2,300 mg a day. If you have hypertension, you may need to reduce your sodium intake to 1,500 mg a day.  When on the DASH eating plan, aim to eat more fresh fruits and vegetables, whole grains, lean proteins, low-fat dairy, and heart-healthy fats.  Work with your health care provider or diet and nutrition specialist (dietitian) to adjust your eating plan to your individual   calorie needs. This information is not intended to replace advice given to you by your health care provider. Make sure you discuss any questions you have with your health care provider. Document Released: 03/10/2011 Document Revised: 03/14/2016 Document Reviewed: 03/14/2016 Elsevier Interactive Patient Education  2018 Elsevier Inc.  

## 2017-11-27 MED FILL — VENTOLIN HFA 90 MCG INHALER: 108 (90 BAS | 30 days supply | Qty: 18 | Fill #3

## 2018-01-09 ENCOUNTER — Encounter: Payer: Self-pay | Admitting: Family Medicine

## 2018-01-09 ENCOUNTER — Ambulatory Visit (INDEPENDENT_AMBULATORY_CARE_PROVIDER_SITE_OTHER): Payer: No Typology Code available for payment source | Admitting: Family Medicine

## 2018-01-09 VITALS — BP 118/80 | Ht 63.5 in | Wt 204.0 lb

## 2018-01-09 DIAGNOSIS — G8929 Other chronic pain: Secondary | ICD-10-CM | POA: Diagnosis not present

## 2018-01-09 DIAGNOSIS — E785 Hyperlipidemia, unspecified: Secondary | ICD-10-CM

## 2018-01-09 DIAGNOSIS — M545 Low back pain, unspecified: Secondary | ICD-10-CM

## 2018-01-09 MED ORDER — TIZANIDINE HCL 4 MG PO TABS: ORAL_TABLET | ORAL | 1 refills | Status: DC

## 2018-01-09 MED ORDER — ETODOLAC 400 MG PO TABS: 400.0000 mg | ORAL_TABLET | Freq: Two times a day (BID) | ORAL | 2 refills | Status: DC

## 2018-01-09 MED FILL — ETODOLAC 400 MG TABLET: 400 | 14 days supply | Qty: 28 | Fill #0

## 2018-01-09 MED FILL — tiZANidine HCL 4 MG TABS: 4 | 10 days supply | Qty: 30 | Fill #0

## 2018-01-09 NOTE — Progress Notes (Signed)
   Subjective:    Patient ID: Hailey Holden, female    DOB: Mar 27, 1986, 32 y.o.   MRN: [id number]  HPI  Patient is here today due to back pain she has had this off and on for around four years, with it getting progressively worse. She states last Tuesday she reports it was so bad she could not walk.   pain off nd on for the past four yrs  Lower back pain  Gets tight and feels like a deep knwot   Intermittent in nature,  When not flaring up, is mostly symtom free  Stretching and massage usually helps  Severe stabbing pain in the lowe back   Took ibu 800 mg twice per day  Tying not to take to mu h   Went to chiropractor Friday    chiro was concerned about a lipoma over the muscle  chiro did not rec surg  Did TENS and slight adjustment   xryw wre done and showed  A slight curve   chrio proposed adjustments and rec potential exercises           She states it starts lower right hip,but does not radiate down her leg.She states its a sharp stabbing pain. She states she is taking Ibuprofen, biofreeze and ice packs.  Review of Systems No headache, no major weight loss or weight gain, no chest pain no back pain abdominal pain no change in bowel habits complete ROS otherwise negative     Objective:   Physical Exam  Alert and oriented, vitals reviewed and stable, NAD ENT-TM's and ext canals WNL bilat via otoscopic exam Soft palate, tonsils and post pharynx WNL via oropharyngeal exam Neck-symmetric, no masses; thyroid nonpalpable and nontender Pulmonary-no tachypnea or accessory muscle use; Clear without wheezes via auscultation Card--no abnrml murmurs, rhythm reg and rate WNL Carotid pulses symmetric, without bruits Negative straight leg raise.  No some tenderness right SI joint.  No spinal tenderness.      Assessment & Plan:  Impression 1 chronic low back pain discussed.  X-ray via chiropractor showed "mild curvature".  Chiropractor but concerned about why  none evident on exam.  Chronic pain discussed.  Likely ligamentous and muscular.  Add Zanaflex loading.  Flares.  Start stretching and strengthening exercises faithfully rationale discussed  2.  Hyperlipidemia follow-up blood work discussed.  If follow-up LDL substantial office visit for discussion  Greater than 50% of this 25 minute face to face visit was spent in counseling and discussion and coordination of care regarding the above diagnosis/diagnosies

## 2018-01-09 NOTE — Patient Instructions (Signed)

## 2018-01-12 ENCOUNTER — Other Ambulatory Visit: Payer: Self-pay | Admitting: Adult Health

## 2018-01-12 ENCOUNTER — Other Ambulatory Visit: Payer: Self-pay | Admitting: Family Medicine

## 2018-01-12 MED FILL — DULoxetine HCL 60 MG CPEP: 60 | 90 days supply | Qty: 90 | Fill #0

## 2018-01-15 MED FILL — AMLODIPINE BESYLATE 10 MG T: 10 | 90 days supply | Qty: 90 | Fill #0

## 2018-01-22 MED FILL — VENTOLIN HFA 90 MCG INHALER: 108 (90 BAS | 30 days supply | Qty: 18 | Fill #4

## 2018-02-16 ENCOUNTER — Other Ambulatory Visit: Payer: No Typology Code available for payment source

## 2018-02-16 DIAGNOSIS — E063 Autoimmune thyroiditis: Secondary | ICD-10-CM

## 2018-02-17 LAB — HEPATIC FUNCTION PANEL
ALT: 41 IU/L — ABNORMAL HIGH (ref 0–32)
AST: 39 IU/L (ref 0–40)
Albumin: 4.4 g/dL (ref 3.5–5.5)
Alkaline Phosphatase: 159 IU/L — ABNORMAL HIGH (ref 39–117)
Bilirubin Total: 0.7 mg/dL (ref 0.0–1.2)
Bilirubin, Direct: 0.14 mg/dL (ref 0.00–0.40)
Total Protein: 7.3 g/dL (ref 6.0–8.5)

## 2018-02-17 LAB — LIPID PANEL
CHOL/HDL RATIO: 5.9 ratio — AB (ref 0.0–4.4)
Cholesterol, Total: 252 mg/dL — ABNORMAL HIGH (ref 100–199)
HDL: 43 mg/dL (ref >39)
LDL CALC: 189 mg/dL — AB (ref 0–99)
TRIGLYCERIDES: 100 mg/dL (ref 0–149)
VLDL Cholesterol Cal: 20 mg/dL (ref 5–40)

## 2018-02-17 LAB — TSH: TSH: 2.69 u[IU]/mL (ref 0.450–4.500)

## 2018-02-17 LAB — T3, FREE: T3, Free: 3.6 pg/mL (ref 2.0–4.4)

## 2018-02-17 LAB — T4, FREE: Free T4: 1.2 ng/dL (ref 0.82–1.77)

## 2018-02-22 ENCOUNTER — Other Ambulatory Visit: Payer: Self-pay | Admitting: Adult Health

## 2018-02-22 MED ORDER — LEVOTHYROXINE SODIUM 25 MCG PO TABS: 25.0000 ug | ORAL_TABLET | Freq: Every day | ORAL | 3 refills | Status: DC

## 2018-02-22 NOTE — Progress Notes (Signed)
Will rx synthroid 25 mcg and recheck labs in 8 weeks

## 2018-02-26 MED FILL — VENTOLIN HFA 90 MCG INHALER: 108 (90 BAS | 30 days supply | Qty: 18 | Fill #5

## 2018-02-28 ENCOUNTER — Other Ambulatory Visit: Payer: Self-pay | Admitting: Adult Health

## 2018-03-12 ENCOUNTER — Ambulatory Visit: Payer: No Typology Code available for payment source | Admitting: Family Medicine

## 2018-03-23 MED FILL — SULFAMETHOXAZOLE-TMP DS TAB: 800-160 | 14 days supply | Qty: 28 | Fill #1

## 2018-04-10 ENCOUNTER — Other Ambulatory Visit: Payer: Self-pay | Admitting: Family Medicine

## 2018-04-10 MED FILL — AMLODIPINE BESYLATE 10 MG T: 10 | 90 days supply | Qty: 90 | Fill #1

## 2018-04-11 MED FILL — DULoxetine HCL 60 MG CPEP: 60 | 90 days supply | Qty: 90 | Fill #0

## 2018-04-23 MED FILL — LEVOTHYROXINE 25 MCG TABLET: 25 | 30 days supply | Qty: 30 | Fill #0

## 2018-04-23 MED FILL — HYDROCHLOROTHIAZIDE 12.5 MG: 12.5 | 30 days supply | Qty: 30 | Fill #0

## 2018-05-01 ENCOUNTER — Encounter (INDEPENDENT_AMBULATORY_CARE_PROVIDER_SITE_OTHER): Payer: No Typology Code available for payment source

## 2018-05-05 ENCOUNTER — Other Ambulatory Visit: Payer: Self-pay

## 2018-05-05 ENCOUNTER — Encounter (HOSPITAL_COMMUNITY): Payer: Self-pay | Admitting: *Deleted

## 2018-05-05 ENCOUNTER — Emergency Department (HOSPITAL_COMMUNITY)
Admission: EM | Admit: 2018-05-05 | Discharge: 2018-05-05 | Disposition: A | Discharge status: Home / Self Care | Payer: No Typology Code available for payment source | Attending: Emergency Medicine | Admitting: Emergency Medicine

## 2018-05-05 DIAGNOSIS — R202 Paresthesia of skin: Secondary | ICD-10-CM | POA: Insufficient documentation

## 2018-05-05 DIAGNOSIS — I10 Essential (primary) hypertension: Secondary | ICD-10-CM | POA: Insufficient documentation

## 2018-05-05 DIAGNOSIS — J45909 Unspecified asthma, uncomplicated: Secondary | ICD-10-CM | POA: Diagnosis not present

## 2018-05-05 DIAGNOSIS — E785 Hyperlipidemia, unspecified: Secondary | ICD-10-CM | POA: Insufficient documentation
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
## 2018-05-05 NOTE — Discharge Instructions (Signed)
Return if any problems.

## 2018-05-10 ENCOUNTER — Other Ambulatory Visit: Payer: Self-pay | Admitting: Adult Health

## 2018-05-11 MED FILL — VENTOLIN HFA 90 MCG INHALER: 108 (90 BAS | 17 days supply | Qty: 18 | Fill #0 | Status: TO

## 2018-05-15 ENCOUNTER — Encounter (INDEPENDENT_AMBULATORY_CARE_PROVIDER_SITE_OTHER): Payer: Self-pay | Admitting: Bariatrics

## 2018-05-15 ENCOUNTER — Ambulatory Visit (INDEPENDENT_AMBULATORY_CARE_PROVIDER_SITE_OTHER): Payer: No Typology Code available for payment source | Admitting: Bariatrics

## 2018-05-15 VITALS — BP 127/75 | HR 89 | Temp 98.3°F | Ht 64.0 in | Wt 204.0 lb

## 2018-05-15 DIAGNOSIS — E559 Vitamin D deficiency, unspecified: Secondary | ICD-10-CM

## 2018-05-15 DIAGNOSIS — Z6835 Body mass index (BMI) 35.0-35.9, adult: Secondary | ICD-10-CM

## 2018-05-15 DIAGNOSIS — I10 Essential (primary) hypertension: Secondary | ICD-10-CM | POA: Diagnosis not present

## 2018-05-15 DIAGNOSIS — R0602 Shortness of breath: Secondary | ICD-10-CM | POA: Diagnosis not present

## 2018-05-15 DIAGNOSIS — E038 Other specified hypothyroidism: Secondary | ICD-10-CM

## 2018-05-15 DIAGNOSIS — R5383 Other fatigue: Secondary | ICD-10-CM

## 2018-05-15 DIAGNOSIS — Z0289 Encounter for other administrative examinations: Secondary | ICD-10-CM

## 2018-05-15 DIAGNOSIS — Z9189 Other specified personal risk factors, not elsewhere classified: Secondary | ICD-10-CM | POA: Diagnosis not present

## 2018-05-15 DIAGNOSIS — E538 Deficiency of other specified B group vitamins: Secondary | ICD-10-CM

## 2018-05-15 DIAGNOSIS — Z1331 Encounter for screening for depression: Secondary | ICD-10-CM

## 2018-05-15 DIAGNOSIS — R7309 Other abnormal glucose: Secondary | ICD-10-CM

## 2018-05-16 LAB — COMPREHENSIVE METABOLIC PANEL WITH GFR
ALT: 28 [IU]/L (ref 0–32)
AST: 24 [IU]/L (ref 0–40)
Albumin/Globulin Ratio: 1.7 (ref 1.2–2.2)
Albumin: 4.5 g/dL (ref 3.8–4.8)
Alkaline Phosphatase: 140 [IU]/L — ABNORMAL HIGH (ref 39–117)
BUN/Creatinine Ratio: 11 (ref 9–23)
BUN: 7 mg/dL (ref 6–20)
Bilirubin Total: 0.7 mg/dL (ref 0.0–1.2)
CO2: 23 mmol/L (ref 20–29)
Calcium: 9.3 mg/dL (ref 8.7–10.2)
Chloride: 99 mmol/L (ref 96–106)
Creatinine, Ser: 0.61 mg/dL (ref 0.57–1.00)
GFR calc Af Amer: 138 mL/min/{1.73_m2}
GFR calc non Af Amer: 119 mL/min/{1.73_m2}
Globulin, Total: 2.6 g/dL (ref 1.5–4.5)
Glucose: 83 mg/dL (ref 65–99)
Potassium: 3.5 mmol/L (ref 3.5–5.2)
Sodium: 140 mmol/L (ref 134–144)
Total Protein: 7.1 g/dL (ref 6.0–8.5)

## 2018-05-16 LAB — INSULIN, RANDOM: INSULIN: 13.7 u[IU]/mL (ref 2.6–24.9)

## 2018-05-16 LAB — HEMOGLOBIN A1C
ESTIMATED AVERAGE GLUCOSE: 108 mg/dL
Hgb A1c MFr Bld: 5.4 % (ref 4.8–5.6)

## 2018-05-16 LAB — T3: T3, Total: 161 ng/dL (ref 71–180)

## 2018-05-16 LAB — TSH: TSH: 2.11 u[IU]/mL (ref 0.450–4.500)

## 2018-05-16 LAB — T4, FREE: Free T4: 1.29 ng/dL (ref 0.82–1.77)

## 2018-05-16 LAB — VITAMIN B12: Vitamin B-12: 668 pg/mL (ref 232–1245)

## 2018-05-16 LAB — VITAMIN D 25 HYDROXY (VIT D DEFICIENCY, FRACTURES): Vit D, 25-Hydroxy: 52.5 ng/mL (ref 30.0–100.0)

## 2018-05-16 NOTE — Progress Notes (Signed)
Office: [phone]  /  Fax: [phone]   Dear Dr. Gerda Diss,   Thank you for referring Hailey Holden to our clinic. The following note includes my evaluation and treatment recommendations.  HPI:   Chief Complaint: OBESITY    [name] has been referred by Vilinda Blanks. Gerda Diss, MD for consultation regarding her obesity and obesity related comorbidities.    CYANI Holden (MR# [id number]) is a 33 y.o. female who presents on 05/15/2018 for obesity evaluation and treatment. Current BMI is Body mass index is 35.02 kg/m.Hailey Holden has been struggling with her weight for many years and has been unsuccessful in either losing weight, maintaining weight loss, or reaching her healthy weight goal.     Hailey Holden attended our information session and states she is currently in the action stage of change and ready to dedicate time achieving and maintaining a healthier weight. Hailey Holden is interested in becoming our patient and working on intensive lifestyle modifications including (but not limited to) diet, exercise and weight loss.    Hailey Holden states her family eats meals together she thinks her family will eat healthier with  her her desired weight loss is 54 lbs. she has been heavy most of  her life she started gaining weight in nursing school and after babies her heaviest weight ever was 208.6 lbs. she has significant food cravings issues  she snacks frequently in the evenings she is frequently drinking liquids with calories she sometimes makes poor food choices she sometimes eats larger portions than normal  she has binge eating behaviors she struggles with emotional eating    Fatigue Hailey Holden feels her energy is lower than it should be. This has worsened with weight gain and has not worsened recently. Her fatigue is intermittent and she has problems with sleep. Hailey Holden denies daytime somnolence and she admits to waking up still tired. Patient is at risk for obstructive sleep apnea. Patent has a  history of symptoms of morning fatigue, morning headache and hypertension. Patient generally gets 7 hours of sleep per night, and states they generally have restless sleep. Snoring is present. Apneic episodes are present. Epworth Sleepiness Score is 1  Dyspnea on exertion Hailey Holden notes increasing shortness of breath with certain activities and seems to be worsening over time with weight gain. She notes getting out of breath sooner with activity than she used to. This has not gotten worse recently. Tyresha denies orthopnea.  Hypertension Hailey Holden is a 33 y.o. female with hypertension. She is taking Amlodipine and HCTZ. Hailey Holden denies chest pain. She is working weight loss to help control her blood pressure with the goal of decreasing her risk of heart attack and stroke. Hailey Holden blood pressure is currently controlled.  Hypothyroidism Hailey Holden has a diagnosis of hypothyroidism. She was diagnosed with Hashimoto's in July 2018. She is on levothyroxine (started late November). She admits hot or cold intolerance.   Vitamin D deficiency Hailey Holden has a diagnosis of vitamin D deficiency. She is currently taking vit D and denies nausea, vomiting or muscle weakness.  At risk for osteopenia and osteoporosis Hailey Holden is at higher risk of osteopenia and osteoporosis due to vitamin D deficiency.   B12 Deficiency Hailey Holden has a diagnosis of B12 insufficiency and notes fatigue. This is not a new diagnosis. She is currently taking B12 supplement. Hailey Holden is not a vegetarian and does not have a previous diagnosis of pernicious anemia. She does not have a history of weight loss surgery.   Elevated Glucose Hailey Holden has  a history of some elevated blood glucose readings without a diagnosis of diabetes. She denies polyphagia.  Depression Screen [name]'s Food and Mood (modified PHQ-9) score was  Depression screen PHQ 2/9 05/15/2018  Decreased Interest 1  Down, Depressed, Hopeless 1  PHQ - 2 Score 2  Altered  sleeping 1  Tired, decreased energy 2  Change in appetite 2  Feeling bad or failure about yourself  1  Trouble concentrating 2  Moving slowly or fidgety/restless 0  Suicidal thoughts 0  PHQ-9 Score 10  Difficult doing work/chores Not difficult at all    ASSESSMENT AND PLAN:  Other fatigue  Shortness of breath on exertion  Essential hypertension  Other specified hypothyroidism - Plan: T3, T4, free, TSH  Vitamin D deficiency - Plan: VITAMIN D 25 Hydroxy (Vit-D Deficiency, Fractures)  B12 nutritional deficiency - Plan: Vitamin B12  Elevated glucose - Plan: Hemoglobin A1c, Insulin, random, Comprehensive metabolic panel  Depression screening  At risk for osteoporosis  Class 2 severe obesity with serious comorbidity and body mass index (BMI) of 35.0 to 35.9 in adult, unspecified obesity type (HCC)  PLAN:  Fatigue Hailey Sheldonshley was informed that her fatigue may be related to obesity, depression or many other causes. Labs will be ordered, and in the meanwhile Hailey Sheldonshley has agreed to work on diet, exercise and weight loss to help with fatigue. Proper sleep hygiene was discussed including the need for 7-8 hours of quality sleep each night. A sleep study was not ordered based on symptoms and Epworth score.  Dyspnea on exertion [name]'s shortness of breath appears to be obesity related and exercise induced. She has agreed to work on weight loss and gradually increase exercise/activity to treat her exercise induced shortness of breath. If Hailey Sheldonshley follows our instructions and loses weight without improvement of her shortness of breath, we will plan to refer to pulmonology. We will monitor this condition regularly. Hailey Sheldonshley agrees to this plan.  Hypertension We discussed sodium restriction, working on healthy weight loss, and a regular exercise program as the means to achieve improved blood pressure control. Hailey Sheldonshley agreed with this plan and agreed to follow up as directed. We will continue to monitor  her blood pressure as well as her progress with the above lifestyle modifications. She will continue her medications as prescribed and will watch for signs of hypotension as she continues her lifestyle modifications.  Hypothyroidism Hailey Sheldonshley was informed of the importance of good thyroid control to help with weight loss efforts. She was also informed that supertheraputic thyroid levels are dangerous and will not improve weight loss results. We will check thyroid panel today and Hailey Sheldonshley will follow up as directed.  Vitamin D Deficiency Hailey Sheldonshley was informed that low vitamin D levels contributes to fatigue and are associated with obesity, breast, and colon cancer. She will continue vitamin D and will follow up for routine testing of vitamin D, at least 2-3 times per year. She was informed of the risk of over-replacement of vitamin D and agrees to not increase her dose unless she discusses this with us first. We will check vitamin D level today and Hailey Sheldonshley will follow up as directed.  At risk for osteopenia and osteoporosis Hailey Sheldonshley was given extended  (15 minutes) osteoporosis prevention counseling today. Hailey Sheldonshley is at risk for osteopenia and osteoporosis due to her vitamin D deficiency. She was encouraged to take her vitamin D and follow her higher calcium diet and increase strengthening exercise to help strengthen her bones and decrease her risk of osteopenia and  osteoporosis.  B12 Deficiency Marline will work on increasing B12 rich foods in her diet. B12 supplementation was not prescribed today. We will check B12 level today and Tsion will follow up as directed.  Elevated Glucose Fasting labs will be obtained (Hgb A1c, insulin level) and results with be discussed with Hailey Holden in 2 weeks at her follow up visit. In the meanwhile Ericia was started on a lower simple carbohydrate diet and will work on weight loss efforts.  Depression Screen Luverna had a moderately positive depression screening. Depression is  commonly associated with obesity and often results in emotional eating behaviors. We will monitor this closely and work on CBT to help improve the non-hunger eating patterns. Referral to Psychology may be required if no improvement is seen as she continues in our clinic.  Obesity Terrence is currently in the action stage of change and her goal is to continue with weight loss efforts. I recommend Luellen begin the structured treatment plan as follows:  She has agreed to follow the Category 3 plan Lorriane has been instructed to eventually work up to a goal of 150 minutes of combined cardio and strengthening exercise per week for weight loss and overall health benefits. We discussed the following Behavioral Modification Strategies today: increase H2O intake, keeping healthy foods in the home, increasing lean protein intake, decreasing simple carbohydrates, increasing vegetables, decrease eating out and work on meal planning and easy cooking plans   She was informed of the importance of frequent follow up visits to maximize her success with intensive lifestyle modifications for her multiple health conditions. She was informed we would discuss her lab results at her next visit unless there is a critical issue that needs to be addressed sooner. Rosetta agreed to keep her next visit at the agreed upon time to discuss these results.  ALLERGIES: Allergies  Allergen Reactions  . Clindamycin/Lincomycin Shortness Of Breath and Rash  . Tdap [Tetanus-Diphth-Acell Pertussis] Itching    Itching, swelling in the arm, chest tightness, wheezing, headache, SOB  . Latex   . Penicillins     MEDICATIONS: Current Outpatient Medications on File Prior to Visit  Medication Sig Dispense Refill  . acetaminophen (TYLENOL) 500 MG tablet Take 500 mg by mouth every 6 (six) hours as needed.    Marland Kitchen albuterol (VENTOLIN HFA) 108 (90 Base) MCG/ACT inhaler INHALE 2 PUFFS BY MOUTH EVERY 4 HOURS AS NEEDED FOR WHEEZING 18 g 6  .  amLODipine (NORVASC) 10 MG tablet TAKE 1 TABLET (10 MG TOTAL) BY MOUTH DAILY. 90 tablet 3  . cetirizine (ZYRTEC) 10 MG tablet Take 10 mg by mouth daily.    . cholecalciferol (VITAMIN D) 1000 units tablet Take 5,000 Units by mouth daily.     . DULoxetine (CYMBALTA) 60 MG capsule TAKE 1 CAPSULE BY MOUTH DAILY. 90 capsule 0  . etodolac (LODINE) 400 MG tablet Take 1 tablet (400 mg total) by mouth 2 (two) times daily. 28 tablet 2  . hydrochlorothiazide (MICROZIDE) 12.5 MG capsule TAKE ONE CAPSULE BY MOUTH DAILY 30 capsule 12  . ibuprofen (ADVIL,MOTRIN) 200 MG tablet Take 200 mg by mouth every 6 (six) hours as needed.    Marland Kitchen levothyroxine (SYNTHROID, LEVOTHROID) 25 MCG tablet Take 1 tablet (25 mcg total) by mouth daily before breakfast. 30 tablet 3  . Naproxen Sodium (ALEVE PO) Take by mouth as needed.    Marland Kitchen tiZANidine (ZANAFLEX) 4 MG tablet One p o up to tid prn pain 30 tablet 1  . valACYclovir (VALTREX) 1000 MG  tablet Take 1,000 mg by mouth once as needed.    . vitamin B-12 (CYANOCOBALAMIN) 500 MCG tablet Take 500 mcg by mouth daily.     No current facility-administered medications on file prior to visit.     PAST MEDICAL HISTORY: Past Medical History:  Diagnosis Date  . Allergies   . Asthma   . Back pain   . Breast nodule 02/04/2014  . Constipation   . Constipation   . Depression   . Dizziness   . Dry skin   . Easy bruising   . Encounter for other contraceptive management 10/21/2014  . Enlarged thyroid 09/27/2012  . Fatigue   . Fatty liver   . Fibromyalgia   . Floaters in visual field   . Glands swollen   . Hashimoto's disease   . Headache   . Heat intolerance   . Hidradenitis   . High cholesterol   . Hoarseness   . Hypertension   . Hypothyroidism   . IBS (irritable bowel syndrome)   . Joint pain   . Leg cramping   . Migraine headache   . Muscle stiffness   . Myalgia   . Nausea   . PMS (premenstrual syndrome) 09/27/2012  . Stress   . Swelling of lower extremity   .  Tachycardia    patient states she has long history of resting tachycardia  . Thyroid nodule   . Uterine prolapse 10/21/2014  . Vaginal irritation 10/08/2015  . Vision changes   . Wheezing   . Yeast infection 10/08/2015    PAST SURGICAL HISTORY: History reviewed. No pertinent surgical history.  SOCIAL HISTORY: Social History   Tobacco Use  . Smoking status: Never Smoker  . Smokeless tobacco: Never Used  Substance Use Topics  . Alcohol use: No  . Drug use: No    FAMILY HISTORY: Family History  Problem Relation Age of Onset  . Diabetes Mother   . Hypertension Mother   . Fibromyalgia Mother   . Obesity Mother   . Heart disease Father   . Hypertension Father   . Hyperlipidemia Father   . Obesity Father   . Sleep apnea Father   . Hypertension Maternal Grandmother   . Vision loss Maternal Grandmother   . Glaucoma Maternal Grandmother   . Stroke Maternal Grandmother   . Cancer Maternal Grandfather        melonoma  . Heart disease Paternal Grandmother   . Hypertension Paternal Grandmother   . Hyperlipidemia Paternal Grandmother   . Other Paternal Grandmother        macular degeneration  . Cancer Paternal Grandfather        lung  . Cancer Maternal Uncle        lung  . Colon cancer Maternal Uncle   . Cancer Paternal Aunt        breast    ROS: Review of Systems  Constitutional: Positive for malaise/fatigue.  HENT:       + Hoarseness  Eyes:       + Vision Changes + Wear Glasses or Contacts + Floaters  Respiratory: Positive for shortness of breath (on exertion) and wheezing.   Cardiovascular: Negative for orthopnea.       + Leg Cramping  Gastrointestinal: Positive for constipation and nausea. Negative for vomiting.  Musculoskeletal:       Negative for muscle weakness + Swollen Glands + Muscle or Joint Pain + Muscle Stiffness  Skin:       + Dryness  Neurological:  Positive for dizziness and headaches.  Endo/Heme/Allergies: Bruises/bleeds easily (bruising).        Negative for polyphagia + Heat or Cold Intolerance  Psychiatric/Behavioral:       + Stress     PHYSICAL EXAM: Blood pressure 127/75, pulse 89, temperature 98.3 F (36.8 C), temperature source Oral, height 5\' 4"  (1.626 m), weight 204 lb (92.5 kg), last menstrual period 05/04/2018, SpO2 97 %. Body mass index is 35.02 kg/m. Physical Exam Vitals signs reviewed.  Constitutional:      Appearance: Normal appearance. She is well-developed. She is obese.  HENT:     Head: Normocephalic and atraumatic.     Nose: Nose normal.     Mouth/Throat:     Comments: Mallampati = 2 Eyes:     General: No scleral icterus.    Extraocular Movements: Extraocular movements intact.  Neck:     Musculoskeletal: Normal range of motion and neck supple.     Thyroid: No thyromegaly.  Cardiovascular:     Rate and Rhythm: Normal rate and regular rhythm.  Pulmonary:     Effort: Pulmonary effort is normal. No respiratory distress.  Abdominal:     Palpations: Abdomen is soft.     Tenderness: There is no abdominal tenderness.  Musculoskeletal: Normal range of motion.     Right lower leg: Edema (trace) present.     Left lower leg: Edema present.     Comments: + Support Hose  Skin:    General: Skin is warm and dry.  Neurological:     Mental Status: She is alert and oriented to person, place, and time.     Coordination: Coordination normal.  Psychiatric:        Mood and Affect: Mood normal.        Behavior: Behavior normal.     RECENT LABS AND TESTS: BMET    Component Value Date/Time   NA 140 05/15/2018 1203   K 3.5 05/15/2018 1203   CL 99 05/15/2018 1203   CO2 23 05/15/2018 1203   GLUCOSE 83 05/15/2018 1203   GLUCOSE 81 10/17/2013 0901   BUN 7 05/15/2018 1203   CREATININE 0.61 05/15/2018 1203   CREATININE 0.55 10/17/2013 0901   CALCIUM 9.3 05/15/2018 1203   GFRNONAA 119 05/15/2018 1203   GFRAA 138 05/15/2018 1203   Lab Results  Component Value Date   HGBA1C 5.4 05/15/2018   Lab Results    Component Value Date   INSULIN 13.7 05/15/2018   CBC    Component Value Date/Time   WBC 12.3 (H) 10/27/2016 1653   WBC 6.7 10/17/2013 0901   RBC 4.69 10/27/2016 1653   RBC 4.67 10/17/2013 0901   HGB 13.3 10/27/2016 1653   HCT 40.5 10/27/2016 1653   PLT 434 (H) 10/27/2016 1653   MCV 86 10/27/2016 1653   MCH 28.4 10/27/2016 1653   MCH 29.1 10/17/2013 0901   MCHC 32.8 10/27/2016 1653   MCHC 35.1 10/17/2013 0901   RDW 13.1 10/27/2016 1653   Iron/TIBC/Ferritin/ %Sat No results found for: IRON, TIBC, FERRITIN, IRONPCTSAT Lipid Panel     Component Value Date/Time   CHOL 252 (H) 02/16/2018 0812   TRIG 100 02/16/2018 0812   HDL 43 02/16/2018 0812   CHOLHDL 5.9 (H) 02/16/2018 0812   CHOLHDL 4.4 10/17/2013 0901   VLDL 16 10/17/2013 0901   LDLCALC 189 (H) 02/16/2018 0812   Hepatic Function Panel     Component Value Date/Time   PROT 7.1 05/15/2018 1203   ALBUMIN 4.5 05/15/2018  1203   AST 24 05/15/2018 1203   ALT 28 05/15/2018 1203   ALKPHOS 140 (H) 05/15/2018 1203   BILITOT 0.7 05/15/2018 1203   BILIDIR 0.14 02/16/2018 0812      Component Value Date/Time   TSH 2.110 05/15/2018 1203   TSH 2.690 02/16/2018 0953   TSH 1.780 09/27/2017 0912    Ref. Range 09/27/2017 09:12  Vitamin D, 25-Hydroxy Latest Ref Range: 30.0 - 100.0 ng/mL 50.6    INDIRECT CALORIMETER done today shows a VO2 of 304 and a REE of 2117.  Her calculated basal metabolic rate is 9147 thus her basal metabolic rate is better than expected.       OBESITY BEHAVIORAL INTERVENTION VISIT  Today's visit was # 1   Starting weight: 204 lbs Starting date: 05/15/2018 Today's weight : 204 lbs Today's date: 05/15/2018 Total lbs lost to date: 0   ASK: We discussed the diagnosis of obesity with [name] today and Hailey Holden agreed to give [name] permission to discuss obesity behavioral modification therapy today.  ASSESS: Allura has the diagnosis of obesity and her BMI today is 59 Nayvie is in the action  stage of change   ADVISE: Audri was educated on the multiple health risks of obesity as well as the benefit of weight loss to improve her health. She was advised of the need for long term treatment and the importance of lifestyle modifications to improve her current health and to decrease her risk of future health problems.  AGREE: Multiple dietary modification options and treatment options were discussed and  [place] agreed to follow the recommendations documented in the above note.  ARRANGE: Estell was educated on the importance of frequent visits to treat obesity as outlined per CMS and USPSTF guidelines and agreed to schedule her next follow up appointment today.  Cristi Loron, am acting as Energy manager for [employer]. Manson Passey, DO  I have reviewed the above documentation for accuracy and completeness, and I agree with the above. -Corinna Capra, DO

## 2018-05-17 DIAGNOSIS — E559 Vitamin D deficiency, unspecified: Secondary | ICD-10-CM | POA: Insufficient documentation

## 2018-05-22 ENCOUNTER — Other Ambulatory Visit: Payer: Self-pay | Admitting: Adult Health

## 2018-05-22 MED ORDER — LEVOTHYROXINE SODIUM 25 MCG PO TABS: 25.0000 ug | ORAL_TABLET | Freq: Every day | ORAL | 3 refills | Status: DC

## 2018-05-22 MED ORDER — HYDROCHLOROTHIAZIDE 12.5 MG PO CAPS: 12.5000 mg | ORAL_CAPSULE | Freq: Every day | ORAL | 4 refills | Status: DC

## 2018-05-22 MED FILL — HYDROCHLOROTHIAZIDE 12.5 MG: 12.5 | 90 days supply | Qty: 90 | Fill #0

## 2018-05-22 MED FILL — LEVOTHYROXINE 25 MCG TABLET: 25 | 90 days supply | Qty: 90 | Fill #0

## 2018-05-22 NOTE — Progress Notes (Signed)
Refilled microzide and levothyroxine

## 2018-05-23 ENCOUNTER — Telehealth: Payer: Self-pay | Admitting: Adult Health

## 2018-05-23 MED ORDER — AZITHROMYCIN 250 MG PO TABS: ORAL_TABLET | ORAL | 0 refills | Status: DC

## 2018-05-23 MED ORDER — PREDNISONE 10 MG (21) PO TBPK: ORAL_TABLET | ORAL | 0 refills | Status: DC

## 2018-05-23 NOTE — Telephone Encounter (Signed)
Hailey Holden has cough for 2 days, greenish brown and low grade fever 99.2, will  Rx Z pack and prednisone Dose pak.

## 2018-05-29 ENCOUNTER — Encounter (INDEPENDENT_AMBULATORY_CARE_PROVIDER_SITE_OTHER): Payer: Self-pay | Admitting: Bariatrics

## 2018-05-29 ENCOUNTER — Ambulatory Visit (INDEPENDENT_AMBULATORY_CARE_PROVIDER_SITE_OTHER): Payer: No Typology Code available for payment source | Admitting: Bariatrics

## 2018-05-29 VITALS — BP 137/84 | HR 128 | Temp 97.9°F | Ht 64.0 in | Wt 203.0 lb

## 2018-05-29 DIAGNOSIS — Z9189 Other specified personal risk factors, not elsewhere classified: Secondary | ICD-10-CM | POA: Diagnosis not present

## 2018-05-29 DIAGNOSIS — F3289 Other specified depressive episodes: Secondary | ICD-10-CM

## 2018-05-29 DIAGNOSIS — Z6835 Body mass index (BMI) 35.0-35.9, adult: Secondary | ICD-10-CM

## 2018-05-29 DIAGNOSIS — E559 Vitamin D deficiency, unspecified: Secondary | ICD-10-CM

## 2018-05-29 DIAGNOSIS — I10 Essential (primary) hypertension: Secondary | ICD-10-CM | POA: Diagnosis not present

## 2018-05-29 DIAGNOSIS — E6609 Other obesity due to excess calories: Secondary | ICD-10-CM

## 2018-05-29 DIAGNOSIS — E8881 Metabolic syndrome: Secondary | ICD-10-CM | POA: Diagnosis not present

## 2018-05-29 DIAGNOSIS — K5909 Other constipation: Secondary | ICD-10-CM | POA: Diagnosis not present

## 2018-05-29 DIAGNOSIS — Z6834 Body mass index (BMI) 34.0-34.9, adult: Secondary | ICD-10-CM

## 2018-05-29 MED ORDER — BUPROPION HCL ER (SR) 150 MG PO TB12: 150.0000 mg | ORAL_TABLET | Freq: Every day | ORAL | 0 refills | Status: DC

## 2018-05-29 NOTE — Progress Notes (Signed)
Office: [phone]  /  Fax: [phone]   HPI:   Chief Complaint: OBESITY Hailey Holden is here to discuss her progress with her obesity treatment plan. She is on the Category 3 plan and is following her eating plan approximately 30 % of the time. She states she is exercising 0 minutes 0 times per week. [name] has been sick and she had to take Prednisone. [name] does better with breakfast and lunch. She has had some cravings at night. Her hunger is controlled. [name] is weighing her food. Her weight is 203 lb (92.1 kg) today and has had a weight loss of 1 pound over a period of 2 weeks since her last visit. She has lost 1 lb since starting treatment with [name].  Insulin Resistance [name] has a diagnosis of insulin resistance based on her elevated fasting insulin level >5. Although [name]'s blood glucose readings are still under good control, insulin resistance puts her at greater risk of metabolic syndrome and diabetes. Her last A1c was at 5.4 and last insulin level was at 13.7 She is not taking metformin currently and continues to work on diet and exercise to decrease risk of diabetes.  At risk for diabetes [name] is at higher than average risk for developing diabetes due to her obesity. She currently denies polyuria or polydipsia.  Vitamin D deficiency [name] has a diagnosis of vitamin D deficiency. She is not currently taking vit D and denies nausea, vomiting or muscle weakness.  Hypertension [name] is a 33 y.o. female with hypertension.  [name] denies chest pain or shortness of breath on exertion. She is working weight loss to help control her blood pressure with the goal of decreasing her risk of heart attack and stroke. [name] blood pressure is well controlled.  Constipation [name] notes constipation for the last few weeks, worse since attempting weight loss. She states BM are less frequent and are not hard and painful. She denies hematochezia or melena.   Depression  with emotional eating behaviors [name] is struggling with emotional eating and using food for comfort to the extent that it is negatively impacting her health. She often snacks when she is not hungry. [name] sometimes feels she is out of control and then feels guilty that she made poor food choices. She has been working on behavior modification techniques to help reduce her emotional eating and has been somewhat successful. She shows no sign of suicidal or homicidal ideations.  Depression screen [name] 2/9 05/15/2018 11/02/2017 10/27/2016 06/19/2015 05/27/2015  Decreased Interest 1 0 0 0 0  Down, Depressed, Hopeless 1 0 0 0 0  PHQ - 2 Score 2 0 0 0 0  Altered sleeping 1 - - - -  Tired, decreased energy 2 - - - -  Change in appetite 2 - - - -  Feeling bad or failure about yourself  1 - - - -  Trouble concentrating 2 - - - -  Moving slowly or fidgety/restless 0 - - - -  Suicidal thoughts 0 - - - -  PHQ-9 Score 10 - - - -  Difficult doing work/chores Not difficult at all - - - -   ASSESSMENT AND PLAN:  Insulin resistance  Vitamin D deficiency  Essential hypertension  Other constipation  Other depression - with emotional eating - Plan: buPROPion (WELLBUTRIN SR) 150 MG 12 hr tablet  At risk for diabetes mellitus  Class 2 severe obesity with serious comorbidity and body mass index (BMI) of 35.0 to 35.9  in adult, unspecified obesity type (HCC)  PLAN:  Insulin Resistance [name] will continue to work on weight loss, exercise, and decreasing simple carbohydrates in her diet to help decrease the risk of diabetes. We dicussed insulin resistance today. She was informed that eating too many simple carbohydrates or too many calories at one sitting increases the likelihood of GI side effects. [name] agreed to follow up with [name] as directed to monitor her progress.  Diabetes risk counseling [name] was given extended (15 minutes) diabetes prevention counseling today. She is 33 y.o. female and has risk  factors for diabetes including obesity and insulin resistance. We discussed intensive lifestyle modifications today with an emphasis on weight loss as well as increasing exercise and decreasing simple carbohydrates in her diet.  Vitamin D Deficiency [name] was informed that low vitamin D levels contributes to fatigue and are associated with obesity, breast, and colon cancer. She will continue OTC Vit D @ 2,000 IU daily and will follow up for routine testing of vitamin D, at least 2-3 times per year. She was informed of the risk of over-replacement of vitamin D and agrees to not increase her dose unless she discusses this with [name] first.  Hypertension We discussed sodium restriction, working on healthy weight loss, and a regular exercise program as the means to achieve improved blood pressure control. [name] agreed with this plan and agreed to follow up as directed. We will continue to monitor her blood pressure as well as her progress with the above lifestyle modifications. She will continue her medications as prescribed and will watch for signs of hypotension as she continues her lifestyle modifications.  Constipation [name] was informed decrease bowel movement frequency is normal while losing weight, but stools should not be hard or painful. She was advised to increase her H20 intake and work on increasing her fiber intake. High fiber foods were discussed today. [name] will begin Miralax daily. She will take stool softener or Benefiber with water. [name] agrees to follow up as directed.  Depression with Emotional Eating Behaviors We discussed behavior modification techniques today to help [name] deal with her emotional eating and depression. She has agreed to take Wellbutrin SR 150 mg qd #30 with no refills and follow up as directed.  Obesity [name] is currently in the action stage of change. As such, her goal is to continue with weight loss efforts She has agreed to follow the Category 3  plan [name] has been instructed to work up to a goal of 150 minutes of combined cardio and strengthening exercise per week for weight loss and overall health benefits. We discussed the following Behavioral Modification Strategies today: increase H2O intake, no skipping meals, keeping healthy foods in the home, increasing lean protein intake, decreasing simple carbohydrates, increasing vegetables, decrease eating out and work on meal planning and easy cooking plans  Alilyana has agreed to follow up with our clinic in 2 weeks. She was informed of the importance of frequent follow up visits to maximize her success with intensive lifestyle modifications for her multiple health conditions.  ALLERGIES: Allergies  Allergen Reactions  . Clindamycin/Lincomycin Shortness Of Breath and Rash  . Tdap [Tetanus-Diphth-Acell Pertussis] Itching    Itching, swelling in the arm, chest tightness, wheezing, headache, SOB  . Latex   . Penicillins     MEDICATIONS: Current Outpatient Medications on File Prior to Visit  Medication Sig Dispense Refill  . acetaminophen (TYLENOL) 500 MG tablet Take 500 mg by mouth every 6 (six) hours as needed.    Marland Kitchen  albuterol (VENTOLIN HFA) 108 (90 Base) MCG/ACT inhaler INHALE 2 PUFFS BY MOUTH EVERY 4 HOURS AS NEEDED FOR WHEEZING 18 g 6  . amLODipine (NORVASC) 10 MG tablet TAKE 1 TABLET (10 MG TOTAL) BY MOUTH DAILY. 90 tablet 3  . cetirizine (ZYRTEC) 10 MG tablet Take 10 mg by mouth daily.    . cholecalciferol (VITAMIN D) 1000 units tablet Take 5,000 Units by mouth daily.     . DULoxetine (CYMBALTA) 60 MG capsule TAKE 1 CAPSULE BY MOUTH DAILY. 90 capsule 0  . etodolac (LODINE) 400 MG tablet Take 1 tablet (400 mg total) by mouth 2 (two) times daily. 28 tablet 2  . hydrochlorothiazide (MICROZIDE) 12.5 MG capsule Take 1 capsule (12.5 mg total) by mouth daily. 90 capsule 4  . ibuprofen (ADVIL,MOTRIN) 200 MG tablet Take 200 mg by mouth every 6 (six) hours as needed.    Marland Kitchen levothyroxine  (SYNTHROID, LEVOTHROID) 25 MCG tablet Take 1 tablet (25 mcg total) by mouth daily before breakfast. 90 tablet 3  . Naproxen Sodium (ALEVE PO) Take by mouth as needed.    Marland Kitchen tiZANidine (ZANAFLEX) 4 MG tablet One p o up to tid prn pain 30 tablet 1  . valACYclovir (VALTREX) 1000 MG tablet Take 1,000 mg by mouth once as needed.    . vitamin B-12 (CYANOCOBALAMIN) 500 MCG tablet Take 500 mcg by mouth daily.     No current facility-administered medications on file prior to visit.     PAST MEDICAL HISTORY: Past Medical History:  Diagnosis Date  . Allergies   . Asthma   . Back pain   . Breast nodule 02/04/2014  . Constipation   . Constipation   . Depression   . Dizziness   . Dry skin   . Easy bruising   . Encounter for other contraceptive management 10/21/2014  . Enlarged thyroid 09/27/2012  . Fatigue   . Fatty liver   . Fibromyalgia   . Floaters in visual field   . Glands swollen   . Hashimoto's disease   . Headache   . Heat intolerance   . Hidradenitis   . High cholesterol   . Hoarseness   . Hypertension   . Hypothyroidism   . IBS (irritable bowel syndrome)   . Joint pain   . Leg cramping   . Migraine headache   . Muscle stiffness   . Myalgia   . Nausea   . PMS (premenstrual syndrome) 09/27/2012  . Stress   . Swelling of lower extremity   . Tachycardia    patient states she has long history of resting tachycardia  . Thyroid nodule   . Uterine prolapse 10/21/2014  . Vaginal irritation 10/08/2015  . Vision changes   . Wheezing   . Yeast infection 10/08/2015    PAST SURGICAL HISTORY: History reviewed. No pertinent surgical history.  SOCIAL HISTORY: Social History   Tobacco Use  . Smoking status: Never Smoker  . Smokeless tobacco: Never Used  Substance Use Topics  . Alcohol use: No  . Drug use: No    FAMILY HISTORY: Family History  Problem Relation Age of Onset  . Diabetes Mother   . Hypertension Mother   . Fibromyalgia Mother   . Obesity Mother   . Heart  disease Father   . Hypertension Father   . Hyperlipidemia Father   . Obesity Father   . Sleep apnea Father   . Hypertension Maternal Grandmother   . Vision loss Maternal Grandmother   . Glaucoma Maternal  Grandmother   . Stroke Maternal Grandmother   . Cancer Maternal Grandfather        melonoma  . Heart disease Paternal Grandmother   . Hypertension Paternal Grandmother   . Hyperlipidemia Paternal Grandmother   . Other Paternal Grandmother        macular degeneration  . Cancer Paternal Grandfather        lung  . Cancer Maternal Uncle        lung  . Colon cancer Maternal Uncle   . Cancer Paternal Aunt        breast    ROS: Review of Systems  Constitutional: Positive for weight loss.  Gastrointestinal: Positive for constipation. Negative for melena, nausea and vomiting.       Negative for hematochezia  Genitourinary: Negative for frequency.  Musculoskeletal:       Negative for muscle weakness  Endo/Heme/Allergies: Negative for polydipsia.  Psychiatric/Behavioral: Positive for depression. Negative for suicidal ideas.    PHYSICAL EXAM: Blood pressure 137/84, pulse (!) 128, temperature 97.9 F (36.6 C), temperature source Oral, height  (1.626 m), weight 203 lb (92.1 kg), last menstrual period 05/04/2018, SpO2 97 %. Body mass index is 34.84 kg/m. Physical Exam Vitals signs reviewed.  Constitutional:      Appearance: Normal appearance. She is well-developed. She is obese.  Cardiovascular:     Rate and Rhythm: Normal rate.  Pulmonary:     Effort: Pulmonary effort is normal.  Musculoskeletal: Normal range of motion.  Skin:    General: Skin is warm and dry.  Neurological:     Mental Status: She is alert and oriented to person, place, and time.  Psychiatric:        Mood and Affect: Mood normal.        Behavior: Behavior normal.        Thought Content: Thought content does not include homicidal or suicidal ideation.     RECENT LABS AND TESTS: BMET    Component  Value Date/Time   NA 140 05/15/2018 1203   K 3.5 05/15/2018 1203   CL 99 05/15/2018 1203   CO2 23 05/15/2018 1203   GLUCOSE 83 05/15/2018 1203   GLUCOSE 81 10/17/2013 0901   BUN 7 05/15/2018 1203   CREATININE 0.61 05/15/2018 1203   CREATININE 0.55 10/17/2013 0901   CALCIUM 9.3 05/15/2018 1203   GFRNONAA 119 05/15/2018 1203   GFRAA 138 05/15/2018 1203   Lab Results  Component Value Date   HGBA1C 5.4 05/15/2018   HGBA1C 5.2 04/24/2015   HGBA1C 5.4 10/02/2012   Lab Results  Component Value Date   INSULIN 13.7 05/15/2018   CBC    Component Value Date/Time   WBC 12.3 (H) 10/27/2016 1653   WBC 6.7 10/17/2013 0901   RBC 4.69 10/27/2016 1653   RBC 4.67 10/17/2013 0901   HGB 13.3 10/27/2016 1653   HCT 40.5 10/27/2016 1653   PLT 434 (H) 10/27/2016 1653   MCV 86 10/27/2016 1653   MCH 28.4 10/27/2016 1653   MCH 29.1 10/17/2013 0901   MCHC 32.8 10/27/2016 1653   MCHC 35.1 10/17/2013 0901   RDW 13.1 10/27/2016 1653   Iron/TIBC/Ferritin/ %Sat No results found for: IRON, TIBC, FERRITIN, IRONPCTSAT Lipid Panel     Component Value Date/Time   CHOL 252 (H) 02/16/2018 0812   TRIG 100 02/16/2018 0812   HDL 43 02/16/2018 0812   CHOLHDL 5.9 (H) 02/16/2018 0812   CHOLHDL 4.4 10/17/2013 0901   VLDL 16 10/17/2013 0901  LDLCALC 189 (H) 02/16/2018 0812   Hepatic Function Panel     Component Value Date/Time   PROT 7.1 05/15/2018 1203   ALBUMIN 4.5 05/15/2018 1203   AST 24 05/15/2018 1203   ALT 28 05/15/2018 1203   ALKPHOS 140 (H) 05/15/2018 1203   BILITOT 0.7 05/15/2018 1203   BILIDIR 0.14 02/16/2018 0812      Component Value Date/Time   TSH 2.110 05/15/2018 1203   TSH 2.690 02/16/2018 0953   TSH 1.780 09/27/2017 0912   Results for KYNSLEI, ART (MRN [id number]) as of 05/29/2018 20:44  Ref. Range 05/15/2018 12:03  Vitamin D, 25-Hydroxy Latest Ref Range: 30.0 - 100.0 ng/mL 52.5     OBESITY BEHAVIORAL INTERVENTION VISIT  Today's visit was # 2   Starting weight: 204  lbs Starting date: 05/15/2018 Today's weight : 203 lbs Today's date: 05/29/2018 Total lbs lost to date: 1    05/29/2018  Height  (1.626 m)  Weight 203 lb (92.1 kg)  BMI (Calculated) 34.83  BLOOD PRESSURE - SYSTOLIC 137  BLOOD PRESSURE - DIASTOLIC 84   Body Fat % 41.1 %  Total Body Water (lbs) 82.5 lbs    ASK: We discussed the diagnosis of obesity with [name] today and [name] agreed to give [name] permission to discuss obesity behavioral modification therapy today.  ASSESS: Brianni has the diagnosis of obesity and her BMI today is 34.83 Parrish is in the action stage of change   ADVISE: Carianna was educated on the multiple health risks of obesity as well as the benefit of weight loss to improve her health. She was advised of the need for long term treatment and the importance of lifestyle modifications to improve her current health and to decrease her risk of future health problems.  AGREE: Multiple dietary modification options and treatment options were discussed and  Simrit agreed to follow the recommendations documented in the above note.  ARRANGE: Jeffrie was educated on the importance of frequent visits to treat obesity as outlined per CMS and USPSTF guidelines and agreed to schedule her next follow up appointment today.  Cristi Loron, am acting as Energy manager for [employer]. Manson Passey, DO  I have reviewed the above documentation for accuracy and completeness, and I agree with the above. -Corinna Capra, DO

## 2018-05-30 DIAGNOSIS — E8881 Metabolic syndrome: Secondary | ICD-10-CM | POA: Insufficient documentation

## 2018-06-14 ENCOUNTER — Other Ambulatory Visit: Payer: Self-pay

## 2018-06-14 ENCOUNTER — Ambulatory Visit (INDEPENDENT_AMBULATORY_CARE_PROVIDER_SITE_OTHER): Payer: No Typology Code available for payment source | Admitting: Bariatrics

## 2018-06-14 ENCOUNTER — Encounter (INDEPENDENT_AMBULATORY_CARE_PROVIDER_SITE_OTHER): Payer: Self-pay | Admitting: Bariatrics

## 2018-06-14 VITALS — BP 119/72 | HR 109 | Temp 97.9°F | Ht 64.0 in | Wt 205.0 lb

## 2018-06-14 DIAGNOSIS — Z6835 Body mass index (BMI) 35.0-35.9, adult: Secondary | ICD-10-CM | POA: Diagnosis not present

## 2018-06-14 DIAGNOSIS — F3289 Other specified depressive episodes: Secondary | ICD-10-CM | POA: Diagnosis not present

## 2018-06-14 DIAGNOSIS — E8881 Metabolic syndrome: Secondary | ICD-10-CM

## 2018-06-15 ENCOUNTER — Ambulatory Visit: Payer: No Typology Code available for payment source | Admitting: Family Medicine

## 2018-06-18 NOTE — Progress Notes (Signed)
Office: [phone]  /  Fax: [phone]   HPI:   Chief Complaint: OBESITY Hailey Holden is here to discuss her progress with her obesity treatment plan. She is on the Category 3 plan and is following her eating plan approximately 50 % of the time. She states she is exercising 0 minutes 0 times per week. Hailey Holden only started her Wellbutrin about one week ago and she thinks that is helping with snacking. Hailey Holden is drinking water. Her weight is 205 lb (93 kg) today and has had a weight gain of 2 pounds over a period of 2 weeks since her last visit. She has gained 1 lb since starting treatment with [name].  Insulin Resistance [name] has a diagnosis of insulin resistance based on her elevated fasting insulin level >5. Although Hailey Holden's blood glucose readings are still under good control, insulin resistance puts her at greater risk of metabolic syndrome and diabetes. Hailey Holden has a normal appetite. Her last A1c was at 5.4 and last insulin level was at 13.7 She is not taking metformin currently and continues to work on diet and exercise to decrease risk of diabetes.  Depression with emotional eating behaviors Hailey Holden is struggling with emotional eating and using food for comfort to the extent that it is negatively impacting her health. She often snacks when she is not hungry. Hailey Holden sometimes feels she is out of control and then feels guilty that she made poor food choices. Hailey Holden is taking Wellbutrin and she denies any side effects or snacking. She has been working on behavior modification techniques to help reduce her emotional eating and has been somewhat successful. She shows no sign of suicidal or homicidal ideations.  Depression screen [facility] 2/9 05/15/2018 11/02/2017 10/27/2016 06/19/2015 05/27/2015  Decreased Interest 1 0 0 0 0  Down, Depressed, Hopeless 1 0 0 0 0  PHQ - 2 Score 2 0 0 0 0  Altered sleeping 1 - - - -  Tired, decreased energy 2 - - - -  Change in appetite 2 - - - -  Feeling bad or failure about  yourself  1 - - - -  Trouble concentrating 2 - - - -  Moving slowly or fidgety/restless 0 - - - -  Suicidal thoughts 0 - - - -  PHQ-9 Score 10 - - - -  Difficult doing work/chores Not difficult at all - - - -    ASSESSMENT AND PLAN:  Insulin resistance  Other depression - with emotional eating  Class 2 severe obesity with serious comorbidity and body mass index (BMI) of 35.0 to 35.9 in adult, unspecified obesity type (HCC)  PLAN:  Insulin Resistance Iyania will continue to work on weight loss, exercise, increasing lean protein and decreasing simple carbohydrates in her diet to help decrease the risk of diabetes. She was informed that eating too many simple carbohydrates or too many calories at one sitting increases the likelihood of GI side effects. Hailey Holden agreed to follow up with [name] as directed to monitor her progress.  Depression with Emotional Eating Behaviors We discussed behavior modification techniques today to help Hailey Holden deal with her emotional eating and depression. She will continue Wellbutrin SR 150 mg qd and follow up as directed.  I spent > than 50% of the 15 minute visit on counseling as documented in the note.  Obesity Hailey Holden is currently in the action stage of change. As such, her goal is to continue with weight loss efforts She has agreed to follow the Category 3 plan Hailey Holden  has been instructed to work up to a goal of 150 minutes of combined cardio and strengthening exercise per week for weight loss and overall health benefits. We discussed the following Behavioral Modification Strategies today: increase H2O intake, no skipping meals, keeping healthy foods in the home, increasing lean protein intake, decreasing simple carbohydrates, increasing vegetables, decrease eating out and work on meal planning and preparation Hailey Holden will continue to weight her meat. Handouts for store bought seasonings and homemade seasonings were provided to patient today.  Hailey Holden has agreed  to follow up with our clinic in 2 weeks. She was informed of the importance of frequent follow up visits to maximize her success with intensive lifestyle modifications for her multiple health conditions.  ALLERGIES: Allergies  Allergen Reactions  . Clindamycin/Lincomycin Shortness Of Breath and Rash  . Tdap [Tetanus-Diphth-Acell Pertussis] Itching    Itching, swelling in the arm, chest tightness, wheezing, headache, SOB  . Latex   . Penicillins     MEDICATIONS: Current Outpatient Medications on File Prior to Visit  Medication Sig Dispense Refill  . acetaminophen (TYLENOL) 500 MG tablet Take 500 mg by mouth every 6 (six) hours as needed.    Marland Kitchen albuterol (VENTOLIN HFA) 108 (90 Base) MCG/ACT inhaler INHALE 2 PUFFS BY MOUTH EVERY 4 HOURS AS NEEDED FOR WHEEZING 18 g 6  . amLODipine (NORVASC) 10 MG tablet TAKE 1 TABLET (10 MG TOTAL) BY MOUTH DAILY. 90 tablet 3  . buPROPion (WELLBUTRIN SR) 150 MG 12 hr tablet Take 1 tablet (150 mg total) by mouth daily. 30 tablet 0  . cetirizine (ZYRTEC) 10 MG tablet Take 10 mg by mouth daily.    . cholecalciferol (VITAMIN D) 1000 units tablet Take 5,000 Units by mouth daily.     . DULoxetine (CYMBALTA) 60 MG capsule TAKE 1 CAPSULE BY MOUTH DAILY. 90 capsule 0  . etodolac (LODINE) 400 MG tablet Take 1 tablet (400 mg total) by mouth 2 (two) times daily. 28 tablet 2  . hydrochlorothiazide (MICROZIDE) 12.5 MG capsule Take 1 capsule (12.5 mg total) by mouth daily. 90 capsule 4  . ibuprofen (ADVIL,MOTRIN) 200 MG tablet Take 200 mg by mouth every 6 (six) hours as needed.    Marland Kitchen levothyroxine (SYNTHROID, LEVOTHROID) 25 MCG tablet Take 1 tablet (25 mcg total) by mouth daily before breakfast. 90 tablet 3  . Naproxen Sodium (ALEVE PO) Take by mouth as needed.    Marland Kitchen tiZANidine (ZANAFLEX) 4 MG tablet One p o up to tid prn pain 30 tablet 1  . valACYclovir (VALTREX) 1000 MG tablet Take 1,000 mg by mouth once as needed.    . vitamin B-12 (CYANOCOBALAMIN) 500 MCG tablet Take 500  mcg by mouth daily.     No current facility-administered medications on file prior to visit.     PAST MEDICAL HISTORY: Past Medical History:  Diagnosis Date  . Allergies   . Asthma   . Back pain   . Breast nodule 02/04/2014  . Constipation   . Constipation   . Depression   . Dizziness   . Dry skin   . Easy bruising   . Encounter for other contraceptive management 10/21/2014  . Enlarged thyroid 09/27/2012  . Fatigue   . Fatty liver   . Fibromyalgia   . Floaters in visual field   . Glands swollen   . Hashimoto's disease   . Headache   . Heat intolerance   . Hidradenitis   . High cholesterol   . Hoarseness   . Hypertension   .  Hypothyroidism   . IBS (irritable bowel syndrome)   . Joint pain   . Leg cramping   . Migraine headache   . Muscle stiffness   . Myalgia   . Nausea   . PMS (premenstrual syndrome) 09/27/2012  . Stress   . Swelling of lower extremity   . Tachycardia    patient states she has long history of resting tachycardia  . Thyroid nodule   . Uterine prolapse 10/21/2014  . Vaginal irritation 10/08/2015  . Vision changes   . Wheezing   . Yeast infection 10/08/2015    PAST SURGICAL HISTORY: History reviewed. No pertinent surgical history.  SOCIAL HISTORY: Social History   Tobacco Use  . Smoking status: Never Smoker  . Smokeless tobacco: Never Used  Substance Use Topics  . Alcohol use: No  . Drug use: No    FAMILY HISTORY: Family History  Problem Relation Age of Onset  . Diabetes Mother   . Hypertension Mother   . Fibromyalgia Mother   . Obesity Mother   . Heart disease Father   . Hypertension Father   . Hyperlipidemia Father   . Obesity Father   . Sleep apnea Father   . Hypertension Maternal Grandmother   . Vision loss Maternal Grandmother   . Glaucoma Maternal Grandmother   . Stroke Maternal Grandmother   . Cancer Maternal Grandfather        melonoma  . Heart disease Paternal Grandmother   . Hypertension Paternal Grandmother   .  Hyperlipidemia Paternal Grandmother   . Other Paternal Grandmother        macular degeneration  . Cancer Paternal Grandfather        lung  . Cancer Maternal Uncle        lung  . Colon cancer Maternal Uncle   . Cancer Paternal Aunt        breast    ROS: Review of Systems  Constitutional: Negative for weight loss.  Endo/Heme/Allergies:       Negative for polyphagia  Psychiatric/Behavioral: Positive for depression. Negative for suicidal ideas.    PHYSICAL EXAM: Blood pressure 119/72, pulse (!) 109, temperature 97.9 F (36.6 C), temperature source Oral, height 5\' 4"  (1.626 m), weight 205 lb (93 kg), SpO2 96 %. Body mass index is 35.19 kg/m. Physical Exam Vitals signs reviewed.  Constitutional:      Appearance: Normal appearance. She is well-developed. She is obese.  Cardiovascular:     Rate and Rhythm: Normal rate.  Pulmonary:     Effort: Pulmonary effort is normal.  Musculoskeletal: Normal range of motion.  Skin:    General: Skin is warm and dry.  Neurological:     Mental Status: She is alert and oriented to person, place, and time.  Psychiatric:        Mood and Affect: Mood normal.        Behavior: Behavior normal.     RECENT LABS AND TESTS: BMET    Component Value Date/Time   NA 140 05/15/2018 1203   K 3.5 05/15/2018 1203   CL 99 05/15/2018 1203   CO2 23 05/15/2018 1203   GLUCOSE 83 05/15/2018 1203   GLUCOSE 81 10/17/2013 0901   BUN 7 05/15/2018 1203   CREATININE 0.61 05/15/2018 1203   CREATININE 0.55 10/17/2013 0901   CALCIUM 9.3 05/15/2018 1203   GFRNONAA 119 05/15/2018 1203   GFRAA 138 05/15/2018 1203   Lab Results  Component Value Date   HGBA1C 5.4 05/15/2018   HGBA1C 5.2  04/24/2015   HGBA1C 5.4 10/02/2012   Lab Results  Component Value Date   INSULIN 13.7 05/15/2018   CBC    Component Value Date/Time   WBC 12.3 (H) 10/27/2016 1653   WBC 6.7 10/17/2013 0901   RBC 4.69 10/27/2016 1653   RBC 4.67 10/17/2013 0901   HGB 13.3 10/27/2016 1653    HCT 40.5 10/27/2016 1653   PLT 434 (H) 10/27/2016 1653   MCV 86 10/27/2016 1653   MCH 28.4 10/27/2016 1653   MCH 29.1 10/17/2013 0901   MCHC 32.8 10/27/2016 1653   MCHC 35.1 10/17/2013 0901   RDW 13.1 10/27/2016 1653   Iron/TIBC/Ferritin/ %Sat No results found for: IRON, TIBC, FERRITIN, IRONPCTSAT Lipid Panel     Component Value Date/Time   CHOL 252 (H) 02/16/2018 0812   TRIG 100 02/16/2018 0812   HDL 43 02/16/2018 0812   CHOLHDL 5.9 (H) 02/16/2018 0812   CHOLHDL 4.4 10/17/2013 0901   VLDL 16 10/17/2013 0901   LDLCALC 189 (H) 02/16/2018 0812   Hepatic Function Panel     Component Value Date/Time   PROT 7.1 05/15/2018 1203   ALBUMIN 4.5 05/15/2018 1203   AST 24 05/15/2018 1203   ALT 28 05/15/2018 1203   ALKPHOS 140 (H) 05/15/2018 1203   BILITOT 0.7 05/15/2018 1203   BILIDIR 0.14 02/16/2018 0812      Component Value Date/Time   TSH 2.110 05/15/2018 1203   TSH 2.690 02/16/2018 0953   TSH 1.780 09/27/2017 0912     Ref. Range 05/15/2018 12:03  Vitamin D, 25-Hydroxy Latest Ref Range: 30.0 - 100.0 ng/mL 52.5     OBESITY BEHAVIORAL INTERVENTION VISIT  Today's visit was # 3   Starting weight: 204 lbs Starting date: 05/15/2018 Today's weight : 205 lbs Today's date: 06/14/2018 Total lbs lost to date: 0    06/14/2018  Height 5\' 4"  (1.626 m)  Weight 205 lb (93 kg)  BMI (Calculated) 35.17  BLOOD PRESSURE - SYSTOLIC 119  BLOOD PRESSURE - DIASTOLIC 72   Body Fat % 43.8 %  Total Body Water (lbs) 90.8 lbs    ASK: We discussed the diagnosis of obesity with [name] today and Hailey Holden agreed to give [name] permission to discuss obesity behavioral modification therapy today.  ASSESS: Runell has the diagnosis of obesity and her BMI today is 35.17 Deserae is in the action stage of change   ADVISE: Hannahmarie was educated on the multiple health risks of obesity as well as the benefit of weight loss to improve her health. She was advised of the need for long term treatment  and the importance of lifestyle modifications to improve her current health and to decrease her risk of future health problems.  AGREE: Multiple dietary modification options and treatment options were discussed and  Andrea agreed to follow the recommendations documented in the above note.  ARRANGE: Jashae was educated on the importance of frequent visits to treat obesity as outlined per CMS and USPSTF guidelines and agreed to schedule her next follow up appointment today.  Cristi Loron, am acting as Energy manager for [employer]. Manson Passey, DO  I have reviewed the above documentation for accuracy and completeness, and I agree with the above. -Corinna Capra, DO

## 2018-06-27 ENCOUNTER — Encounter (INDEPENDENT_AMBULATORY_CARE_PROVIDER_SITE_OTHER): Payer: Self-pay

## 2018-07-03 ENCOUNTER — Other Ambulatory Visit (INDEPENDENT_AMBULATORY_CARE_PROVIDER_SITE_OTHER): Payer: Self-pay | Admitting: Bariatrics

## 2018-07-03 ENCOUNTER — Ambulatory Visit (INDEPENDENT_AMBULATORY_CARE_PROVIDER_SITE_OTHER): Payer: No Typology Code available for payment source | Admitting: Bariatrics

## 2018-07-03 ENCOUNTER — Encounter (INDEPENDENT_AMBULATORY_CARE_PROVIDER_SITE_OTHER): Payer: Self-pay

## 2018-07-03 DIAGNOSIS — F3289 Other specified depressive episodes: Secondary | ICD-10-CM

## 2018-07-05 MED FILL — ALBUTEROL SULFATE HFA 108 (: 108 (90 BAS | 17 days supply | Qty: 9 | Fill #0

## 2018-07-10 ENCOUNTER — Other Ambulatory Visit: Payer: Self-pay | Admitting: Family Medicine

## 2018-07-10 MED FILL — AMLODIPINE BESYLATE 10 MG T: 10 | 90 days supply | Qty: 90 | Fill #0

## 2018-07-11 MED FILL — DULOXETINE HCL 60 MG CPEP: 60 | 90 days supply | Qty: 90 | Fill #0

## 2018-07-11 NOTE — Telephone Encounter (Signed)
Ok three mo 

## 2018-07-18 ENCOUNTER — Encounter: Payer: Self-pay | Admitting: Family Medicine

## 2018-07-18 ENCOUNTER — Other Ambulatory Visit: Payer: Self-pay

## 2018-07-18 ENCOUNTER — Ambulatory Visit (INDEPENDENT_AMBULATORY_CARE_PROVIDER_SITE_OTHER): Payer: No Typology Code available for payment source | Admitting: Family Medicine

## 2018-07-18 VITALS — Wt 204.0 lb

## 2018-07-18 DIAGNOSIS — J4541 Moderate persistent asthma with (acute) exacerbation: Secondary | ICD-10-CM | POA: Diagnosis not present

## 2018-07-18 MED ORDER — FLUTICASONE PROPIONATE HFA 44 MCG/ACT IN AERO: INHALATION_SPRAY | RESPIRATORY_TRACT | 5 refills | Status: DC

## 2018-07-18 MED ORDER — MONTELUKAST SODIUM 10 MG PO TABS: 10.0000 mg | ORAL_TABLET | Freq: Every day | ORAL | 1 refills | Status: DC

## 2018-07-18 MED FILL — FLOVENT HFA 44 MCG INHALER: 44 | 30 days supply | Qty: 11 | Fill #0

## 2018-07-18 MED FILL — MONTELUKAST SOD 10 MG TAB: 10 | 90 days supply | Qty: 90 | Fill #0

## 2018-07-18 NOTE — Progress Notes (Signed)
   Subjective:    Patient ID: Hailey Holden, female    DOB: 13-Dec-1985, 33 y.o.   MRN: [id number] Audio and video HPI Pt is having problems with pollen. Pt does have asthma. Pt states that she has been using inhaler more than normal. Pt states Eber Jones had placed her on Singulair . Sometimes is having shortness of breath at night. If patient is too active or doing to much pt can see she gets short of breath. Pt states she feels a little bit tighter than normal. Has not been around anyone sick. Pt states she noticed all this when pollen started. No fever, no runny nose, watery eyes and has been sneezing.   Virtual Visit via Video Note  I connected with Hailey Holden on 07/18/18 at  2:00 PM EDT by a video enabled telemedicine application and verified that I am speaking with the correct person using two identifiers.   I discussed the limitations of evaluation and management by telemedicine and the availability of in person appointments. The patient expressed understanding and agreed to proceed.  History of Present Illness:    Observations/Objective:   Assessment and Plan:   Follow Up Instructions:    I discussed the assessment and treatment plan with the patient. The patient was provided an opportunity to ask questions and all were answered. The patient agreed with the plan and demonstrated an understanding of the instructions.   The patient was advised to call back or seek an in-person evaluation if the symptoms worsen or if the condition fails to improve as anticipated.  I provided  minutes of non-face-to-face time during this encounter.   Marlowe [place], LPN    Review of Systems Greater than 50% of this 25 minute face to face visit was spent in counseling and discussion and coordination of care regarding the above diagnosis/diagnosies     Objective:   Physical Exam   Virtual visit     Assessment & Plan:  Impression flare of allergic rhinitis and flare of asthma  requiring daily sometimes multiple times per day inhalers.  Options discussed.  Will add Singulair plus Flovent starting dose rationale discussed

## 2018-07-25 ENCOUNTER — Encounter (INDEPENDENT_AMBULATORY_CARE_PROVIDER_SITE_OTHER): Payer: Self-pay | Admitting: Bariatrics

## 2018-07-25 ENCOUNTER — Ambulatory Visit (INDEPENDENT_AMBULATORY_CARE_PROVIDER_SITE_OTHER): Payer: No Typology Code available for payment source | Admitting: Bariatrics

## 2018-07-25 ENCOUNTER — Other Ambulatory Visit: Payer: Self-pay

## 2018-07-25 DIAGNOSIS — Z6835 Body mass index (BMI) 35.0-35.9, adult: Secondary | ICD-10-CM | POA: Diagnosis not present

## 2018-07-25 DIAGNOSIS — F3289 Other specified depressive episodes: Secondary | ICD-10-CM

## 2018-07-25 DIAGNOSIS — E8881 Metabolic syndrome: Secondary | ICD-10-CM | POA: Diagnosis not present

## 2018-07-25 MED ORDER — BUPROPION HCL ER (SR) 150 MG PO TB12: 150.0000 mg | ORAL_TABLET | Freq: Every day | ORAL | 0 refills | Status: DC

## 2018-07-26 NOTE — Progress Notes (Signed)
Office: [phone]  /  Fax: [phone] TeleHealth Visit:  Hailey Holden has verbally consented to this TeleHealth visit today. The patient is located at work, the provider is located at the [facility] office. The participants in this visit include the listed provider and patient and any and all parties involved. The visit was conducted today via FaceTime.  HPI:   Chief Complaint: OBESITY Hailey Sheldonshley is here to discuss her progress with her obesity treatment plan. She is on the Category 3 plan and is following her eating plan approximately 70 % of the time. She states she is walking for 30 minutes 2 times per week. Hailey Sheldonshley thinks that she has maintained her weight. Hailey Sheldonshley is doing well overall. She is not doing any stress eating. We were unable to weigh the patient today for this TeleHealth visit. She feels as if she has maintained weight since her last visit. She has lost 0 lbs since starting treatment with us.  Insulin Resistance Hailey Sheldonshley has a diagnosis of insulin resistance based on her elevated fasting insulin level >5. Although Hailey Holden's blood glucose readings are still under good control, insulin resistance puts her at greater risk of metabolic syndrome and diabetes. Her last A1c was at 5.4 and last insulin level was at 13.7 She is not on medications. Hailey Sheldonshley continues to work on diet and exercise to decrease risk of diabetes.  Depression with emotional eating behaviors Hailey Sheldonshley is currently taking Wellbutrin. Wellbutrin helps with cravings. Hailey Sheldonshley struggles with emotional eating and using food for comfort to the extent that it is negatively impacting her health. She often snacks when she is not hungry. Hailey Sheldonshley sometimes feels she is out of control and then feels guilty that she made poor food choices. She has been working on behavior modification techniques to help reduce her emotional eating and has been somewhat successful. She shows no sign of suicidal or homicidal ideations.   [facility] 2[facility] 05/15/2018 11/02/2017 10/27/2016 06/19/2015 05/27/2015  Decreased Interest 1 0 0 0 0  Down, Depressed, Hopeless 1 0 0 0 0  PHQ - 2 Score 2 0 0 0 0  Altered sleeping 1 - - - -  Tired, decreased energy 2 - - - -  Change in appetite 2 - - - -  Feeling bad or failure about yourself  1 - - - -  Trouble concentrating 2 - - - -  Moving slowly or fidgety/restless 0 - - - -  Suicidal thoughts 0 - - - -  PHQ-9 Score 10 - - - -  Difficult doing work/chores Not difficult at all - - - -    ASSESSMENT AND PLAN:  Insulin resistance  Other depression - with emotional eating - Plan: buPROPion (WELLBUTRIN SR) 150 MG 12 hr tablet  Class 2 severe obesity with serious comorbidity and body mass index (BMI) of 35.0 to 35.9 in adult, unspecified obesity type (HCC)  PLAN:  Insulin Resistance Hailey Sheldonshley will continue to work on weight loss, exercise, increasing lean protein and decreasing simple carbohydrates in her diet to help decrease the risk of diabetes. She was informed that eating too many simple carbohydrates or too many calories at one sitting increases the likelihood of GI side effects. Hailey Sheldonshley agreed to follow up with us as directed to monitor her progress.  Depression with Emotional Eating Behaviors We discussed behavior modification techniques today to help Hailey Sheldonshley deal with her emotional eating and depression. She has agreed to continue Wellbutrin SR 150 mg daily #30 with  no refills and follow up as directed.  Obesity [name] is currently in the action stage of change. As such, her goal is to continue with weight loss efforts She has agreed to follow the Category 3 plan [name] will continue to walk for weight loss and overall health benefits. We discussed the following Behavioral Modification Strategies today: increase H2O intake, no skipping meals, keeping healthy foods in the home, increasing lean protein intake, decreasing simple carbohydrates, increasing vegetables, decrease  eating out and work on meal planning and easy cooking plans [name] will weigh herself at home before each visit  [name] has agreed to follow up with our clinic in 2 weeks. She was informed of the importance of frequent follow up visits to maximize her success with intensive lifestyle modifications for her multiple health conditions.  ALLERGIES: Allergies  Allergen Reactions  . Clindamycin/Lincomycin Shortness Of Breath and Rash  . Tdap [Tetanus-Diphth-Acell Pertussis] Itching    Itching, swelling in the arm, chest tightness, wheezing, headache, SOB  . Latex   . Penicillins     MEDICATIONS: Current Outpatient Medications on File Prior to Visit  Medication Sig Dispense Refill  . acetaminophen (TYLENOL) 500 MG tablet Take 500 mg by mouth every 6 (six) hours as needed.    Marland Kitchen albuterol (VENTOLIN HFA) 108 (90 Base) MCG/ACT inhaler INHALE 2 PUFFS BY MOUTH EVERY 4 HOURS AS NEEDED FOR WHEEZING 18 g 6  . amLODipine (NORVASC) 10 MG tablet TAKE 1 TABLET (10 MG TOTAL) BY MOUTH DAILY. 90 tablet 3  . cetirizine (ZYRTEC) 10 MG tablet Take 10 mg by mouth daily.    . cholecalciferol (VITAMIN D) 1000 units tablet Take 5,000 Units by mouth daily.     . DULoxetine (CYMBALTA) 60 MG capsule TAKE 1 CAPSULE BY MOUTH DAILY. 90 capsule 0  . etodolac (LODINE) 400 MG tablet Take 1 tablet (400 mg total) by mouth 2 (two) times daily. 28 tablet 2  . fluticasone (FLOVENT HFA) 44 MCG/ACT inhaler 2 puffs bid 1 Inhaler 5  . hydrochlorothiazide (MICROZIDE) 12.5 MG capsule Take 1 capsule (12.5 mg total) by mouth daily. 90 capsule 4  . ibuprofen (ADVIL,MOTRIN) 200 MG tablet Take 200 mg by mouth every 6 (six) hours as needed.    Marland Kitchen levothyroxine (SYNTHROID, LEVOTHROID) 25 MCG tablet Take 1 tablet (25 mcg total) by mouth daily before breakfast. 90 tablet 3  . montelukast (SINGULAIR) 10 MG tablet Take 1 tablet (10 mg total) by mouth at bedtime. 90 tablet 1  . Naproxen Sodium (ALEVE PO) Take by mouth as needed.    Marland Kitchen tiZANidine  (ZANAFLEX) 4 MG tablet One p o up to tid prn pain 30 tablet 1  . valACYclovir (VALTREX) 1000 MG tablet Take 1,000 mg by mouth once as needed.    . vitamin B-12 (CYANOCOBALAMIN) 500 MCG tablet Take 500 mcg by mouth daily.     No current facility-administered medications on file prior to visit.     PAST MEDICAL HISTORY: Past Medical History:  Diagnosis Date  . Allergies   . Asthma   . Back pain   . Breast nodule 02/04/2014  . Constipation   . Constipation   . Depression   . Dizziness   . Dry skin   . Easy bruising   . Encounter for other contraceptive management 10/21/2014  . Enlarged thyroid 09/27/2012  . Fatigue   . Fatty liver   . Fibromyalgia   . Floaters in visual field   . Glands swollen   . Hashimoto's disease   .  Headache   . Heat intolerance   . Hidradenitis   . High cholesterol   . Hoarseness   . Hypertension   . Hypothyroidism   . IBS (irritable bowel syndrome)   . Joint pain   . Leg cramping   . Migraine headache   . Muscle stiffness   . Myalgia   . Nausea   . PMS (premenstrual syndrome) 09/27/2012  . Stress   . Swelling of lower extremity   . Tachycardia    patient states she has long history of resting tachycardia  . Thyroid nodule   . Uterine prolapse 10/21/2014  . Vaginal irritation 10/08/2015  . Vision changes   . Wheezing   . Yeast infection 10/08/2015    PAST SURGICAL HISTORY: History reviewed. No pertinent surgical history.  SOCIAL HISTORY: Social History   Tobacco Use  . Smoking status: Never Smoker  . Smokeless tobacco: Never Used  Substance Use Topics  . Alcohol use: No  . Drug use: No    FAMILY HISTORY: Family History  Problem Relation Age of Onset  . Diabetes Mother   . Hypertension Mother   . Fibromyalgia Mother   . Obesity Mother   . Heart disease Father   . Hypertension Father   . Hyperlipidemia Father   . Obesity Father   . Sleep apnea Father   . Hypertension Maternal Grandmother   . Vision loss Maternal Grandmother    . Glaucoma Maternal Grandmother   . Stroke Maternal Grandmother   . Cancer Maternal Grandfather        melonoma  . Heart disease Paternal Grandmother   . Hypertension Paternal Grandmother   . Hyperlipidemia Paternal Grandmother   . Other Paternal Grandmother        macular degeneration  . Cancer Paternal Grandfather        lung  . Cancer Maternal Uncle        lung  . Colon cancer Maternal Uncle   . Cancer Paternal Aunt        breast    ROS: Review of Systems  Constitutional: Negative for weight loss.  Psychiatric/Behavioral: Positive for depression. Negative for suicidal ideas.    PHYSICAL EXAM: Pt in no acute distress  RECENT LABS AND TESTS: BMET    Component Value Date/Time   NA 140 05/15/2018 1203   K 3.5 05/15/2018 1203   CL 99 05/15/2018 1203   CO2 23 05/15/2018 1203   GLUCOSE 83 05/15/2018 1203   GLUCOSE 81 10/17/2013 0901   BUN 7 05/15/2018 1203   CREATININE 0.61 05/15/2018 1203   CREATININE 0.55 10/17/2013 0901   CALCIUM 9.3 05/15/2018 1203   GFRNONAA 119 05/15/2018 1203   GFRAA 138 05/15/2018 1203   Lab Results  Component Value Date   HGBA1C 5.4 05/15/2018   HGBA1C 5.2 04/24/2015   HGBA1C 5.4 10/02/2012   Lab Results  Component Value Date   INSULIN 13.7 05/15/2018   CBC    Component Value Date/Time   WBC 12.3 (H) 10/27/2016 1653   WBC 6.7 10/17/2013 0901   RBC 4.69 10/27/2016 1653   RBC 4.67 10/17/2013 0901   HGB 13.3 10/27/2016 1653   HCT 40.5 10/27/2016 1653   PLT 434 (H) 10/27/2016 1653   MCV 86 10/27/2016 1653   MCH 28.4 10/27/2016 1653   MCH 29.1 10/17/2013 0901   MCHC 32.8 10/27/2016 1653   MCHC 35.1 10/17/2013 0901   RDW 13.1 10/27/2016 1653   Iron/TIBC/Ferritin/ %Sat No results found for: IRON, TIBC,  FERRITIN, IRONPCTSAT Lipid Panel     Component Value Date/Time   CHOL 252 (H) 02/16/2018 0812   TRIG 100 02/16/2018 0812   HDL 43 02/16/2018 0812   CHOLHDL 5.9 (H) 02/16/2018 0812   CHOLHDL 4.4 10/17/2013 0901   VLDL 16  10/17/2013 0901   LDLCALC 189 (H) 02/16/2018 0812   Hepatic Function Panel     Component Value Date/Time   PROT 7.1 05/15/2018 1203   ALBUMIN 4.5 05/15/2018 1203   AST 24 05/15/2018 1203   ALT 28 05/15/2018 1203   ALKPHOS 140 (H) 05/15/2018 1203   BILITOT 0.7 05/15/2018 1203   BILIDIR 0.14 02/16/2018 0812      Component Value Date/Time   TSH 2.110 05/15/2018 1203   TSH 2.690 02/16/2018 0953   TSH 1.780 09/27/2017 0912     Ref. Range 05/15/2018 12:03  Vitamin D, 25-Hydroxy Latest Ref Range: 30.0 - 100.0 ng/mL 52.5    I, [place] Crane, am acting as Energy manager for [employer]. Manson Passey, DO  I have reviewed the above documentation for accuracy and completeness, and I agree with the above. -Corinna Capra, DO

## 2018-08-01 ENCOUNTER — Telehealth: Payer: Self-pay | Admitting: Family Medicine

## 2018-08-01 MED ORDER — TRIAMCINOLONE ACETONIDE 0.1 % EX CREA: TOPICAL_CREAM | CUTANEOUS | 1 refills | Status: DC

## 2018-08-01 NOTE — Telephone Encounter (Signed)
Prescription sent electronically to pharmacy. Patient advised per Dr Brett Canales: Trying to discourage oral steroids righ now for anyone with potential of exposures(like health care workers!) because it lowers our resistance to catching virus infections. Recommend triamcinolone cr 0.1 per cent 60 g bid, affected area,one refill, bernadry qhs prn itching Patient verbalized understanding.

## 2018-08-01 NOTE — Telephone Encounter (Signed)
Trying to discourage oral steroids righ now for anyone with potentialo exposures(like health care workers!) because it lowers our resistance to catching virus infections. Recommend triamcinolone cr 0.1 per cent 60 g bid, affected area,one refill, bernadry qhs prn itching

## 2018-08-01 NOTE — Telephone Encounter (Signed)
Pt has poison oak everywhere after being in the garden over the weekend. She would like to know what she can do.   [facility] - [place], [place] - [address]

## 2018-08-08 ENCOUNTER — Other Ambulatory Visit: Payer: Self-pay

## 2018-08-08 ENCOUNTER — Ambulatory Visit (INDEPENDENT_AMBULATORY_CARE_PROVIDER_SITE_OTHER): Payer: No Typology Code available for payment source | Admitting: Bariatrics

## 2018-08-08 ENCOUNTER — Encounter (INDEPENDENT_AMBULATORY_CARE_PROVIDER_SITE_OTHER): Payer: Self-pay | Admitting: Bariatrics

## 2018-08-08 DIAGNOSIS — F3289 Other specified depressive episodes: Secondary | ICD-10-CM | POA: Diagnosis not present

## 2018-08-08 DIAGNOSIS — Z6835 Body mass index (BMI) 35.0-35.9, adult: Secondary | ICD-10-CM | POA: Diagnosis not present

## 2018-08-08 DIAGNOSIS — E8881 Metabolic syndrome: Secondary | ICD-10-CM | POA: Diagnosis not present

## 2018-08-09 NOTE — Progress Notes (Signed)
Office: [phone]  /  Fax: [phone] TeleHealth Visit:  Hailey Holden has verbally consented to this TeleHealth visit today. The patient is located at home, the provider is located at the [facility] office. The participants in this visit include the listed provider and patient and any and all parties involved. The visit was conducted today via FaceTime.  HPI:   Chief Complaint: OBESITY Hailey Holden is here to discuss her progress with her obesity treatment plan. She is on the Category 3 plan and is following her eating plan approximately 40 to 45 % of the time. She states she is exercising 0 minutes 0 times per week. Hailey Holden states that she has gained 1 pound (weight 205 lbs). She does some stress eating. We were unable to weigh the patient today for this TeleHealth visit. She feels as if she has gained weight since her last visit. She has gained 1 lb since starting treatment with [name].  Insulin Resistance Hailey Holden has a diagnosis of insulin resistance based on her elevated fasting insulin level >5. Although Hailey Holden's blood glucose readings are still under good control, insulin resistance puts her at greater risk of metabolic syndrome and diabetes. She is not on medications currently and continues to work on diet and exercise to decrease risk of diabetes. Hailey Holden denies polyphagia.  Depression with emotional eating behaviors Hailey Holden is struggling with emotional eating and using food for comfort to the extent that it is negatively impacting her health. She often snacks when she is not hungry. Hailey Holden sometimes feels she is out of control and then feels guilty that she made poor food choices. She has been working on behavior modification techniques to help reduce her emotional eating and has been somewhat successful. She shows no sign of suicidal or homicidal ideations.  Depression screen [name] 2/9 05/15/2018 11/02/2017 10/27/2016 06/19/2015 05/27/2015  Decreased Interest 1 0 0 0 0  Down,  Depressed, Hopeless 1 0 0 0 0  PHQ - 2 Score 2 0 0 0 0  Altered sleeping 1 - - - -  Tired, decreased energy 2 - - - -  Change in appetite 2 - - - -  Feeling bad or failure about yourself  1 - - - -  Trouble concentrating 2 - - - -  Moving slowly or fidgety/restless 0 - - - -  Suicidal thoughts 0 - - - -  PHQ-9 Score 10 - - - -  Difficult doing work/chores Not difficult at all - - - -    ASSESSMENT AND PLAN:  Insulin resistance  Other depression - with emotional eating - Plan: buPROPion (WELLBUTRIN SR) 150 MG 12 hr tablet  Class 2 severe obesity with serious comorbidity and body mass index (BMI) of 35.0 to 35.9 in adult, unspecified obesity type (HCC)  PLAN:  Insulin Resistance Hailey Holden will begin exercise and she will continue to work on weight loss, increasing lean protein and decreasing simple carbohydrates in her diet to help decrease the risk of diabetes. She was informed that eating too many simple carbohydrates or too many calories at one sitting increases the likelihood of GI side effects. Hailey Holden agreed to follow up with [name] as directed to monitor her progress.  Depression with Emotional Eating Behaviors We discussed behavior modification techniques today to help Hailey Holden deal with her emotional eating and depression. She agreed to continue Wellbutrin SR 150 mg daily #30 with no refills and follow up as directed.  Obesity Hailey Holden is currently in the action stage of  change. As such, her goal is to continue with weight loss efforts She has agreed to follow the Category 3 plan Hailey Holden has been instructed to work up to a goal of 150 minutes of combined cardio and strengthening exercise per week for weight loss and overall health benefits. We discussed the following Behavioral Modification Strategies today: increase H2O intake, no skipping meals, keeping healthy foods in the home, increasing lean protein intake, decreasing simple carbohydrates, increasing vegetables, decrease eating out  and work on meal planning and easy cooking plans Hailey Holden will weigh herself at home before each visit.  Hailey Holden has agreed to follow up with our clinic in 2 weeks. She was informed of the importance of frequent follow up visits to maximize her success with intensive lifestyle modifications for her multiple health conditions.  ALLERGIES: Allergies  Allergen Reactions  . Clindamycin/Lincomycin Shortness Of Breath and Rash  . Tdap [Tetanus-Diphth-Acell Pertussis] Itching    Itching, swelling in the arm, chest tightness, wheezing, headache, SOB  . Latex   . Penicillins     MEDICATIONS: Current Outpatient Medications on File Prior to Visit  Medication Sig Dispense Refill  . acetaminophen (TYLENOL) 500 MG tablet Take 500 mg by mouth every 6 (six) hours as needed.    Marland Kitchen albuterol (VENTOLIN HFA) 108 (90 Base) MCG/ACT inhaler INHALE 2 PUFFS BY MOUTH EVERY 4 HOURS AS NEEDED FOR WHEEZING 18 g 6  . amLODipine (NORVASC) 10 MG tablet TAKE 1 TABLET (10 MG TOTAL) BY MOUTH DAILY. 90 tablet 3  . buPROPion (WELLBUTRIN SR) 150 MG 12 hr tablet Take 1 tablet (150 mg total) by mouth daily. 30 tablet 0  . cetirizine (ZYRTEC) 10 MG tablet Take 10 mg by mouth daily.    . cholecalciferol (VITAMIN D) 1000 units tablet Take 5,000 Units by mouth daily.     . DULoxetine (CYMBALTA) 60 MG capsule TAKE 1 CAPSULE BY MOUTH DAILY. 90 capsule 0  . etodolac (LODINE) 400 MG tablet Take 1 tablet (400 mg total) by mouth 2 (two) times daily. 28 tablet 2  . fluticasone (FLOVENT HFA) 44 MCG/ACT inhaler 2 puffs bid 1 Inhaler 5  . hydrochlorothiazide (MICROZIDE) 12.5 MG capsule Take 1 capsule (12.5 mg total) by mouth daily. 90 capsule 4  . ibuprofen (ADVIL,MOTRIN) 200 MG tablet Take 200 mg by mouth every 6 (six) hours as needed.    Marland Kitchen levothyroxine (SYNTHROID, LEVOTHROID) 25 MCG tablet Take 1 tablet (25 mcg total) by mouth daily before breakfast. 90 tablet 3  . montelukast (SINGULAIR) 10 MG tablet Take 1 tablet (10 mg total) by mouth at  bedtime. 90 tablet 1  . Naproxen Sodium (ALEVE PO) Take by mouth as needed.    Marland Kitchen tiZANidine (ZANAFLEX) 4 MG tablet One p o up to tid prn pain 30 tablet 1  . triamcinolone cream (KENALOG) 0.1 % Apply twice a day to affected area 60 g 1  . valACYclovir (VALTREX) 1000 MG tablet Take 1,000 mg by mouth once as needed.    . vitamin B-12 (CYANOCOBALAMIN) 500 MCG tablet Take 500 mcg by mouth daily.     No current facility-administered medications on file prior to visit.     PAST MEDICAL HISTORY: Past Medical History:  Diagnosis Date  . Allergies   . Asthma   . Back pain   . Breast nodule 02/04/2014  . Constipation   . Constipation   . Depression   . Dizziness   . Dry skin   . Easy bruising   . Encounter for  other contraceptive management 10/21/2014  . Enlarged thyroid 09/27/2012  . Fatigue   . Fatty liver   . Fibromyalgia   . Floaters in visual field   . Glands swollen   . Hashimoto's disease   . Headache   . Heat intolerance   . Hidradenitis   . High cholesterol   . Hoarseness   . Hypertension   . Hypothyroidism   . IBS (irritable bowel syndrome)   . Joint pain   . Leg cramping   . Migraine headache   . Muscle stiffness   . Myalgia   . Nausea   . PMS (premenstrual syndrome) 09/27/2012  . Stress   . Swelling of lower extremity   . Tachycardia    patient states she has long history of resting tachycardia  . Thyroid nodule   . Uterine prolapse 10/21/2014  . Vaginal irritation 10/08/2015  . Vision changes   . Wheezing   . Yeast infection 10/08/2015    PAST SURGICAL HISTORY: History reviewed. No pertinent surgical history.  SOCIAL HISTORY: Social History   Tobacco Use  . Smoking status: Never Smoker  . Smokeless tobacco: Never Used  Substance Use Topics  . Alcohol use: No  . Drug use: No    FAMILY HISTORY: Family History  Problem Relation Age of Onset  . Diabetes Mother   . Hypertension Mother   . Fibromyalgia Mother   . Obesity Mother   . Heart disease  Father   . Hypertension Father   . Hyperlipidemia Father   . Obesity Father   . Sleep apnea Father   . Hypertension Maternal Grandmother   . Vision loss Maternal Grandmother   . Glaucoma Maternal Grandmother   . Stroke Maternal Grandmother   . Cancer Maternal Grandfather        melonoma  . Heart disease Paternal Grandmother   . Hypertension Paternal Grandmother   . Hyperlipidemia Paternal Grandmother   . Other Paternal Grandmother        macular degeneration  . Cancer Paternal Grandfather        lung  . Cancer Maternal Uncle        lung  . Colon cancer Maternal Uncle   . Cancer Paternal Aunt        breast    ROS: Review of Systems  Constitutional: Negative for weight loss.  Endo/Heme/Allergies:       Negative for polyphagia  Psychiatric/Behavioral: Positive for depression. Negative for suicidal ideas.    PHYSICAL EXAM: Pt in no acute distress  RECENT LABS AND TESTS: BMET    Component Value Date/Time   NA 140 05/15/2018 1203   K 3.5 05/15/2018 1203   CL 99 05/15/2018 1203   CO2 23 05/15/2018 1203   GLUCOSE 83 05/15/2018 1203   GLUCOSE 81 10/17/2013 0901   BUN 7 05/15/2018 1203   CREATININE 0.61 05/15/2018 1203   CREATININE 0.55 10/17/2013 0901   CALCIUM 9.3 05/15/2018 1203   GFRNONAA 119 05/15/2018 1203   GFRAA 138 05/15/2018 1203   Lab Results  Component Value Date   HGBA1C 5.4 05/15/2018   HGBA1C 5.2 04/24/2015   HGBA1C 5.4 10/02/2012   Lab Results  Component Value Date   INSULIN 13.7 05/15/2018   CBC    Component Value Date/Time   WBC 12.3 (H) 10/27/2016 1653   WBC 6.7 10/17/2013 0901   RBC 4.69 10/27/2016 1653   RBC 4.67 10/17/2013 0901   HGB 13.3 10/27/2016 1653   HCT 40.5 10/27/2016 1653  PLT 434 (H) 10/27/2016 1653   MCV 86 10/27/2016 1653   MCH 28.4 10/27/2016 1653   MCH 29.1 10/17/2013 0901   MCHC 32.8 10/27/2016 1653   MCHC 35.1 10/17/2013 0901   RDW 13.1 10/27/2016 1653   Iron/TIBC/Ferritin/ %Sat No results found for: IRON,  TIBC, FERRITIN, IRONPCTSAT Lipid Panel     Component Value Date/Time   CHOL 252 (H) 02/16/2018 0812   TRIG 100 02/16/2018 0812   HDL 43 02/16/2018 0812   CHOLHDL 5.9 (H) 02/16/2018 0812   CHOLHDL 4.4 10/17/2013 0901   VLDL 16 10/17/2013 0901   LDLCALC 189 (H) 02/16/2018 0812   Hepatic Function Panel     Component Value Date/Time   PROT 7.1 05/15/2018 1203   ALBUMIN 4.5 05/15/2018 1203   AST 24 05/15/2018 1203   ALT 28 05/15/2018 1203   ALKPHOS 140 (H) 05/15/2018 1203   BILITOT 0.7 05/15/2018 1203   BILIDIR 0.14 02/16/2018 0812      Component Value Date/Time   TSH 2.110 05/15/2018 1203   TSH 2.690 02/16/2018 0953   TSH 1.780 09/27/2017 0912     Ref. Range 05/15/2018 12:03  Vitamin D, 25-Hydroxy Latest Ref Range: 30.0 - 100.0 ng/mL 52.5    I, [place] CraneJoanne Murray, am acting as Energy managertranscriptionist for [employer]. Manson PasseyBrown, DO  I have reviewed the above documentation for accuracy and completeness, and I agree with the above. -Corinna CapraAngel Katlynne Mckercher, DO

## 2018-08-13 MED ORDER — BUPROPION HCL ER (SR) 150 MG PO TB12: 150.0000 mg | ORAL_TABLET | Freq: Every day | ORAL | 0 refills | Status: DC

## 2018-08-13 MED FILL — LEVOTHYROXINE 25 MCG TABLET: 25 | 90 days supply | Qty: 90 | Fill #0

## 2018-08-13 MED FILL — HYDROCHLOROTHIAZIDE 12.5 MG: 12.5 | 90 days supply | Qty: 90 | Fill #0

## 2018-08-13 MED FILL — BUPROPION HCL SR 150 MG TAB: 150 | 30 days supply | Qty: 30 | Fill #0

## 2018-08-15 ENCOUNTER — Other Ambulatory Visit: Payer: Self-pay | Admitting: *Deleted

## 2018-08-15 ENCOUNTER — Telehealth: Payer: Self-pay | Admitting: *Deleted

## 2018-08-15 ENCOUNTER — Encounter (INDEPENDENT_AMBULATORY_CARE_PROVIDER_SITE_OTHER): Payer: Self-pay | Admitting: Bariatrics

## 2018-08-15 MED ORDER — PREDNISONE 20 MG PO TABS: ORAL_TABLET | ORAL | 0 refills | Status: DC

## 2018-08-15 NOTE — Telephone Encounter (Signed)
Pt sent me a staff message see below  [name], LPN  Metro Kung, LPN        Toniann Fail, can you please ask Dr. Brett Canales if there is anything else I can do for my poison oak, I have been taking Benadryl and using the steroid cream he sent in for me. It is getting worse. Can you ask if there is something else I can do?   Thanks so much,  Hailey Holden    Pt called on 08/01/18 and dr Brett Canales prescribed triamcinolone cream. 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 Please advise

## 2018-08-15 NOTE — Telephone Encounter (Signed)
Discussed with pt. She wanted prednisone. Med sent to pharm.

## 2018-08-15 NOTE — Telephone Encounter (Signed)
Given that the steroid cream did not help It is reasonable to do a short course of prednisone Prednisone can slightly increase her risk of catching illness but minimal It would be reasonable to go ahead with prednisone 20 mg 3 daily for the next 2 days then 2 a day for 3 days then 1 a day for 3 days #15 It can cause sugars to go up If patient is opposed to prednisone I can increase the strength of the steroid cream to see if that might help

## 2018-08-22 ENCOUNTER — Encounter (INDEPENDENT_AMBULATORY_CARE_PROVIDER_SITE_OTHER): Payer: Self-pay | Admitting: Bariatrics

## 2018-08-22 ENCOUNTER — Ambulatory Visit (INDEPENDENT_AMBULATORY_CARE_PROVIDER_SITE_OTHER): Payer: No Typology Code available for payment source | Admitting: Bariatrics

## 2018-08-22 ENCOUNTER — Other Ambulatory Visit: Payer: Self-pay

## 2018-08-22 DIAGNOSIS — F3289 Other specified depressive episodes: Secondary | ICD-10-CM | POA: Diagnosis not present

## 2018-08-22 DIAGNOSIS — E8881 Metabolic syndrome: Secondary | ICD-10-CM

## 2018-08-22 DIAGNOSIS — Z6835 Body mass index (BMI) 35.0-35.9, adult: Secondary | ICD-10-CM

## 2018-08-23 NOTE — Progress Notes (Signed)
Office: [phone]  /  Fax: [phone] TeleHealth Visit:  Hailey Holden has verbally consented to this TeleHealth visit today. The patient is located at work, the provider is located at the [facility] office. The participants in this visit include the listed provider and patient and any and all parties involved. The visit was conducted today via FaceTime.  HPI:   Chief Complaint: OBESITY Hailey Holden is here to discuss her progress with her obesity treatment plan. She is on the Category 3 plan and is following her eating plan approximately 90 % of the time. She states she is doing yard/garden work for 30 minutes 3 times per week. Hailey Holden states that she has lost 1 pound since her last visit. She has noticed a loss of inches (weight 204 lbs). She states that she has taken Prednisone for poison oak. We were unable to weigh the patient today for this TeleHealth visit. She feels as if she has lost weight since her last visit. She has lost 0 lbs since starting treatment with us.  Insulin Resistance Hailey Holden has a diagnosis of insulin resistance based on her elevated fasting insulin level >5. Although Hailey Holden's blood glucose readings are still under good control, insulin resistance puts her at greater risk of metabolic syndrome and diabetes. She is taking medications currently and continues to work on diet and exercise to decrease risk of diabetes.  Depression with emotional eating behaviors Hailey Holden struggles with emotional eating and using food for comfort to the extent that it is negatively impacting her health. She often snacks when she is not hungry. Hailey Holden sometimes feels she is out of control and then feels guilty that she made poor food choices. Hailey Holden is taking Wellbutrin. She has been working on behavior modification techniques to help reduce her emotional eating and has been somewhat successful. She shows no sign of suicidal or homicidal ideations.  Dep[facility] screen [facility] 2/9  05/15/2018 11/02/2017 10/27/2016 06/19/2015 05/27/2015  Decreased Interest 1 0 0 0 0  Down, Depressed, Hopeless 1 0 0 0 0  PHQ - 2 Score 2 0 0 0 0  Altered sleeping 1 - - - -  Tired, decreased energy 2 - - - -  Change in appetite 2 - - - -  Feeling bad or failure about yourself  1 - - - -  Trouble concentrating 2 - - - -  Moving slowly or fidgety/restless 0 - - - -  Suicidal thoughts 0 - - - -  PHQ-9 Score 10 - - - -  Difficult doing work/chores Not difficult at all - - - -    ASSESSMENT AND PLAN:  Insulin resistance  Other depression - with emotional eating  Class 2 severe obesity with serious comorbidity and body mass index (BMI) of 35.0 to 35.9 in adult, unspecified obesity type (HCC)  PLAN:  Insulin Resistance Hailey Holden will continue activities and she will continue to work on weight loss, increasing lean protein and decreasing simple carbohydrates in her diet to help decrease the risk of diabetes. She was informed that eating too many simple carbohydrates or too many calories at one sitting increases the likelihood of GI side effects. Hailey Holden agreed to follow up with us as directed to monitor her progress.  Depression with Emotional Eating Behaviors We discussed behavior modification techniques today to help Hailey Holden deal with her emotional eating and depression. She will continue to take Wellbutrin SR 150 mg qd and follow up as directed.  Obesity Hailey Holden is currently in the  action stage of change. As such, her goal is to continue with weight loss efforts She has agreed to follow the Category 3 plan Hailey Holden will continue activity for weight loss and overall health benefits. We discussed the following Behavioral Modification Strategies today: increase H2O intake, increasing lean protein intake, decreasing simple carbohydrates and increasing vegetables Hailey Holden will weigh herself at home until she returns to the office.  Hailey Holden has agreed to follow up with our clinic in 2 weeks. She was  informed of the importance of frequent follow up visits to maximize her success with intensive lifestyle modifications for her multiple health conditions.  ALLERGIES: Allergies  Allergen Reactions  . Clindamycin/Lincomycin Shortness Of Breath and Rash  . Tdap [Tetanus-Diphth-Acell Pertussis] Itching    Itching, swelling in the arm, chest tightness, wheezing, headache, SOB  . Latex   . Penicillins     MEDICATIONS: Current Outpatient Medications on File Prior to Visit  Medication Sig Dispense Refill  . acetaminophen (TYLENOL) 500 MG tablet Take 500 mg by mouth every 6 (six) hours as needed.    Marland Kitchen albuterol (VENTOLIN HFA) 108 (90 Base) MCG/ACT inhaler INHALE 2 PUFFS BY MOUTH EVERY 4 HOURS AS NEEDED FOR WHEEZING 18 g 6  . amLODipine (NORVASC) 10 MG tablet TAKE 1 TABLET (10 MG TOTAL) BY MOUTH DAILY. 90 tablet 3  . buPROPion (WELLBUTRIN SR) 150 MG 12 hr tablet Take 1 tablet (150 mg total) by mouth daily. 30 tablet 0  . cetirizine (ZYRTEC) 10 MG tablet Take 10 mg by mouth daily.    . cholecalciferol (VITAMIN D) 1000 units tablet Take 5,000 Units by mouth daily.     . DULoxetine (CYMBALTA) 60 MG capsule TAKE 1 CAPSULE BY MOUTH DAILY. 90 capsule 0  . etodolac (LODINE) 400 MG tablet Take 1 tablet (400 mg total) by mouth 2 (two) times daily. 28 tablet 2  . fluticasone (FLOVENT HFA) 44 MCG/ACT inhaler 2 puffs bid 1 Inhaler 5  . hydrochlorothiazide (MICROZIDE) 12.5 MG capsule Take 1 capsule (12.5 mg total) by mouth daily. 90 capsule 4  . ibuprofen (ADVIL,MOTRIN) 200 MG tablet Take 200 mg by mouth every 6 (six) hours as needed.    Marland Kitchen levothyroxine (SYNTHROID, LEVOTHROID) 25 MCG tablet Take 1 tablet (25 mcg total) by mouth daily before breakfast. 90 tablet 3  . montelukast (SINGULAIR) 10 MG tablet Take 1 tablet (10 mg total) by mouth at bedtime. 90 tablet 1  . Naproxen Sodium (ALEVE PO) Take by mouth as needed.    . predniSONE (DELTASONE) 20 MG tablet Take 3 tablets for 2 days, then 2 tablets for 3 days  then one tablet for 3 days 15 tablet 0  . tiZANidine (ZANAFLEX) 4 MG tablet One p o up to tid prn pain 30 tablet 1  . triamcinolone cream (KENALOG) 0.1 % Apply twice a day to affected area 60 g 1  . valACYclovir (VALTREX) 1000 MG tablet Take 1,000 mg by mouth once as needed.    . vitamin B-12 (CYANOCOBALAMIN) 500 MCG tablet Take 500 mcg by mouth daily.     No current facility-administered medications on file prior to visit.     PAST MEDICAL HISTORY: Past Medical History:  Diagnosis Date  . Allergies   . Asthma   . Back pain   . Breast nodule 02/04/2014  . Constipation   . Constipation   . Depression   . Dizziness   . Dry skin   . Easy bruising   . Encounter for other contraceptive management  10/21/2014  . Enlarged thyroid 09/27/2012  . Fatigue   . Fatty liver   . Fibromyalgia   . Floaters in visual field   . Glands swollen   . Hashimoto's disease   . Headache   . Heat intolerance   . Hidradenitis   . High cholesterol   . Hoarseness   . Hypertension   . Hypothyroidism   . IBS (irritable bowel syndrome)   . Joint pain   . Leg cramping   . Migraine headache   . Muscle stiffness   . Myalgia   . Nausea   . PMS (premenstrual syndrome) 09/27/2012  . Stress   . Swelling of lower extremity   . Tachycardia    patient states she has long history of resting tachycardia  . Thyroid nodule   . Uterine prolapse 10/21/2014  . Vaginal irritation 10/08/2015  . Vision changes   . Wheezing   . Yeast infection 10/08/2015    PAST SURGICAL HISTORY: History reviewed. No pertinent surgical history.  SOCIAL HISTORY: Social History   Tobacco Use  . Smoking status: Never Smoker  . Smokeless tobacco: Never Used  Substance Use Topics  . Alcohol use: No  . Drug use: No    FAMILY HISTORY: Family History  Problem Relation Age of Onset  . Diabetes Mother   . Hypertension Mother   . Fibromyalgia Mother   . Obesity Mother   . Heart disease Father   . Hypertension Father   .  Hyperlipidemia Father   . Obesity Father   . Sleep apnea Father   . Hypertension Maternal Grandmother   . Vision loss Maternal Grandmother   . Glaucoma Maternal Grandmother   . Stroke Maternal Grandmother   . Cancer Maternal Grandfather        melonoma  . Heart disease Paternal Grandmother   . Hypertension Paternal Grandmother   . Hyperlipidemia Paternal Grandmother   . Other Paternal Grandmother        macular degeneration  . Cancer Paternal Grandfather        lung  . Cancer Maternal Uncle        lung  . Colon cancer Maternal Uncle   . Cancer Paternal Aunt        breast    ROS: Review of Systems  Constitutional: Positive for weight loss.  Psychiatric/Behavioral: Positive for depression. Negative for suicidal ideas.    PHYSICAL EXAM: Pt in no acute distress  RECENT LABS AND TESTS: BMET    Component Value Date/Time   NA 140 05/15/2018 1203   K 3.5 05/15/2018 1203   CL 99 05/15/2018 1203   CO2 23 05/15/2018 1203   GLUCOSE 83 05/15/2018 1203   GLUCOSE 81 10/17/2013 0901   BUN 7 05/15/2018 1203   CREATININE 0.61 05/15/2018 1203   CREATININE 0.55 10/17/2013 0901   CALCIUM 9.3 05/15/2018 1203   GFRNONAA 119 05/15/2018 1203   GFRAA 138 05/15/2018 1203   Lab Results  Component Value Date   HGBA1C 5.4 05/15/2018   HGBA1C 5.2 04/24/2015   HGBA1C 5.4 10/02/2012   Lab Results  Component Value Date   INSULIN 13.7 05/15/2018   CBC    Component Value Date/Time   WBC 12.3 (H) 10/27/2016 1653   WBC 6.7 10/17/2013 0901   RBC 4.69 10/27/2016 1653   RBC 4.67 10/17/2013 0901   HGB 13.3 10/27/2016 1653   HCT 40.5 10/27/2016 1653   PLT 434 (H) 10/27/2016 1653   MCV 86 10/27/2016 1653  MCH 28.4 10/27/2016 1653   MCH 29.1 10/17/2013 0901   MCHC 32.8 10/27/2016 1653   MCHC 35.1 10/17/2013 0901   RDW 13.1 10/27/2016 1653   Iron/TIBC/Ferritin/ %Sat No results found for: IRON, TIBC, FERRITIN, IRONPCTSAT Lipid Panel     Component Value Date/Time   CHOL 252 (H)  02/16/2018 0812   TRIG 100 02/16/2018 0812   HDL 43 02/16/2018 0812   CHOLHDL 5.9 (H) 02/16/2018 0812   CHOLHDL 4.4 10/17/2013 0901   VLDL 16 10/17/2013 0901   LDLCALC 189 (H) 02/16/2018 0812   Hepatic Function Panel     Component Value Date/Time   PROT 7.1 05/15/2018 1203   ALBUMIN 4.5 05/15/2018 1203   AST 24 05/15/2018 1203   ALT 28 05/15/2018 1203   ALKPHOS 140 (H) 05/15/2018 1203   BILITOT 0.7 05/15/2018 1203   BILIDIR 0.14 02/16/2018 0812      Component Value Date/Time   TSH 2.110 05/15/2018 1203   TSH 2.690 02/16/2018 0953   TSH 1.780 09/27/2017 0912     Ref. Range 05/15/2018 12:03  Vitamin D, 25-Hydroxy Latest Ref Range: 30.0 - 100.0 ng/mL 52.5    I, [place] Crane, am acting as Energy manager for [employer]. Manson Passey, DO  I have reviewed the above documentation for accuracy and completeness, and I agree with the above. -Corinna Capra, DO

## 2018-09-04 ENCOUNTER — Encounter (INDEPENDENT_AMBULATORY_CARE_PROVIDER_SITE_OTHER): Payer: Self-pay

## 2018-09-05 ENCOUNTER — Ambulatory Visit (INDEPENDENT_AMBULATORY_CARE_PROVIDER_SITE_OTHER): Payer: No Typology Code available for payment source | Admitting: Bariatrics

## 2018-09-05 ENCOUNTER — Encounter (INDEPENDENT_AMBULATORY_CARE_PROVIDER_SITE_OTHER): Payer: Self-pay | Admitting: Bariatrics

## 2018-09-05 ENCOUNTER — Other Ambulatory Visit: Payer: Self-pay

## 2018-09-05 DIAGNOSIS — F3289 Other specified depressive episodes: Secondary | ICD-10-CM

## 2018-09-05 DIAGNOSIS — E7800 Pure hypercholesterolemia, unspecified: Secondary | ICD-10-CM

## 2018-09-05 DIAGNOSIS — Z6835 Body mass index (BMI) 35.0-35.9, adult: Secondary | ICD-10-CM

## 2018-09-05 DIAGNOSIS — E8881 Metabolic syndrome: Secondary | ICD-10-CM | POA: Diagnosis not present

## 2018-09-06 NOTE — Progress Notes (Signed)
Office: [phone]  /  Fax: [phone] TeleHealth Visit:  DANNETTA LEKAS has verbally consented to this TeleHealth visit today. The patient is located at work, the provider is located at the [facility] office. The participants in this visit include the listed provider and patient and any and all parties involved. The visit was conducted today via FaceTime.  HPI:   Chief Complaint: OBESITY Hailey Holden is here to discuss her progress with her obesity treatment plan. She is on the Category 3 plan and is following her eating plan approximately 30 % of the time. She states she is gardening and walking for 30 to 60 minutes 3 to 5 times per week. Britni thinks that she has gained a couple of pounds, but she has not weighed. She is not getting adequate water. We were unable to weigh the patient today for this TeleHealth visit. She feels as if she has gained weight since her last visit.   Insulin Resistance Trini has a diagnosis of insulin resistance based on her elevated fasting insulin level >5. Although Ruari's blood glucose readings are still under good control, insulin resistance puts her at greater risk of metabolic syndrome and diabetes. Hr last A1c was at 5.4 and last insulin level was at 13.7 Nasya is not taking medications currently and she continues to work on diet and exercise to decrease risk of diabetes.  Elevated Cholesterol Preciosa has elevated cholesterol and she is not on medications. Marianela has been trying to improve her cholesterol levels with intensive lifestyle modification including a low saturated fat diet, exercise and weight loss. She denies any chest pain or myalgias.  Depression with emotional eating behaviors Iyana struggles with emotional eating and using food for comfort to the extent that it is negatively impacting her health. She often snacks when she is not hungry. Jermya sometimes feels she is out of control and then feels guilty that she made poor  food choices. She had been on Wellbutrin, but she stopped secondary to nausea and dizziness. She has been working on behavior modification techniques to help reduce her emotional eating and has been somewhat successful. She shows no sign of suicidal or homicidal ideations.  Depression screen [facility] 2/9 05/15/2018 11/02/2017 10/27/2016 06/19/2015 05/27/2015  Decreased Interest 1 0 0 0 0  Down, Depressed, Hopeless 1 0 0 0 0  PHQ - 2 Score 2 0 0 0 0  Altered sleeping 1 - - - -  Tired, decreased energy 2 - - - -  Change in appetite 2 - - - -  Feeling bad or failure about yourself  1 - - - -  Trouble concentrating 2 - - - -  Moving slowly or fidgety/restless 0 - - - -  Suicidal thoughts 0 - - - -  PHQ-9 Score 10 - - - -  Difficult doing work/chores Not difficult at all - - - -     ASSESSMENT AND PLAN:  Insulin resistance - Plan: Hemoglobin A1c, Insulin, random, VITAMIN D 25 Hydroxy (Vit-D Deficiency, Fractures), T3, T4, free, TSH  Elevated cholesterol - Plan: Comprehensive metabolic panel, Hemoglobin A1c, Insulin, random, VITAMIN D 25 Hydroxy (Vit-D Deficiency, Fractures), T3, T4, free, TSH  Other depression - with emotional eating   Class 2 severe obesity with serious comorbidity and body mass index (BMI) of 35.0 to 35.9 in adult, unspecified obesity type (HCC)  PLAN:  Insulin Resistance Nechuma will continue to work on weight loss, increasing activities, increasing lean protein and decreasing simple carbohydrates  in her diet to help decrease the risk of diabetes. She was informed that eating too many simple carbohydrates or too many calories at one sitting increases the likelihood of GI side effects. [name] agreed to follow up with us as directed to monitor her progress.  Elevated Cholesterol [name] was informed of the American Heart Association Guidelines emphasizing intensive lifestyle modifications as the first line treatment for elevated cholesterol. We discussed many lifestyle modifications  today in depth, and [name] will continue to work on decreasing saturated fats such as fatty red meat, butter and many fried foods. She will also increase MUFA's, PUFA's, vegetables and lean protein in her diet and continue to work on exercise and weight loss efforts. We will check lipid panel and [name] will follow up as directed.  Depression with Emotional Eating Behaviors We discussed behavior modification techniques today to help [name] deal with her emotional eating and depression. She agreed to follow up as directed.  Obesity [name] is currently in the action stage of change. As such, her goal is to continue with weight loss efforts She has agreed to follow the Category 3 plan [name] has been instructed to work up to a goal of 150 minutes of combined cardio and strengthening exercise per week for weight loss and overall health benefits. We discussed the following Behavioral Modification Strategies today: increase H2O intake, no skipping meals, keeping healthy foods in the home, increasing lean protein intake, decreasing simple carbohydrates, increasing vegetables, decrease eating out and work on meal planning and easy cooking plans [name] will weigh herself at home until she returns to the office. We discussed the breakdown of protein today.  [name] has agreed to follow up with our clinic in 2 weeks. She was informed of the importance of frequent follow up visits to maximize her success with intensive lifestyle modifications for her multiple health conditions.  ALLERGIES: Allergies  Allergen Reactions  . Clindamycin/Lincomycin Shortness Of Breath and Rash  . Tdap [Tetanus-Diphth-Acell Pertussis] Itching    Itching, swelling in the arm, chest tightness, wheezing, headache, SOB  . Latex   . Penicillins     MEDICATIONS: Current Outpatient Medications on File Prior to Visit  Medication Sig Dispense Refill  . acetaminophen (TYLENOL) 500 MG tablet Take 500 mg by mouth every 6 (six) hours  as needed.    Marland Kitchen. albuterol (VENTOLIN HFA) 108 (90 Base) MCG/ACT inhaler INHALE 2 PUFFS BY MOUTH EVERY 4 HOURS AS NEEDED FOR WHEEZING 18 g 6  . amLODipine (NORVASC) 10 MG tablet TAKE 1 TABLET (10 MG TOTAL) BY MOUTH DAILY. 90 tablet 3  . cetirizine (ZYRTEC) 10 MG tablet Take 10 mg by mouth daily.    . cholecalciferol (VITAMIN D) 1000 units tablet Take 5,000 Units by mouth daily.     . DULoxetine (CYMBALTA) 60 MG capsule TAKE 1 CAPSULE BY MOUTH DAILY. 90 capsule 0  . etodolac (LODINE) 400 MG tablet Take 1 tablet (400 mg total) by mouth 2 (two) times daily. 28 tablet 2  . fluticasone (FLOVENT HFA) 44 MCG/ACT inhaler 2 puffs bid 1 Inhaler 5  . hydrochlorothiazide (MICROZIDE) 12.5 MG capsule Take 1 capsule (12.5 mg total) by mouth daily. 90 capsule 4  . ibuprofen (ADVIL,MOTRIN) 200 MG tablet Take 200 mg by mouth every 6 (six) hours as needed.    Marland Kitchen. levothyroxine (SYNTHROID, LEVOTHROID) 25 MCG tablet Take 1 tablet (25 mcg total) by mouth daily before breakfast. 90 tablet 3  . montelukast (SINGULAIR) 10 MG tablet Take 1 tablet (10 mg total)  by mouth at bedtime. 90 tablet 1  . Naproxen Sodium (ALEVE PO) Take by mouth as needed.    . predniSONE (DELTASONE) 20 MG tablet Take 3 tablets for 2 days, then 2 tablets for 3 days then one tablet for 3 days 15 tablet 0  . tiZANidine (ZANAFLEX) 4 MG tablet One p o up to tid prn pain 30 tablet 1  . triamcinolone cream (KENALOG) 0.1 % Apply twice a day to affected area 60 g 1  . valACYclovir (VALTREX) 1000 MG tablet Take 1,000 mg by mouth once as needed.    . vitamin B-12 (CYANOCOBALAMIN) 500 MCG tablet Take 500 mcg by mouth daily.     No current facility-administered medications on file prior to visit.     PAST MEDICAL HISTORY: Past Medical History:  Diagnosis Date  . Allergies   . Asthma   . Back pain   . Breast nodule 02/04/2014  . Constipation   . Constipation   . Depression   . Dizziness   . Dry skin   . Easy bruising   . Encounter for other  contraceptive management 10/21/2014  . Enlarged thyroid 09/27/2012  . Fatigue   . Fatty liver   . Fibromyalgia   . Floaters in visual field   . Glands swollen   . Hashimoto's disease   . Headache   . Heat intolerance   . Hidradenitis   . High cholesterol   . Hoarseness   . Hypertension   . Hypothyroidism   . IBS (irritable bowel syndrome)   . Joint pain   . Leg cramping   . Migraine headache   . Muscle stiffness   . Myalgia   . Nausea   . PMS (premenstrual syndrome) 09/27/2012  . Stress   . Swelling of lower extremity   . Tachycardia    patient states she has long history of resting tachycardia  . Thyroid nodule   . Uterine prolapse 10/21/2014  . Vaginal irritation 10/08/2015  . Vision changes   . Wheezing   . Yeast infection 10/08/2015    PAST SURGICAL HISTORY: History reviewed. No pertinent surgical history.  SOCIAL HISTORY: Social History   Tobacco Use  . Smoking status: Never Smoker  . Smokeless tobacco: Never Used  Substance Use Topics  . Alcohol use: No  . Drug use: No    FAMILY HISTORY: Family History  Problem Relation Age of Onset  . Diabetes Mother   . Hypertension Mother   . Fibromyalgia Mother   . Obesity Mother   . Heart disease Father   . Hypertension Father   . Hyperlipidemia Father   . Obesity Father   . Sleep apnea Father   . Hypertension Maternal Grandmother   . Vision loss Maternal Grandmother   . Glaucoma Maternal Grandmother   . Stroke Maternal Grandmother   . Cancer Maternal Grandfather        melonoma  . Heart disease Paternal Grandmother   . Hypertension Paternal Grandmother   . Hyperlipidemia Paternal Grandmother   . Other Paternal Grandmother        macular degeneration  . Cancer Paternal Grandfather        lung  . Cancer Maternal Uncle        lung  . Colon cancer Maternal Uncle   . Cancer Paternal Aunt        breast    ROS: Review of Systems  Constitutional: Negative for weight loss.  Cardiovascular: Negative for  chest pain.  Gastrointestinal:  Positive for nausea.  Musculoskeletal: Negative for myalgias.  Neurological: Positive for dizziness.  Psychiatric/Behavioral: Positive for depression. Negative for suicidal ideas.    PHYSICAL EXAM: Pt in no acute distress  RECENT LABS AND TESTS: BMET    Component Value Date/Time   NA 140 05/15/2018 1203   K 3.5 05/15/2018 1203   CL 99 05/15/2018 1203   CO2 23 05/15/2018 1203   GLUCOSE 83 05/15/2018 1203   GLUCOSE 81 10/17/2013 0901   BUN 7 05/15/2018 1203   CREATININE 0.61 05/15/2018 1203   CREATININE 0.55 10/17/2013 0901   CALCIUM 9.3 05/15/2018 1203   GFRNONAA 119 05/15/2018 1203   GFRAA 138 05/15/2018 1203   Lab Results  Component Value Date   HGBA1C 5.4 05/15/2018   HGBA1C 5.2 04/24/2015   HGBA1C 5.4 10/02/2012   Lab Results  Component Value Date   INSULIN 13.7 05/15/2018   CBC    Component Value Date/Time   WBC 12.3 (H) 10/27/2016 1653   WBC 6.7 10/17/2013 0901   RBC 4.69 10/27/2016 1653   RBC 4.67 10/17/2013 0901   HGB 13.3 10/27/2016 1653   HCT 40.5 10/27/2016 1653   PLT 434 (H) 10/27/2016 1653   MCV 86 10/27/2016 1653   MCH 28.4 10/27/2016 1653   MCH 29.1 10/17/2013 0901   MCHC 32.8 10/27/2016 1653   MCHC 35.1 10/17/2013 0901   RDW 13.1 10/27/2016 1653   Iron/TIBC/Ferritin/ %Sat No results found for: IRON, TIBC, FERRITIN, IRONPCTSAT Lipid Panel     Component Value Date/Time   CHOL 252 (H) 02/16/2018 0812   TRIG 100 02/16/2018 0812   HDL 43 02/16/2018 0812   CHOLHDL 5.9 (H) 02/16/2018 0812   CHOLHDL 4.4 10/17/2013 0901   VLDL 16 10/17/2013 0901   LDLCALC 189 (H) 02/16/2018 0812   Hepatic Function Panel     Component Value Date/Time   PROT 7.1 05/15/2018 1203   ALBUMIN 4.5 05/15/2018 1203   AST 24 05/15/2018 1203   ALT 28 05/15/2018 1203   ALKPHOS 140 (H) 05/15/2018 1203   BILITOT 0.7 05/15/2018 1203   BILIDIR 0.14 02/16/2018 0812      Component Value Date/Time   TSH 2.110 05/15/2018 1203   TSH 2.690  02/16/2018 0953   TSH 1.780 09/27/2017 0912     Ref. Range 05/15/2018 12:03  Vitamin D, 25-Hydroxy Latest Ref Range: 30.0 - 100.0 ng/mL 52.5    I, [place] Crane, am acting as Energy manager for [employer]. Manson Passey, DO  I have reviewed the above documentation for accuracy and completeness, and I agree with the above. -Corinna Capra, DO

## 2018-09-11 ENCOUNTER — Encounter (INDEPENDENT_AMBULATORY_CARE_PROVIDER_SITE_OTHER): Payer: Self-pay | Admitting: Bariatrics

## 2018-09-12 ENCOUNTER — Ambulatory Visit (INDEPENDENT_AMBULATORY_CARE_PROVIDER_SITE_OTHER): Payer: No Typology Code available for payment source | Admitting: Bariatrics

## 2018-09-18 ENCOUNTER — Other Ambulatory Visit: Payer: Self-pay

## 2018-09-18 ENCOUNTER — Encounter (INDEPENDENT_AMBULATORY_CARE_PROVIDER_SITE_OTHER): Payer: Self-pay | Admitting: Bariatrics

## 2018-09-18 ENCOUNTER — Ambulatory Visit (INDEPENDENT_AMBULATORY_CARE_PROVIDER_SITE_OTHER): Payer: No Typology Code available for payment source | Admitting: Bariatrics

## 2018-09-18 DIAGNOSIS — E8881 Metabolic syndrome: Secondary | ICD-10-CM | POA: Diagnosis not present

## 2018-09-18 DIAGNOSIS — F3289 Other specified depressive episodes: Secondary | ICD-10-CM

## 2018-09-18 DIAGNOSIS — I10 Essential (primary) hypertension: Secondary | ICD-10-CM

## 2018-09-18 DIAGNOSIS — Z6835 Body mass index (BMI) 35.0-35.9, adult: Secondary | ICD-10-CM

## 2018-09-19 ENCOUNTER — Encounter (INDEPENDENT_AMBULATORY_CARE_PROVIDER_SITE_OTHER): Payer: Self-pay | Admitting: Bariatrics

## 2018-09-19 LAB — COMPREHENSIVE METABOLIC PANEL
ALT: 32 IU/L (ref 0–32)
AST: 24 IU/L (ref 0–40)
Albumin/Globulin Ratio: 1.8 (ref 1.2–2.2)
Albumin: 4.3 g/dL (ref 3.8–4.8)
Alkaline Phosphatase: 138 IU/L — ABNORMAL HIGH (ref 39–117)
BUN/Creatinine Ratio: 16 (ref 9–23)
BUN: 10 mg/dL (ref 6–20)
Bilirubin Total: 0.6 mg/dL (ref 0.0–1.2)
CO2: 22 mmol/L (ref 20–29)
Calcium: 9 mg/dL (ref 8.7–10.2)
Chloride: 103 mmol/L (ref 96–106)
Creatinine, Ser: 0.64 mg/dL (ref 0.57–1.00)
GFR calc Af Amer: 135 mL/min/{1.73_m2} (ref >59)
GFR calc non Af Amer: 117 mL/min/{1.73_m2} (ref >59)
Globulin, Total: 2.4 g/dL (ref 1.5–4.5)
Glucose: 88 mg/dL (ref 65–99)
Potassium: 4.1 mmol/L (ref 3.5–5.2)
Sodium: 139 mmol/L (ref 134–144)
Total Protein: 6.7 g/dL (ref 6.0–8.5)

## 2018-09-19 LAB — T4, FREE: Free T4: 1.04 ng/dL (ref 0.82–1.77)

## 2018-09-19 LAB — T3: T3, Total: 131 ng/dL (ref 71–180)

## 2018-09-19 LAB — HEMOGLOBIN A1C
Est. average glucose Bld gHb Est-mCnc: 111 mg/dL
Hgb A1c MFr Bld: 5.5 % (ref 4.8–5.6)

## 2018-09-19 LAB — VITAMIN D 25 HYDROXY (VIT D DEFICIENCY, FRACTURES): Vit D, 25-Hydroxy: 79.2 ng/mL (ref 30.0–100.0)

## 2018-09-19 LAB — TSH: TSH: 2.48 u[IU]/mL (ref 0.450–4.500)

## 2018-09-19 LAB — INSULIN, RANDOM: INSULIN: 17 u[IU]/mL (ref 2.6–24.9)

## 2018-09-19 NOTE — Progress Notes (Signed)
Office: [phone]  /  Fax: [phone] TeleHealth Visit:  Hailey Holden has verbally consented to this TeleHealth visit today. The patient is located at work, the provider is located at the [facility] office. The participants in this visit include the listed provider and patient. The visit was conducted today via FaceTime.  HPI:   Chief Complaint: OBESITY Hailey Holden is here to discuss her progress with her obesity treatment plan. She is on the Category 3 plan and is following her eating plan approximately 30-40% of the time. She states she is exercising 0 minutes 0 times per week. Hailey Holden states that she has gained 2 lbs and reports she has not been as "focused." She lacks motivation, is stressed, and has been stress eating more. We were unable to weigh the patient today for this TeleHealth visit. She feels as if she has gained 2 lbs since her last visit. She has lost 0 lbs since starting treatment with Hailey Holden.  Insulin Resistance Hailey Holden has a diagnosis of insulin resistance based on her elevated fasting insulin level >5. Although Hailey Holden's blood glucose readings are still under good control, insulin resistance puts her at greater risk of metabolic syndrome and diabetes. She is not taking metformin currently and continues to work on diet and exercise to decrease risk of diabetes.  Hypertension Hailey Holden is a 33 y.o. female with hypertension and is taking HCTZ. Hailey Holden denies chest pain or shortness of breath on exertion. She is working weight loss to help control her blood pressure with the goal of decreasing her risk of heart attack and stroke. Hailey Holden blood pressure is well currently controlled.  Depression with emotional eating behaviors Hailey Holden is struggling with emotional eating and using food for comfort to the extent that it is negatively impacting her health. She often snacks when she is not hungry. Hailey Holden sometimes feels she is out of control and then feels  guilty that she made poor food choices. She has been working on behavior modification techniques to help reduce her emotional eating and has been somewhat successful. She shows no sign of suicidal or homicidal ideations.  [facility] 05/15/2018 11/02/2017 10/27/2016 06/19/2015 05/27/2015  Decreased Interest 1 0 0 0 0  Down, Depressed, Hopeless 1 0 0 0 0  PHQ - 2 Score 2 0 0 0 0  Altered sleeping 1 - - - -  Tired, decreased energy 2 - - - -  Change in appetite 2 - - - -  Feeling bad or failure about yourself  1 - - - -  Trouble concentrating 2 - - - -  Moving slowly or fidgety/restless 0 - - - -  Suicidal thoughts 0 - - - -  PHQ-9 Score 10 - - - -  Difficult doing work/chores Not difficult at all - - - -   ASSESSMENT AND PLAN:  Insulin resistance  Essential hypertension  Other depression  Class 2 severe obesity with serious comorbidity and body mass index (BMI) of 35.0 to 35.9 in adult, unspecified obesity type (HCC)  PLAN:  Insulin Resistance Hailey Holden continue to work on weight loss, exercise, and decreasing simple carbohydrates in her diet to help decrease the risk of diabetes. We dicussed metformin including benefits and risks. She was informed that eating too many simple carbohydrates or too many calories at one sitting increases the likelihood of GI side effects. Hailey Holden decrease carbohydrates, increase protein, and Holden increase her activities. She Holden Holden with Hailey Holden as  directed to monitor her progress.  Hypertension We discussed sodium restriction, working on healthy weight loss, and a regular exercise program as the means to achieve improved blood pressure control. Hailey Holden agreed with this plan and agreed to follow up as directed. We Holden continue to monitor her blood pressure as well as her progress with the above lifestyle modifications. She Holden continue her medications as prescribed and Holden watch for signs of hypotension as she continues her lifestyle  modifications.  Depression with Emotional Eating Behaviors We discussed behavior modification techniques today to help Hailey Holden deal with her emotional eating and depression. Hailey Holden Holden resume Wellbutrin (has prescription at home). If she cannot take Wellbutrin, we may consider other options. We did discuss Contrave.   Obesity Hailey Holden is currently in the action stage of change. As such, her goal is to continue with weight loss efforts. She has agreed to follow the Category 3 plan. Hailey Holden Holden weigh herself at home and record. She Holden also work on meal planning. Hailey Holden has been instructed to work up to a goal of 150 minutes of combined cardio and strengthening exercise per week for weight loss and overall health benefits. We discussed the following Behavioral Modification Strategies today: increasing lean protein intake, decreasing simple carbohydrates, increasing vegetables, increase H20 intake, decrease eating out, no skipping meals, work on meal planning and easy cooking plans, and keeping healthy foods in the home.  Hailey Holden with our clinic in 2 weeks. She was informed of the importance of frequent Holden visits to maximize her success with intensive lifestyle modifications for her multiple health conditions.  ALLERGIES: Allergies  Allergen Reactions  . Clindamycin/Lincomycin Shortness Of Breath and Rash  . Tdap [Tetanus-Diphth-Acell Pertussis] Itching    Itching, swelling in the arm, chest tightness, wheezing, headache, SOB  . Latex   . Penicillins     MEDICATIONS: Current Outpatient Medications on File Prior to Visit  Medication Sig Dispense Refill  . acetaminophen (TYLENOL) 500 MG tablet Take 500 mg by mouth every 6 (six) hours as needed.    Marland Kitchen albuterol (VENTOLIN HFA) 108 (90 Base) MCG/ACT inhaler INHALE 2 PUFFS BY MOUTH EVERY 4 HOURS AS NEEDED FOR WHEEZING 18 g 6  . amLODipine (NORVASC) 10 MG tablet TAKE 1 TABLET (10 MG TOTAL) BY MOUTH DAILY. 90 tablet 3   . cetirizine (ZYRTEC) 10 MG tablet Take 10 mg by mouth daily.    . cholecalciferol (VITAMIN D) 1000 units tablet Take 5,000 Units by mouth daily.     . DULoxetine (CYMBALTA) 60 MG capsule TAKE 1 CAPSULE BY MOUTH DAILY. 90 capsule 0  . etodolac (LODINE) 400 MG tablet Take 1 tablet (400 mg total) by mouth 2 (two) times daily. 28 tablet 2  . fluticasone (FLOVENT HFA) 44 MCG/ACT inhaler 2 puffs bid 1 Inhaler 5  . hydrochlorothiazide (MICROZIDE) 12.5 MG capsule Take 1 capsule (12.5 mg total) by mouth daily. 90 capsule 4  . ibuprofen (ADVIL,MOTRIN) 200 MG tablet Take 200 mg by mouth every 6 (six) hours as needed.    Marland Kitchen levothyroxine (SYNTHROID, LEVOTHROID) 25 MCG tablet Take 1 tablet (25 mcg total) by mouth daily before breakfast. 90 tablet 3  . montelukast (SINGULAIR) 10 MG tablet Take 1 tablet (10 mg total) by mouth at bedtime. 90 tablet 1  . Naproxen Sodium (ALEVE PO) Take by mouth as needed.    . predniSONE (DELTASONE) 20 MG tablet Take 3 tablets for 2 days, then 2 tablets for 3 days then one tablet for  3 days 15 tablet 0  . tiZANidine (ZANAFLEX) 4 MG tablet One p o up to tid prn pain 30 tablet 1  . triamcinolone cream (KENALOG) 0.1 % Apply twice a day to affected area 60 g 1  . valACYclovir (VALTREX) 1000 MG tablet Take 1,000 mg by mouth once as needed.    . vitamin B-12 (CYANOCOBALAMIN) 500 MCG tablet Take 500 mcg by mouth daily.     No current facility-administered medications on file prior to visit.     PAST MEDICAL HISTORY: Past Medical History:  Diagnosis Date  . Allergies   . Asthma   . Back pain   . Breast nodule 02/04/2014  . Constipation   . Constipation   . Depression   . Dizziness   . Dry skin   . Easy bruising   . Encounter for other contraceptive management 10/21/2014  . Enlarged thyroid 09/27/2012  . Fatigue   . Fatty liver   . Fibromyalgia   . Floaters in visual field   . Glands swollen   . Hashimoto's disease   . Headache   . Heat intolerance   . Hidradenitis    . High cholesterol   . Hoarseness   . Hypertension   . Hypothyroidism   . IBS (irritable bowel syndrome)   . Joint pain   . Leg cramping   . Migraine headache   . Muscle stiffness   . Myalgia   . Nausea   . PMS (premenstrual syndrome) 09/27/2012  . Stress   . Swelling of lower extremity   . Tachycardia    patient states she has long history of resting tachycardia  . Thyroid nodule   . Uterine prolapse 10/21/2014  . Vaginal irritation 10/08/2015  . Vision changes   . Wheezing   . Yeast infection 10/08/2015    PAST SURGICAL HISTORY: History reviewed. No pertinent surgical history.  SOCIAL HISTORY: Social History   Tobacco Use  . Smoking status: Never Smoker  . Smokeless tobacco: Never Used  Substance Use Topics  . Alcohol use: No  . Drug use: No    FAMILY HISTORY: Family History  Problem Relation Age of Onset  . Diabetes Mother   . Hypertension Mother   . Fibromyalgia Mother   . Obesity Mother   . Heart disease Father   . Hypertension Father   . Hyperlipidemia Father   . Obesity Father   . Sleep apnea Father   . Hypertension Maternal Grandmother   . Vision loss Maternal Grandmother   . Glaucoma Maternal Grandmother   . Stroke Maternal Grandmother   . Cancer Maternal Grandfather        melonoma  . Heart disease Paternal Grandmother   . Hypertension Paternal Grandmother   . Hyperlipidemia Paternal Grandmother   . Other Paternal Grandmother        macular degeneration  . Cancer Paternal Grandfather        lung  . Cancer Maternal Uncle        lung  . Colon cancer Maternal Uncle   . Cancer Paternal Aunt        breast   ROS: Review of Systems  Respiratory: Negative for shortness of breath.   Cardiovascular: Negative for chest pain.   PHYSICAL EXAM: Pt in no acute distress  RECENT LABS AND TESTS: BMET    Component Value Date/Time   NA 139 09/18/2018 0753   K 4.1 09/18/2018 0753   CL 103 09/18/2018 0753   CO2 22 09/18/2018 0753  GLUCOSE 88  09/18/2018 0753   GLUCOSE 81 10/17/2013 0901   BUN 10 09/18/2018 0753   CREATININE 0.64 09/18/2018 0753   CREATININE 0.55 10/17/2013 0901   CALCIUM 9.0 09/18/2018 0753   GFRNONAA 117 09/18/2018 0753   GFRAA 135 09/18/2018 0753   Lab Results  Component Value Date   HGBA1C 5.5 09/18/2018   HGBA1C 5.4 05/15/2018   HGBA1C 5.2 04/24/2015   HGBA1C 5.4 10/02/2012   Lab Results  Component Value Date   INSULIN 17.0 09/18/2018   INSULIN 13.7 05/15/2018   CBC    Component Value Date/Time   WBC 12.3 (H) 10/27/2016 1653   WBC 6.7 10/17/2013 0901   RBC 4.69 10/27/2016 1653   RBC 4.67 10/17/2013 0901   HGB 13.3 10/27/2016 1653   HCT 40.5 10/27/2016 1653   PLT 434 (H) 10/27/2016 1653   MCV 86 10/27/2016 1653   MCH 28.4 10/27/2016 1653   MCH 29.1 10/17/2013 0901   MCHC 32.8 10/27/2016 1653   MCHC 35.1 10/17/2013 0901   RDW 13.1 10/27/2016 1653   Iron/TIBC/Ferritin/ %Sat No results found for: IRON, TIBC, FERRITIN, IRONPCTSAT Lipid Panel     Component Value Date/Time   CHOL 252 (H) 02/16/2018 0812   TRIG 100 02/16/2018 0812   HDL 43 02/16/2018 0812   CHOLHDL 5.9 (H) 02/16/2018 0812   CHOLHDL 4.4 10/17/2013 0901   VLDL 16 10/17/2013 0901   LDLCALC 189 (H) 02/16/2018 0812   Hepatic Function Panel     Component Value Date/Time   PROT 6.7 09/18/2018 0753   ALBUMIN 4.3 09/18/2018 0753   AST 24 09/18/2018 0753   ALT 32 09/18/2018 0753   ALKPHOS 138 (H) 09/18/2018 0753   BILITOT 0.6 09/18/2018 0753   BILIDIR 0.14 02/16/2018 0812      Component Value Date/Time   TSH 2.480 09/18/2018 0753   TSH 2.110 05/15/2018 1203   TSH 2.690 02/16/2018 0953   Results for Hailey ChiquitoRAVIS, Norabelle M (MRN [id number]) as of 09/19/2018 08:22  Ref. Range 09/18/2018 07:53  Vitamin D, 25-Hydroxy Latest Ref Range: 30.0 - 100.0 ng/mL 79.2    I, Marianna Paymentenise Haag, am acting as Energy managertranscriptionist for Chesape[employer] Energyngel Brown, DO  I have reviewed the above documentation for accuracy and completeness, and I agree with the above.  -Corinna CapraAngel Brown, DO

## 2018-10-02 ENCOUNTER — Other Ambulatory Visit: Payer: Self-pay

## 2018-10-02 ENCOUNTER — Telehealth (INDEPENDENT_AMBULATORY_CARE_PROVIDER_SITE_OTHER): Payer: No Typology Code available for payment source | Admitting: Bariatrics

## 2018-10-02 ENCOUNTER — Encounter (INDEPENDENT_AMBULATORY_CARE_PROVIDER_SITE_OTHER): Payer: Self-pay | Admitting: Bariatrics

## 2018-10-02 DIAGNOSIS — E8881 Metabolic syndrome: Secondary | ICD-10-CM

## 2018-10-02 DIAGNOSIS — I10 Essential (primary) hypertension: Secondary | ICD-10-CM

## 2018-10-02 DIAGNOSIS — Z6835 Body mass index (BMI) 35.0-35.9, adult: Secondary | ICD-10-CM

## 2018-10-02 DIAGNOSIS — F3289 Other specified depressive episodes: Secondary | ICD-10-CM | POA: Diagnosis not present

## 2018-10-02 MED ORDER — BUPROPION HCL ER (SR) 150 MG PO TB12: 150.0000 mg | ORAL_TABLET | Freq: Every day | ORAL | 0 refills | Status: DC

## 2018-10-02 MED FILL — BUPROPION HCL SR 150 MG TAB: 150 | 30 days supply | Qty: 30 | Fill #0

## 2018-10-03 NOTE — Progress Notes (Signed)
Office: [phone]  /  Fax: [phone] TeleHealth Visit:  Hailey Holden has verbally consented to this TeleHealth visit today. The patient is located at work, the provider is located at the [facility] office. The participants in this visit include the listed provider and patient and any and all parties involved. The visit was conducted today via FaceTime.  HPI:   Chief Complaint: OBESITY Hailey Holden is here to discuss her progress with her obesity treatment plan. She is on the Category 3 plan and is following her eating plan approximately 50 % of the time. She states she is gardening for exercise. Hailey Holden states that her weight has stayed the same. Her appetite is less than normal. She still struggles with water. We were unable to weigh the patient today for this TeleHealth visit. She feels as if she has maintained weight since her last visit. She has lost 0 lbs since starting treatment with us.  Insulin Resistance Hailey Holden has a diagnosis of insulin resistance based on her elevated fasting insulin level >5. Although Tekeshia's blood glucose readings are still under good control, insulin resistance puts her at greater risk of metabolic syndrome and diabetes. She is not taking metformin currently and continues to work on diet and exercise to decrease risk of diabetes. Hailey Holden denies polyphagia.  Hypertension Hailey Holden is a 33 y.o. female with hypertension. She is taking Norvasc and HCTZ. Hailey Holden denies chest pain or shortness of breath on exertion. She is working weight loss to help control her blood pressure with the goal of decreasing her risk of heart attack and stroke. Hailey Holden blood pressure is well controlled.  ASSESSMENT AND PLAN:  Insulin resistance  Essential hypertension  Other depression  Class 2 severe obesity with serious comorbidity and body mass index (BMI) of 35.0 to 35.9 in adult, unspecified obesity type (HCC)  PLAN:  Insulin Resistance  Hailey Holden will continue to work on weight loss, increasing activity, increasing lean protein and decreasing simple carbohydrates in her diet to help decrease the risk of diabetes. She was informed that eating too many simple carbohydrates or too many calories at one sitting increases the likelihood of GI side effects. Hailey Holden agreed to follow up with us as directed to monitor her progress.  Hypertension We discussed sodium restriction, working on healthy weight loss, and a regular exercise program as the means to achieve improved blood pressure control. Hailey Holden agreed with this plan and agreed to follow up as directed. We will continue to monitor her blood pressure as well as her progress with the above lifestyle modifications. She will continue her medications as prescribed and will watch for signs of hypotension as she continues her lifestyle modifications.  Obesity Hailey Holden is currently in the action stage of change. As such, her goal is to continue with weight loss efforts She has agreed to follow the Category 3 plan Hailey Holden will continue gardening and has stayed active for weight loss and overall health benefits. We discussed the following Behavioral Modification Strategies today: planning for success, increase H2O intake, no skipping meals, keeping healthy foods in the home, increasing lean protein intake, decreasing simple carbohydrates, increasing vegetables, decrease eating out and work on meal planning and easy cooking plans Hailey Holden will weigh herself at home until she returns to the office. She will increase protein and eat her protein first.  Hailey Holden has agreed to follow up with our clinic in 2 weeks. She was informed of the importance of frequent follow up visits to maximize  her success with intensive lifestyle modifications for her multiple health conditions.  ALLERGIES: Allergies  Allergen Reactions  . Clindamycin/Lincomycin Shortness Of Breath and Rash  . Tdap [Tetanus-Diphth-Acell  Pertussis] Itching    Itching, swelling in the arm, chest tightness, wheezing, headache, SOB  . Latex   . Penicillins     MEDICATIONS: Current Outpatient Medications on File Prior to Visit  Medication Sig Dispense Refill  . albuterol (VENTOLIN HFA) 108 (90 Base) MCG/ACT inhaler INHALE 2 PUFFS BY MOUTH EVERY 4 HOURS AS NEEDED FOR WHEEZING 18 g 6  . amLODipine (NORVASC) 10 MG tablet TAKE 1 TABLET (10 MG TOTAL) BY MOUTH DAILY. 90 tablet 3  . cetirizine (ZYRTEC) 10 MG tablet Take 10 mg by mouth daily.    . cholecalciferol (VITAMIN D) 1000 units tablet Take 5,000 Units by mouth daily.     . DULoxetine (CYMBALTA) 60 MG capsule TAKE 1 CAPSULE BY MOUTH DAILY. 90 capsule 0  . fluticasone (FLOVENT HFA) 44 MCG/ACT inhaler 2 puffs bid 1 Inhaler 5  . hydrochlorothiazide (MICROZIDE) 12.5 MG capsule Take 1 capsule (12.5 mg total) by mouth daily. 90 capsule 4  . ibuprofen (ADVIL,MOTRIN) 200 MG tablet Take 200 mg by mouth every 6 (six) hours as needed.    Marland Kitchen. levothyroxine (SYNTHROID, LEVOTHROID) 25 MCG tablet Take 1 tablet (25 mcg total) by mouth daily before breakfast. 90 tablet 3  . montelukast (SINGULAIR) 10 MG tablet Take 1 tablet (10 mg total) by mouth at bedtime. 90 tablet 1  . valACYclovir (VALTREX) 1000 MG tablet Take 1,000 mg by mouth once as needed.    . vitamin B-12 (CYANOCOBALAMIN) 500 MCG tablet Take 500 mcg by mouth daily.     No current facility-administered medications on file prior to visit.     PAST MEDICAL HISTORY: Past Medical History:  Diagnosis Date  . Allergies   . Asthma   . Back pain   . Breast nodule 02/04/2014  . Constipation   . Constipation   . Depression   . Dizziness   . Dry skin   . Easy bruising   . Encounter for other contraceptive management 10/21/2014  . Enlarged thyroid 09/27/2012  . Fatigue   . Fatty liver   . Fibromyalgia   . Floaters in visual field   . Glands swollen   . Hashimoto's disease   . Headache   . Heat intolerance   . Hidradenitis   .  High cholesterol   . Hoarseness   . Hypertension   . Hypothyroidism   . IBS (irritable bowel syndrome)   . Joint pain   . Leg cramping   . Migraine headache   . Muscle stiffness   . Myalgia   . Nausea   . PMS (premenstrual syndrome) 09/27/2012  . Stress   . Swelling of lower extremity   . Tachycardia    patient states she has long history of resting tachycardia  . Thyroid nodule   . Uterine prolapse 10/21/2014  . Vaginal irritation 10/08/2015  . Vision changes   . Wheezing   . Yeast infection 10/08/2015    PAST SURGICAL HISTORY: History reviewed. No pertinent surgical history.  SOCIAL HISTORY: Social History   Tobacco Use  . Smoking status: Never Smoker  . Smokeless tobacco: Never Used  Substance Use Topics  . Alcohol use: No  . Drug use: No    FAMILY HISTORY: Family History  Problem Relation Age of Onset  . Diabetes Mother   . Hypertension Mother   .  Fibromyalgia Mother   . Obesity Mother   . Heart disease Father   . Hypertension Father   . Hyperlipidemia Father   . Obesity Father   . Sleep apnea Father   . Hypertension Maternal Grandmother   . Vision loss Maternal Grandmother   . Glaucoma Maternal Grandmother   . Stroke Maternal Grandmother   . Cancer Maternal Grandfather        melonoma  . Heart disease Paternal Grandmother   . Hypertension Paternal Grandmother   . Hyperlipidemia Paternal Grandmother   . Other Paternal Grandmother        macular degeneration  . Cancer Paternal Grandfather        lung  . Cancer Maternal Uncle        lung  . Colon cancer Maternal Uncle   . Cancer Paternal Aunt        breast    ROS: Review of Systems  Constitutional: Negative for weight loss.  Respiratory: Negative for shortness of breath (on exertion).   Cardiovascular: Negative for chest pain.  Endo/Heme/Allergies:       Negative for polyphagia    PHYSICAL EXAM: Pt in no acute distress  RECENT LABS AND TESTS: BMET    Component Value Date/Time   NA 139  09/18/2018 0753   K 4.1 09/18/2018 0753   CL 103 09/18/2018 0753   CO2 22 09/18/2018 0753   GLUCOSE 88 09/18/2018 0753   GLUCOSE 81 10/17/2013 0901   BUN 10 09/18/2018 0753   CREATININE 0.64 09/18/2018 0753   CREATININE 0.55 10/17/2013 0901   CALCIUM 9.0 09/18/2018 0753   GFRNONAA 117 09/18/2018 0753   GFRAA 135 09/18/2018 0753   Lab Results  Component Value Date   HGBA1C 5.5 09/18/2018   HGBA1C 5.4 05/15/2018   HGBA1C 5.2 04/24/2015   HGBA1C 5.4 10/02/2012   Lab Results  Component Value Date   INSULIN 17.0 09/18/2018   INSULIN 13.7 05/15/2018   CBC    Component Value Date/Time   WBC 12.3 (H) 10/27/2016 1653   WBC 6.7 10/17/2013 0901   RBC 4.69 10/27/2016 1653   RBC 4.67 10/17/2013 0901   HGB 13.3 10/27/2016 1653   HCT 40.5 10/27/2016 1653   PLT 434 (H) 10/27/2016 1653   MCV 86 10/27/2016 1653   MCH 28.4 10/27/2016 1653   MCH 29.1 10/17/2013 0901   MCHC 32.8 10/27/2016 1653   MCHC 35.1 10/17/2013 0901   RDW 13.1 10/27/2016 1653   Iron/TIBC/Ferritin/ %Sat No results found for: IRON, TIBC, FERRITIN, IRONPCTSAT Lipid Panel     Component Value Date/Time   CHOL 252 (H) 02/16/2018 0812   TRIG 100 02/16/2018 0812   HDL 43 02/16/2018 0812   CHOLHDL 5.9 (H) 02/16/2018 0812   CHOLHDL 4.4 10/17/2013 0901   VLDL 16 10/17/2013 0901   LDLCALC 189 (H) 02/16/2018 0812   Hepatic Function Panel     Component Value Date/Time   PROT 6.7 09/18/2018 0753   ALBUMIN 4.3 09/18/2018 0753   AST 24 09/18/2018 0753   ALT 32 09/18/2018 0753   ALKPHOS 138 (H) 09/18/2018 0753   BILITOT 0.6 09/18/2018 0753   BILIDIR 0.14 02/16/2018 0812      Component Value Date/Time   TSH 2.480 09/18/2018 0753   TSH 2.110 05/15/2018 1203   TSH 2.690 02/16/2018 0953     Ref. Range 09/18/2018 07:53  Vitamin D, 25-Hydroxy Latest Ref Range: 30.0 - 100.0 ng/mL 79.2    I, [place] CraneJoanne Murray, am acting as Energy managertranscriptionist for   A. Owens Shark, DO  I have reviewed the above documentation for accuracy  and completeness, and I agree with the above. -Jearld Lesch, DO

## 2018-10-11 ENCOUNTER — Encounter (INDEPENDENT_AMBULATORY_CARE_PROVIDER_SITE_OTHER): Payer: Self-pay

## 2018-10-16 ENCOUNTER — Telehealth (INDEPENDENT_AMBULATORY_CARE_PROVIDER_SITE_OTHER): Payer: No Typology Code available for payment source | Admitting: Bariatrics

## 2018-10-16 ENCOUNTER — Encounter (INDEPENDENT_AMBULATORY_CARE_PROVIDER_SITE_OTHER): Payer: Self-pay | Admitting: Bariatrics

## 2018-10-16 ENCOUNTER — Other Ambulatory Visit: Payer: Self-pay | Admitting: Family Medicine

## 2018-10-16 ENCOUNTER — Other Ambulatory Visit: Payer: Self-pay

## 2018-10-16 DIAGNOSIS — E8881 Metabolic syndrome: Secondary | ICD-10-CM

## 2018-10-16 DIAGNOSIS — F3289 Other specified depressive episodes: Secondary | ICD-10-CM | POA: Diagnosis not present

## 2018-10-16 DIAGNOSIS — Z6835 Body mass index (BMI) 35.0-35.9, adult: Secondary | ICD-10-CM

## 2018-10-16 MED ORDER — BUPROPION HCL ER (SR) 150 MG PO TB12: 150.0000 mg | ORAL_TABLET | Freq: Every day | ORAL | 0 refills | Status: DC

## 2018-10-16 MED FILL — DULoxetine HCL 60 MG CPEP: 60 | 90 days supply | Qty: 90 | Fill #0

## 2018-10-16 MED FILL — MONTELUKAST SOD 10 MG TAB: 10 | 90 days supply | Qty: 90 | Fill #0

## 2018-10-16 MED FILL — AMLODIPINE BESYLATE 10 MG T: 10 | 90 days supply | Qty: 90 | Fill #0

## 2018-10-17 ENCOUNTER — Encounter (INDEPENDENT_AMBULATORY_CARE_PROVIDER_SITE_OTHER): Payer: Self-pay

## 2018-10-17 NOTE — Progress Notes (Signed)
Office: [phone]  /  Fax: [phone] TeleHealth Visit:  Hailey Holden has verbally consented to this TeleHealth visit today. The patient is located at work, the provider is located at the [employer] [facility] office. The participants in this visit include the listed provider and patient. The visit was conducted today via FaceTime.  HPI:   Chief Complaint: OBESITY Hailey Holden is here to discuss her progress with her obesity treatment plan. She is on the Category 3 plan and is following her eating plan approximately 0% of the time. She states she is doing yard work and working in the garden 30 minutes 3-4 times per week. Hailey Holden states that she has gained 2 lbs. She reports she did not follow the plan well and was drinking Dr. Malachi Bonds. We were unable to weigh the patient today for this TeleHealth visit. She feels as if she has gained 2 lbs since her last visit. She has lost 0 lbs since starting treatment with [name].  Depression with emotional eating behaviors Hailey Holden is struggling with emotional eating and using food for comfort to the extent that it is negatively impacting her health. She often snacks when she is not hungry. Hailey Holden sometimes feels she is out of control and then feels guilty that she made poor food choices. She has been working on behavior modification techniques to help reduce her emotional eating and has been somewhat successful. She shows no sign of suicidal or homicidal ideations.  Depression screen Hailey Holden 2/9 05/15/2018 11/02/2017 10/27/2016 06/19/2015 05/27/2015  Decreased Interest 1 0 0 0 0  Down, Depressed, Hopeless 1 0 0 0 0  PHQ - 2 Score 2 0 0 0 0  Altered sleeping 1 - - - -  Tired, decreased energy 2 - - - -  Change in appetite 2 - - - -  Feeling bad or failure about yourself  1 - - - -  Trouble concentrating 2 - - - -  Moving slowly or fidgety/restless 0 - - - -  Suicidal thoughts 0 - - - -  PHQ-9 Score 10 - - - -  Difficult doing work/chores Not difficult at all -  - - -   Insulin Resistance Hailey Holden has a diagnosis of insulin resistance based on her elevated fasting insulin level >5. Her last A1c was 5.5 on 09/18/2018 and her insulin was 17.0. Although Hailey Holden's blood glucose readings are still under good control, insulin resistance puts her at greater risk of metabolic syndrome and diabetes. She is on no medication currently and continues to work on diet and exercise to decrease risk of diabetes.  ASSESSMENT AND PLAN:  Other depression  Insulin resistance  Class 2 severe obesity with serious comorbidity and body mass index (BMI) of 35.0 to 35.9 in adult, unspecified obesity type (Hailey Holden)  PLAN:  Depression with Emotional Eating Behaviors We discussed behavior modification techniques today to help Hailey Holden deal with her emotional eating and depression. Hailey Holden was given a prescription for Wellbutrin 150 mg 1 PO a day #30 with 0 refills and agrees to follow-up with our clinic in 2 weeks.  Insulin Resistance Hailey Holden will continue to work on weight loss, exercise, and decreasing simple carbohydrates in her diet to help decrease the risk of diabetes. We dicussed metformin including benefits and risks. She was informed that eating too many simple carbohydrates or too many calories at one sitting increases the likelihood of GI side effects. Hailey Holden was instructed to increase her protein, decrease carbohydrates, increase activity, and follow-up with [name]  as directed to monitor her progress.  Obesity Hailey Holden is currently in the action stage of change. As such, her goal is to continue with weight loss efforts. She has agreed to follow the Category 3 plan. Hailey Holden will work on meal planning, intentional eating, increasing her water intake (Mio flavors for water).  Hailey Holden has been instructed to work up to a goal of 150 minutes of combined cardio and strengthening exercise per week for weight loss and overall health benefits. We discussed the following Behavioral Modification  Strategies today: increasing lean protein intake, decreasing simple carbohydrates, increasing vegetables, increase H20 intake, decrease eating out, no skipping meals, work on meal planning and easy cooking plans, and keeping healthy foods in the home.  Hailey Holden has agreed to follow up with our clinic in 2 weeks. She was informed of the importance of frequent follow up visits to maximize her success with intensive lifestyle modifications for her multiple health conditions.  ALLERGIES: Allergies  Allergen Reactions  . Clindamycin/Lincomycin Shortness Of Breath and Rash  . Tdap [Tetanus-Diphth-Acell Pertussis] Itching    Itching, swelling in the arm, chest tightness, wheezing, headache, SOB  . Latex   . Penicillins     MEDICATIONS: Current Outpatient Medications on File Prior to Visit  Medication Sig Dispense Refill  . albuterol (VENTOLIN HFA) 108 (90 Base) MCG/ACT inhaler INHALE 2 PUFFS BY MOUTH EVERY 4 HOURS AS NEEDED FOR WHEEZING 18 g 6  . amLODipine (NORVASC) 10 MG tablet TAKE 1 TABLET (10 MG TOTAL) BY MOUTH DAILY. 90 tablet 3  . cetirizine (ZYRTEC) 10 MG tablet Take 10 mg by mouth daily.    . cholecalciferol (VITAMIN D) 1000 units tablet Take 5,000 Units by mouth daily.     . fluticasone (FLOVENT HFA) 44 MCG/ACT inhaler 2 puffs bid 1 Inhaler 5  . hydrochlorothiazide (MICROZIDE) 12.5 MG capsule Take 1 capsule (12.5 mg total) by mouth daily. 90 capsule 4  . ibuprofen (ADVIL,MOTRIN) 200 MG tablet Take 200 mg by mouth every 6 (six) hours as needed.    Marland Kitchen. levothyroxine (SYNTHROID, LEVOTHROID) 25 MCG tablet Take 1 tablet (25 mcg total) by mouth daily before breakfast. 90 tablet 3  . montelukast (SINGULAIR) 10 MG tablet Take 1 tablet (10 mg total) by mouth at bedtime. 90 tablet 1  . valACYclovir (VALTREX) 1000 MG tablet Take 1,000 mg by mouth once as needed.    . vitamin B-12 (CYANOCOBALAMIN) 500 MCG tablet Take 500 mcg by mouth daily.     No current facility-administered medications on file  prior to visit.     PAST MEDICAL HISTORY: Past Medical History:  Diagnosis Date  . Allergies   . Asthma   . Back pain   . Breast nodule 02/04/2014  . Constipation   . Constipation   . Depression   . Dizziness   . Dry skin   . Easy bruising   . Encounter for other contraceptive management 10/21/2014  . Enlarged thyroid 09/27/2012  . Fatigue   . Fatty liver   . Fibromyalgia   . Floaters in visual field   . Glands swollen   . Hashimoto's disease   . Headache   . Heat intolerance   . Hidradenitis   . High cholesterol   . Hoarseness   . Hypertension   . Hypothyroidism   . IBS (irritable bowel syndrome)   . Joint pain   . Leg cramping   . Migraine headache   . Muscle stiffness   . Myalgia   . Nausea   .  PMS (premenstrual syndrome) 09/27/2012  . Stress   . Swelling of lower extremity   . Tachycardia    patient states she has long history of resting tachycardia  . Thyroid nodule   . Uterine prolapse 10/21/2014  . Vaginal irritation 10/08/2015  . Vision changes   . Wheezing   . Yeast infection 10/08/2015    PAST SURGICAL HISTORY: History reviewed. No pertinent surgical history.  SOCIAL HISTORY: Social History   Tobacco Use  . Smoking status: Never Smoker  . Smokeless tobacco: Never Used  Substance Use Topics  . Alcohol use: No  . Drug use: No    FAMILY HISTORY: Family History  Problem Relation Age of Onset  . Diabetes Mother   . Hypertension Mother   . Fibromyalgia Mother   . Obesity Mother   . Heart disease Father   . Hypertension Father   . Hyperlipidemia Father   . Obesity Father   . Sleep apnea Father   . Hypertension Maternal Grandmother   . Vision loss Maternal Grandmother   . Glaucoma Maternal Grandmother   . Stroke Maternal Grandmother   . Cancer Maternal Grandfather        melonoma  . Heart disease Paternal Grandmother   . Hypertension Paternal Grandmother   . Hyperlipidemia Paternal Grandmother   . Other Paternal Grandmother         macular degeneration  . Cancer Paternal Grandfather        lung  . Cancer Maternal Uncle        lung  . Colon cancer Maternal Uncle   . Cancer Paternal Aunt        breast   ROS: Review of Systems  Psychiatric/Behavioral: Positive for depression (emotional eating). Negative for suicidal ideas.       Negative for homicidal ideas.   PHYSICAL EXAM: Pt in no acute distress  RECENT LABS AND TESTS: BMET    Component Value Date/Time   NA 139 09/18/2018 0753   K 4.1 09/18/2018 0753   CL 103 09/18/2018 0753   CO2 22 09/18/2018 0753   GLUCOSE 88 09/18/2018 0753   GLUCOSE 81 10/17/2013 0901   BUN 10 09/18/2018 0753   CREATININE 0.64 09/18/2018 0753   CREATININE 0.55 10/17/2013 0901   CALCIUM 9.0 09/18/2018 0753   GFRNONAA 117 09/18/2018 0753   GFRAA 135 09/18/2018 0753   Lab Results  Component Value Date   HGBA1C 5.5 09/18/2018   HGBA1C 5.4 05/15/2018   HGBA1C 5.2 04/24/2015   HGBA1C 5.4 10/02/2012   Lab Results  Component Value Date   INSULIN 17.0 09/18/2018   INSULIN 13.7 05/15/2018   CBC    Component Value Date/Time   WBC 12.3 (H) 10/27/2016 1653   WBC 6.7 10/17/2013 0901   RBC 4.69 10/27/2016 1653   RBC 4.67 10/17/2013 0901   HGB 13.3 10/27/2016 1653   HCT 40.5 10/27/2016 1653   PLT 434 (H) 10/27/2016 1653   MCV 86 10/27/2016 1653   MCH 28.4 10/27/2016 1653   MCH 29.1 10/17/2013 0901   MCHC 32.8 10/27/2016 1653   MCHC 35.1 10/17/2013 0901   RDW 13.1 10/27/2016 1653   Iron/TIBC/Ferritin/ %Sat No results found for: IRON, TIBC, FERRITIN, IRONPCTSAT Lipid Panel     Component Value Date/Time   CHOL 252 (H) 02/16/2018 0812   TRIG 100 02/16/2018 0812   HDL 43 02/16/2018 0812   CHOLHDL 5.9 (H) 02/16/2018 0812   CHOLHDL 4.4 10/17/2013 0901   VLDL 16 10/17/2013 0901  LDLCALC 189 (H) 02/16/2018 0812   Hepatic Function Panel     Component Value Date/Time   PROT 6.7 09/18/2018 0753   ALBUMIN 4.3 09/18/2018 0753   AST 24 09/18/2018 0753   ALT 32 09/18/2018  0753   ALKPHOS 138 (H) 09/18/2018 0753   BILITOT 0.6 09/18/2018 0753   BILIDIR 0.14 02/16/2018 0812      Component Value Date/Time   TSH 2.480 09/18/2018 0753   TSH 2.110 05/15/2018 1203   TSH 2.690 02/16/2018 0953   Results for Richardson ChiquitoRAVIS, Cyan M (MRN [id number]) as of 10/17/2018 10:00  Ref. Range 09/18/2018 07:53  Vitamin D, 25-Hydroxy Latest Ref Range: 30.0 - 100.0 ng/mL 79.2    I, Marianna Paymentenise Haag, am acting as Energy managertranscriptionist for [employer]el Nolawi Kanady, DO  I have reviewed the above documentation for accuracy and completeness, and I agree with the above. -Corinna CapraAngel Dustie Brittle, DO

## 2018-10-30 ENCOUNTER — Encounter (INDEPENDENT_AMBULATORY_CARE_PROVIDER_SITE_OTHER): Payer: Self-pay

## 2018-10-31 ENCOUNTER — Encounter: Payer: Self-pay | Admitting: Adult Health

## 2018-10-31 ENCOUNTER — Ambulatory Visit (INDEPENDENT_AMBULATORY_CARE_PROVIDER_SITE_OTHER): Payer: No Typology Code available for payment source | Admitting: Adult Health

## 2018-10-31 ENCOUNTER — Other Ambulatory Visit: Payer: Self-pay

## 2018-10-31 VITALS — BP 119/83 | HR 104 | Ht 64.0 in | Wt 209.0 lb

## 2018-10-31 DIAGNOSIS — L732 Hidradenitis suppurativa: Secondary | ICD-10-CM

## 2018-10-31 MED ORDER — SULFAMETHOXAZOLE-TRIMETHOPRIM 800-160 MG PO TABS: 1.0000 | ORAL_TABLET | Freq: Two times a day (BID) | ORAL | 2 refills | Status: DC

## 2018-10-31 MED ORDER — SILVER SULFADIAZINE 1 % EX CREA: 1.0000 "application " | TOPICAL_CREAM | Freq: Two times a day (BID) | CUTANEOUS | 1 refills | Status: DC

## 2018-10-31 NOTE — Progress Notes (Signed)
Patient ID: Hailey Holden, female   DOB: 1985/07/26, 33 y.o.   MRN: [id number] History of Present Illness: Hailey Holden is a 33 year old white female, married, G3P2002, in for complaints of having flare of hidradenitis.She is being seen at [facility] and wellness center.  PCP is Dr Mickie Hillier.   Current Medications, Allergies, Past Medical History, Past Surgical History, Family History and Social History were reviewed in [employer] record.     Review of Systems:  Having flare if hidradenitis She has had reaction to sun screens recently     Physical Exam:BP 119/83 (BP Location: Left Arm, Patient Position: Sitting, Cuff Size: Large)   Pulse (!) 104   Ht 5\' 4"  (1.626 m)   Wt 209 lb (94.8 kg)   LMP 10/15/2018   BMI 35.87 kg/m  General:  Well developed, well nourished, no acute distress Skin:  Warm and dry Pelvic:  External genitalia is normal in appearance, she has hidradenitis bilateral upper thighs and lower abdomen  Psych:  No mood changes, alert and cooperative,seems happy Fall risk is low. Examination chaperoned by Levy Pupa LPN.   Impression: 1. Hidradenitis       Plan: Use antibacterial soap Will rx septra ds and slivadene  Meds ordered this encounter  Medications  . sulfamethoxazole-trimethoprim (BACTRIM DS) 800-160 MG tablet    Sig: Take 1 tablet by mouth 2 (two) times daily. Take 1 bid    Dispense:  28 tablet    Refill:  2    Order Specific Question:   Supervising Provider    Answer:   EURE, LUTHER H [[id number]]  . silver sulfADIAZINE (SILVADENE) 1 % cream    Sig: Apply 1 application topically 2 (two) times daily.    Dispense:  50 g    Refill:  1    Order Specific Question:   Supervising Provider    Answer:   Tania Ade H [[id number]]  Follow up in 3 months for physical

## 2018-11-01 ENCOUNTER — Encounter (INDEPENDENT_AMBULATORY_CARE_PROVIDER_SITE_OTHER): Payer: Self-pay | Admitting: Bariatrics

## 2018-11-01 ENCOUNTER — Ambulatory Visit (INDEPENDENT_AMBULATORY_CARE_PROVIDER_SITE_OTHER): Payer: No Typology Code available for payment source | Admitting: Bariatrics

## 2018-11-01 DIAGNOSIS — E8881 Metabolic syndrome: Secondary | ICD-10-CM | POA: Diagnosis not present

## 2018-11-01 DIAGNOSIS — Z6835 Body mass index (BMI) 35.0-35.9, adult: Secondary | ICD-10-CM | POA: Diagnosis not present

## 2018-11-01 DIAGNOSIS — F3289 Other specified depressive episodes: Secondary | ICD-10-CM | POA: Diagnosis not present

## 2018-11-05 ENCOUNTER — Encounter (INDEPENDENT_AMBULATORY_CARE_PROVIDER_SITE_OTHER): Payer: Self-pay | Admitting: Bariatrics

## 2018-11-05 NOTE — Progress Notes (Signed)
Office: [phone]  /  Fax: [phone] TeleHealth Visit:  [name] has verbally consented to this TeleHealth visit today. The patient is located at work, the provider is located at the [facility]. The participants in this visit include the listed provider and patient and any and all parties involved. The visit was conducted today via FaceTime.  HPI:   Chief Complaint: OBESITY Hailey Holden is here to discuss her progress with her obesity treatment plan. She is on the Category 3 plan and is following her eating plan approximately 100 % of the time. She states she is exercising 0 minutes 0 times per week. Hailey Holden states that she has lost 4 pounds (weight 204 lbs today). She has decreased stress eating and she is doing meal preparation. Hailey Holden is journaling her food and she is drinking more water. We were unable to weigh the patient today for this TeleHealth visit. She feels as if she has lost weight since her last visit. She has lost 0 lbs since starting treatment with us.  Insulin Resistance Hailey Holden has a diagnosis of insulin resistance based on her elevated fasting insulin level >5. Although Shyteria's blood glucose readings are still under good control, insulin resistance puts her at greater risk of metabolic syndrome and diabetes. She is not taking metformin currently and continues to work on diet and exercise to decrease risk of diabetes. Hailey Holden denies polyphagia.  Depression with emotional eating behaviors Hailey Holden was started on Wellbutrin at the last visit, and she denies any side effects. Hailey Holden has decreased stress eating. She struggles with emotional eating and using food for comfort to the extent that it is negatively impacting her health. She often snacks when she is not hungry. Hailey Holden sometimes feels she is out of control and then feels guilty that she made poor food choices. She has been working on behavior modification techniques to help reduce her emotional  eating and has been somewhat successful. She shows no sign of suicidal or homicidal ideations.  ASSESSMENT AND PLAN:  Other depression  Insulin resistance  Class 2 severe obesity with serious comorbidity and body mass index (BMI) of 35.0 to 35.9 in adult, unspecified obesity type ([facility])  PLAN:  Insulin Resistance Hailey Holden will continue to work on weight loss, exercise, increasing lean protein and decreasing simple carbohydrates in her diet to help decrease the risk of diabetes. She was informed that eating too many simple carbohydrates or too many calories at one sitting increases the likelihood of GI side effects. Hailey Holden agreed to follow up with us as directed to monitor her progress.  Depression with Emotional Eating Behaviors We discussed behavior modification techniques today to help Hailey Holden deal with her emotional eating and depression. She will continue to take Wellbutrin SR 150 mg daily and follow up as directed.  Obesity Hailey Holden is currently in the action stage of change. As such, her goal is to continue with weight loss efforts She has agreed to follow the Category 3 plan Hailey Holden will start walking next week for weight loss and overall health benefits. We discussed the following Behavioral Modification Strategies today: increase H2O intake, no skipping meals, keeping healthy foods in the home, increasing lean protein intake, decreasing simple carbohydrates, increasing vegetables, decrease eating out and work on meal planning and easy cooking plans Hailey Holden will weigh and measure her food.  Hailey Holden has agreed to follow up with our clinic in 2 weeks. She was informed of the importance of frequent follow up visits to maximize her success  with intensive lifestyle modifications for her multiple health conditions.  ALLERGIES: Allergies  Allergen Reactions  . Clindamycin/Lincomycin Shortness Of Breath and Rash  . Tdap [Tetanus-Diphth-Acell Pertussis] Itching    Itching, swelling in the arm,  chest tightness, wheezing, headache, SOB  . Latex   . Penicillins     MEDICATIONS: Current Outpatient Medications on File Prior to Visit  Medication Sig Dispense Refill  . albuterol (VENTOLIN HFA) 108 (90 Base) MCG/ACT inhaler INHALE 2 PUFFS BY MOUTH EVERY 4 HOURS AS NEEDED FOR WHEEZING 18 g 6  . amLODipine (NORVASC) 10 MG tablet TAKE 1 TABLET (10 MG TOTAL) BY MOUTH DAILY. 90 tablet 3  . buPROPion (WELLBUTRIN SR) 150 MG 12 hr tablet Take 1 tablet (150 mg total) by mouth daily. 30 tablet 0  . cetirizine (ZYRTEC) 10 MG tablet Take 10 mg by mouth daily.    . DULoxetine (CYMBALTA) 60 MG capsule TAKE 1 CAPSULE BY MOUTH DAILY. 90 capsule 0  . fluticasone (FLOVENT HFA) 44 MCG/ACT inhaler 2 puffs bid (Patient taking differently: 2 puffs bid prn) 1 Inhaler 5  . hydrochlorothiazide (MICROZIDE) 12.5 MG capsule Take 1 capsule (12.5 mg total) by mouth daily. 90 capsule 4  . ibuprofen (ADVIL,MOTRIN) 200 MG tablet Take 200 mg by mouth every 6 (six) hours as needed.    Marland Kitchen. levothyroxine (SYNTHROID, LEVOTHROID) 25 MCG tablet Take 1 tablet (25 mcg total) by mouth daily before breakfast. 90 tablet 3  . montelukast (SINGULAIR) 10 MG tablet Take 1 tablet (10 mg total) by mouth at bedtime. 90 tablet 1  . silver sulfADIAZINE (SILVADENE) 1 % cream Apply 1 application topically 2 (two) times daily. 50 g 1  . sulfamethoxazole-trimethoprim (BACTRIM DS) 800-160 MG tablet Take 1 tablet by mouth 2 (two) times daily. Take 1 bid 28 tablet 2  . vitamin B-12 (CYANOCOBALAMIN) 500 MCG tablet Take 500 mcg by mouth daily.     No current facility-administered medications on file prior to visit.     PAST MEDICAL HISTORY: Past Medical History:  Diagnosis Date  . Allergies   . Asthma   . Back pain   . Breast nodule 02/04/2014  . Constipation   . Constipation   . Depression   . Dizziness   . Dry skin   . Easy bruising   . Encounter for other contraceptive management 10/21/2014  . Enlarged thyroid 09/27/2012  . Fatigue   .  Fatty liver   . Fibromyalgia   . Floaters in visual field   . Glands swollen   . Hashimoto's disease   . Headache   . Heat intolerance   . Hidradenitis   . High cholesterol   . Hoarseness   . Hypertension   . Hypothyroidism   . IBS (irritable bowel syndrome)   . Joint pain   . Leg cramping   . Migraine headache   . Muscle stiffness   . Myalgia   . Nausea   . PMS (premenstrual syndrome) 09/27/2012  . Stress   . Swelling of lower extremity   . Tachycardia    patient states she has long history of resting tachycardia  . Thyroid nodule   . Uterine prolapse 10/21/2014  . Vaginal irritation 10/08/2015  . Vision changes   . Wheezing   . Yeast infection 10/08/2015    PAST SURGICAL HISTORY: History reviewed. No pertinent surgical history.  SOCIAL HISTORY: Social History   Tobacco Use  . Smoking status: Never Smoker  . Smokeless tobacco: Never Used  Substance Use Topics  .  Alcohol use: No  . Drug use: No    FAMILY HISTORY: Family History  Problem Relation Age of Onset  . Diabetes Mother   . Hypertension Mother   . Fibromyalgia Mother   . Obesity Mother   . Heart disease Father   . Hypertension Father   . Hyperlipidemia Father   . Obesity Father   . Sleep apnea Father   . Hypertension Maternal Grandmother   . Vision loss Maternal Grandmother   . Glaucoma Maternal Grandmother   . Stroke Maternal Grandmother   . Cancer Maternal Grandfather        melonoma  . Heart disease Paternal Grandmother   . Hypertension Paternal Grandmother   . Hyperlipidemia Paternal Grandmother   . Other Paternal Grandmother        macular degeneration  . Cancer Paternal Grandfather        lung  . Cancer Maternal Uncle        lung  . Colon cancer Maternal Uncle   . Cancer Paternal Aunt        breast  . Cancer Maternal Uncle        lung    ROS: Review of Systems  Constitutional: Positive for weight loss.  Endo/Heme/Allergies:       Negative for polyphagia   Psychiatric/Behavioral: Positive for depression. Negative for suicidal ideas.    PHYSICAL EXAM: Pt in no acute distress  RECENT LABS AND TESTS: BMET    Component Value Date/Time   NA 139 09/18/2018 0753   K 4.1 09/18/2018 0753   CL 103 09/18/2018 0753   CO2 22 09/18/2018 0753   GLUCOSE 88 09/18/2018 0753   GLUCOSE 81 10/17/2013 0901   BUN 10 09/18/2018 0753   CREATININE 0.64 09/18/2018 0753   CREATININE 0.55 10/17/2013 0901   CALCIUM 9.0 09/18/2018 0753   GFRNONAA 117 09/18/2018 0753   GFRAA 135 09/18/2018 0753   Lab Results  Component Value Date   HGBA1C 5.5 09/18/2018   HGBA1C 5.4 05/15/2018   HGBA1C 5.2 04/24/2015   HGBA1C 5.4 10/02/2012   Lab Results  Component Value Date   INSULIN 17.0 09/18/2018   INSULIN 13.7 05/15/2018   CBC    Component Value Date/Time   WBC 12.3 (H) 10/27/2016 1653   WBC 6.7 10/17/2013 0901   RBC 4.69 10/27/2016 1653   RBC 4.67 10/17/2013 0901   HGB 13.3 10/27/2016 1653   HCT 40.5 10/27/2016 1653   PLT 434 (H) 10/27/2016 1653   MCV 86 10/27/2016 1653   MCH 28.4 10/27/2016 1653   MCH 29.1 10/17/2013 0901   MCHC 32.8 10/27/2016 1653   MCHC 35.1 10/17/2013 0901   RDW 13.1 10/27/2016 1653   Iron/TIBC/Ferritin/ %Sat No results found for: IRON, TIBC, FERRITIN, IRONPCTSAT Lipid Panel     Component Value Date/Time   CHOL 252 (H) 02/16/2018 0812   TRIG 100 02/16/2018 0812   HDL 43 02/16/2018 0812   CHOLHDL 5.9 (H) 02/16/2018 0812   CHOLHDL 4.4 10/17/2013 0901   VLDL 16 10/17/2013 0901   LDLCALC 189 (H) 02/16/2018 0812   Hepatic Function Panel     Component Value Date/Time   PROT 6.7 09/18/2018 0753   ALBUMIN 4.3 09/18/2018 0753   AST 24 09/18/2018 0753   ALT 32 09/18/2018 0753   ALKPHOS 138 (H) 09/18/2018 0753   BILITOT 0.6 09/18/2018 0753   BILIDIR 0.14 02/16/2018 0812      Component Value Date/Time   TSH 2.480 09/18/2018 0753   TSH 2.110  05/15/2018 1203   TSH 2.690 02/16/2018 0953     Ref. Range 09/18/2018 07:53   Vitamin D, 25-Hydroxy Latest Ref Range: 30.0 - 100.0 ng/mL 79.2    I, Doreene Nest, am acting as Location manager for [employer]. Owens Shark, DO  I have reviewed the above documentation for accuracy and completeness, and I agree with the above. -Jearld Lesch, DO

## 2018-11-15 ENCOUNTER — Other Ambulatory Visit (INDEPENDENT_AMBULATORY_CARE_PROVIDER_SITE_OTHER): Payer: Self-pay

## 2018-11-15 ENCOUNTER — Encounter (INDEPENDENT_AMBULATORY_CARE_PROVIDER_SITE_OTHER): Payer: Self-pay | Admitting: Bariatrics

## 2018-11-15 ENCOUNTER — Other Ambulatory Visit (INDEPENDENT_AMBULATORY_CARE_PROVIDER_SITE_OTHER): Payer: Self-pay | Admitting: Bariatrics

## 2018-11-15 DIAGNOSIS — F3289 Other specified depressive episodes: Secondary | ICD-10-CM

## 2018-11-15 MED ORDER — BUPROPION HCL ER (SR) 150 MG PO TB12: 150.0000 mg | ORAL_TABLET | Freq: Every day | ORAL | 0 refills | Status: DC

## 2018-11-15 MED FILL — BUPROPION HCL SR 150 MG TAB: 150 | 30 days supply | Qty: 30 | Fill #0

## 2018-11-20 MED FILL — LEVOTHYROXINE 25 MCG TABLET: 25 | 90 days supply | Qty: 90 | Fill #0

## 2018-11-20 MED FILL — HYDROCHLOROTHIAZIDE 12.5 MG: 12.5 | 90 days supply | Qty: 90 | Fill #0

## 2018-11-27 MED FILL — BUPROPION HCL SR 150 MG TAB: 150 | 30 days supply | Qty: 30 | Fill #0

## 2018-12-27 ENCOUNTER — Ambulatory Visit (INDEPENDENT_AMBULATORY_CARE_PROVIDER_SITE_OTHER): Payer: No Typology Code available for payment source | Admitting: Bariatrics

## 2018-12-27 ENCOUNTER — Telehealth (INDEPENDENT_AMBULATORY_CARE_PROVIDER_SITE_OTHER): Payer: No Typology Code available for payment source | Admitting: Bariatrics

## 2018-12-27 ENCOUNTER — Encounter (INDEPENDENT_AMBULATORY_CARE_PROVIDER_SITE_OTHER): Payer: Self-pay | Admitting: Bariatrics

## 2018-12-27 ENCOUNTER — Other Ambulatory Visit: Payer: Self-pay

## 2018-12-27 DIAGNOSIS — E8881 Metabolic syndrome: Secondary | ICD-10-CM

## 2018-12-27 DIAGNOSIS — F3289 Other specified depressive episodes: Secondary | ICD-10-CM | POA: Diagnosis not present

## 2018-12-27 DIAGNOSIS — Z6835 Body mass index (BMI) 35.0-35.9, adult: Secondary | ICD-10-CM | POA: Diagnosis not present

## 2018-12-27 MED ORDER — BUPROPION HCL ER (SR) 200 MG PO TB12: 200.0000 mg | ORAL_TABLET | Freq: Every day | ORAL | 0 refills | Status: DC

## 2018-12-27 MED FILL — BUPROPION HCL SR 200 MG TAB: 200 | 30 days supply | Qty: 30 | Fill #0

## 2018-12-31 ENCOUNTER — Encounter (INDEPENDENT_AMBULATORY_CARE_PROVIDER_SITE_OTHER): Payer: Self-pay | Admitting: Bariatrics

## 2018-12-31 NOTE — Progress Notes (Signed)
Office: [phone]  /  Fax: [phone] TeleHealth Visit:  Hailey Holden has verbally consented to this TeleHealth visit today. The patient is located at work, the provider is located at the [employer] [facility] office. The participants in this visit include the listed provider and patient. The visit was conducted today via FaceTime.  HPI:   Chief Complaint: OBESITY Hailey Holden is here to discuss her progress with her obesity treatment plan. She is on the Category 3 plan and is following her eating plan approximately 0% of the time. She states she is exercising 0 minutes 0 times per week. Hailey Holden is doing well but has had increased stress and has gained 2 lbs. She states having had increased her stress eating.  We were unable to weigh the patient today for this TeleHealth visit. She feels as if she has gained 2 lbs since her last visit. She has lost 0 lbs since starting treatment with [name].  Depression, Hailey Holden is struggling with emotional eating and using food for comfort to the extent that it is negatively impacting her health. She often snacks when she is not hungry. Hailey Holden sometimes feels she is out of control and then feels guilty that she made poor food choices. She has been working on behavior modification techniques to help reduce her emotional eating and has been somewhat successful. Hailey Holden has been on Wellbutrin and shows no sign of suicidal or homicidal ideations.  Depression screen [facility] 2/9 05/15/2018 11/02/2017 10/27/2016 06/19/2015 05/27/2015  Decreased Interest 1 0 0 0 0  Down, Depressed, Hopeless 1 0 0 0 0  PHQ - 2 Score 2 0 0 0 0  Altered sleeping 1 - - - -  Tired, decreased energy 2 - - - -  Change in appetite 2 - - - -  Feeling bad or failure about yourself  1 - - - -  Trouble concentrating 2 - - - -  Moving slowly or fidgety/restless 0 - - - -  Suicidal thoughts 0 - - - -  PHQ-9 Score 10 - - - -  Difficult doing work/chores Not difficult at all - - - -   Insulin  Resistance Navika has a diagnosis of insulin resistance based on her elevated fasting insulin level >5. Although Roshan's blood glucose readings are still under good control, insulin resistance puts her at greater risk of metabolic syndrome and diabetes. She is not taking metformin currently and continues to work on diet and exercise to decrease risk of diabetes. No polyphagia.  ASSESSMENT AND PLAN:  Insulin resistance  Hailey depression - Plan: buPROPion (WELLBUTRIN SR) 200 MG 12 hr tablet  Class 2 severe obesity with serious comorbidity and body mass index (BMI) of 35.0 to 35.9 in adult, unspecified obesity type (HCC)  PLAN:  Depression, Hailey We discussed behavior modification techniques today to help Hailey Holden deal with her emotional eating and depression. Hailey Holden was given a prescription for Wellbutrin 200 mg 1 PO QD #30 with 0 refills and agrees to follow-up with our clinic in 2 weeks.  Insulin Resistance Hailey Holden will continue to work on weight loss, exercise, and decreasing simple carbohydrates in her diet to help decrease the risk of diabetes. We dicussed metformin including benefits and risks. She was informed that eating too many simple carbohydrates or too many calories at one sitting increases the likelihood of GI side effects. Hailey Holden was instructed to decrease carbohydrates, increase protein, and increase activities. She will follow-up with [name] as directed to monitor her progress.  Obesity Hailey Holden is currently in the action stage of change. As such, her goal is to continue with weight loss efforts. She has agreed to follow the Category 3 plan. Hailey Holden will work on meal planning, intentional eating, and will restart the plan. Hailey Holden has been instructed to work up to a goal of 150 minutes of combined cardio and strengthening exercise per week for weight loss and overall health benefits. We discussed the following Behavioral Modification Strategies today: increasing lean protein intake,  decreasing simple carbohydrates, increasing vegetables, increase H20 intake, decrease eating out, no skipping meals, work on meal planning and easy cooking plans, keeping healthy foods in the home, and planning for success.  Hailey Holden has agreed to follow-up with our clinic in 2 weeks. She was informed of the importance of frequent follow-up visits to maximize her success with intensive lifestyle modifications for her multiple health conditions.  ALLERGIES: Allergies  Allergen Reactions  . Clindamycin/Lincomycin Shortness Of Breath and Rash  . Tdap [Tetanus-Diphth-Acell Pertussis] Itching    Itching, swelling in the arm, chest tightness, wheezing, headache, SOB  . Latex   . Penicillins     MEDICATIONS: Current Outpatient Medications on File Prior to Visit  Medication Sig Dispense Refill  . albuterol (VENTOLIN HFA) 108 (90 Base) MCG/ACT inhaler INHALE 2 PUFFS BY MOUTH EVERY 4 HOURS AS NEEDED FOR WHEEZING 18 g 6  . amLODipine (NORVASC) 10 MG tablet TAKE 1 TABLET (10 MG TOTAL) BY MOUTH DAILY. 90 tablet 3  . cetirizine (ZYRTEC) 10 MG tablet Take 10 mg by mouth daily.    . DULoxetine (CYMBALTA) 60 MG capsule TAKE 1 CAPSULE BY MOUTH DAILY. 90 capsule 0  . fluticasone (FLOVENT HFA) 44 MCG/ACT inhaler 2 puffs bid (Patient taking differently: 2 puffs bid prn) 1 Inhaler 5  . hydrochlorothiazide (MICROZIDE) 12.5 MG capsule Take 1 capsule (12.5 mg total) by mouth daily. 90 capsule 4  . ibuprofen (ADVIL,MOTRIN) 200 MG tablet Take 200 mg by mouth every 6 (six) hours as needed.    Marland Kitchen. levothyroxine (SYNTHROID, LEVOTHROID) 25 MCG tablet Take 1 tablet (25 mcg total) by mouth daily before breakfast. 90 tablet 3  . montelukast (SINGULAIR) 10 MG tablet Take 1 tablet (10 mg total) by mouth at bedtime. 90 tablet 1  . silver sulfADIAZINE (SILVADENE) 1 % cream Apply 1 application topically 2 (two) times daily. 50 g 1  . sulfamethoxazole-trimethoprim (BACTRIM DS) 800-160 MG tablet Take 1 tablet by mouth 2 (two) times  daily. Take 1 bid 28 tablet 2  . vitamin B-12 (CYANOCOBALAMIN) 500 MCG tablet Take 500 mcg by mouth daily.     No current facility-administered medications on file prior to visit.     PAST MEDICAL HISTORY: Past Medical History:  Diagnosis Date  . Allergies   . Asthma   . Back pain   . Breast nodule 02/04/2014  . Constipation   . Constipation   . Depression   . Dizziness   . Dry skin   . Easy bruising   . Encounter for Hailey contraceptive management 10/21/2014  . Enlarged thyroid 09/27/2012  . Fatigue   . Fatty liver   . Fibromyalgia   . Floaters in visual field   . Glands swollen   . Hashimoto's disease   . Headache   . Heat intolerance   . Hidradenitis   . High cholesterol   . Hoarseness   . Hypertension   . Hypothyroidism   . IBS (irritable bowel syndrome)   . Joint pain   .  Leg cramping   . Migraine headache   . Muscle stiffness   . Myalgia   . Nausea   . PMS (premenstrual syndrome) 09/27/2012  . Stress   . Swelling of lower extremity   . Tachycardia    patient states she has long history of resting tachycardia  . Thyroid nodule   . Uterine prolapse 10/21/2014  . Vaginal irritation 10/08/2015  . Vision changes   . Wheezing   . Yeast infection 10/08/2015    PAST SURGICAL HISTORY: History reviewed. No pertinent surgical history.  SOCIAL HISTORY: Social History   Tobacco Use  . Smoking status: Never Smoker  . Smokeless tobacco: Never Used  Substance Use Topics  . Alcohol use: No  . Drug use: No    FAMILY HISTORY: Family History  Problem Relation Age of Onset  . Diabetes Mother   . Hypertension Mother   . Fibromyalgia Mother   . Obesity Mother   . Heart disease Father   . Hypertension Father   . Hyperlipidemia Father   . Obesity Father   . Sleep apnea Father   . Hypertension Maternal Grandmother   . Vision loss Maternal Grandmother   . Glaucoma Maternal Grandmother   . Stroke Maternal Grandmother   . Cancer Maternal Grandfather         melonoma  . Heart disease Paternal Grandmother   . Hypertension Paternal Grandmother   . Hyperlipidemia Paternal Grandmother   . Hailey Paternal Grandmother        macular degeneration  . Cancer Paternal Grandfather        lung  . Cancer Maternal Uncle        lung  . Colon cancer Maternal Uncle   . Cancer Paternal Aunt        breast  . Cancer Maternal Uncle        lung   ROS: Review of Systems  Endo/Heme/Allergies:       Negative for polyphagia.  Psychiatric/Behavioral: Positive for depression. Negative for suicidal ideas.       Negative for homicidal ideas.   PHYSICAL EXAM: Pt in no acute distress  RECENT LABS AND TESTS: BMET    Component Value Date/Time   NA 139 09/18/2018 0753   K 4.1 09/18/2018 0753   CL 103 09/18/2018 0753   CO2 22 09/18/2018 0753   GLUCOSE 88 09/18/2018 0753   GLUCOSE 81 10/17/2013 0901   BUN 10 09/18/2018 0753   CREATININE 0.64 09/18/2018 0753   CREATININE 0.55 10/17/2013 0901   CALCIUM 9.0 09/18/2018 0753   GFRNONAA 117 09/18/2018 0753   GFRAA 135 09/18/2018 0753   Lab Results  Component Value Date   HGBA1C 5.5 09/18/2018   HGBA1C 5.4 05/15/2018   HGBA1C 5.2 04/24/2015   HGBA1C 5.4 10/02/2012   Lab Results  Component Value Date   INSULIN 17.0 09/18/2018   INSULIN 13.7 05/15/2018   CBC    Component Value Date/Time   WBC 12.3 (H) 10/27/2016 1653   WBC 6.7 10/17/2013 0901   RBC 4.69 10/27/2016 1653   RBC 4.67 10/17/2013 0901   HGB 13.3 10/27/2016 1653   HCT 40.5 10/27/2016 1653   PLT 434 (H) 10/27/2016 1653   MCV 86 10/27/2016 1653   MCH 28.4 10/27/2016 1653   MCH 29.1 10/17/2013 0901   MCHC 32.8 10/27/2016 1653   MCHC 35.1 10/17/2013 0901   RDW 13.1 10/27/2016 1653   Iron/TIBC/Ferritin/ %Sat No results found for: IRON, TIBC, FERRITIN, IRONPCTSAT Lipid Panel  Component Value Date/Time   CHOL 252 (H) 02/16/2018 0812   TRIG 100 02/16/2018 0812   HDL 43 02/16/2018 0812   CHOLHDL 5.9 (H) 02/16/2018 0812   CHOLHDL 4.4  10/17/2013 0901   VLDL 16 10/17/2013 0901   LDLCALC 189 (H) 02/16/2018 0812   Hepatic Function Panel     Component Value Date/Time   PROT 6.7 09/18/2018 0753   ALBUMIN 4.3 09/18/2018 0753   AST 24 09/18/2018 0753   ALT 32 09/18/2018 0753   ALKPHOS 138 (H) 09/18/2018 0753   BILITOT 0.6 09/18/2018 0753   BILIDIR 0.14 02/16/2018 0812      Component Value Date/Time   TSH 2.480 09/18/2018 0753   TSH 2.110 05/15/2018 1203   TSH 2.690 02/16/2018 0953   Results for ZI, SEK (MRN [id number]) as of 12/31/2018 12:27  Ref. Range 09/18/2018 07:53  Vitamin D, 25-Hydroxy Latest Ref Range: 30.0 - 100.0 ng/mL 79.2   I, Marianna Payment, am acting as Energy manager for [employer], DO  I have reviewed the above documentation for accuracy and completeness, and I agree with the above. -Corinna Capra, DO

## 2019-01-10 ENCOUNTER — Other Ambulatory Visit: Payer: Self-pay | Admitting: Adult Health

## 2019-01-10 MED FILL — ALBUTEROL SULFATE HFA 108 (: 108 (90 BAS | 17 days supply | Qty: 9 | Fill #0

## 2019-01-14 ENCOUNTER — Other Ambulatory Visit: Payer: Self-pay | Admitting: Family Medicine

## 2019-01-14 ENCOUNTER — Other Ambulatory Visit: Payer: Self-pay | Admitting: Adult Health

## 2019-01-14 MED FILL — AMLODIPINE BESYLATE 10 MG T: 10 | 90 days supply | Qty: 90 | Fill #0

## 2019-01-14 NOTE — Telephone Encounter (Signed)
Call and sched appt, may ref so does not run out

## 2019-01-15 NOTE — Telephone Encounter (Signed)
Please schedule and then route back to nurses to send in refill. thanks 

## 2019-01-16 ENCOUNTER — Other Ambulatory Visit: Payer: Self-pay | Admitting: Family Medicine

## 2019-01-16 ENCOUNTER — Encounter: Payer: Self-pay | Admitting: Family Medicine

## 2019-01-16 MED FILL — DULoxetine HCL 60 MG CPEP: 60 | 90 days supply | Qty: 90 | Fill #0

## 2019-01-22 ENCOUNTER — Ambulatory Visit (INDEPENDENT_AMBULATORY_CARE_PROVIDER_SITE_OTHER): Payer: No Typology Code available for payment source | Admitting: Family Medicine

## 2019-01-22 ENCOUNTER — Other Ambulatory Visit: Payer: Self-pay

## 2019-01-22 ENCOUNTER — Encounter (INDEPENDENT_AMBULATORY_CARE_PROVIDER_SITE_OTHER): Payer: Self-pay | Admitting: Family Medicine

## 2019-01-22 VITALS — BP 102/69 | HR 128 | Temp 98.0°F | Ht 64.0 in | Wt 201.0 lb

## 2019-01-22 DIAGNOSIS — Z6834 Body mass index (BMI) 34.0-34.9, adult: Secondary | ICD-10-CM | POA: Diagnosis not present

## 2019-01-22 DIAGNOSIS — E8881 Metabolic syndrome: Secondary | ICD-10-CM

## 2019-01-22 DIAGNOSIS — E6609 Other obesity due to excess calories: Secondary | ICD-10-CM

## 2019-01-22 DIAGNOSIS — F3289 Other specified depressive episodes: Secondary | ICD-10-CM

## 2019-01-23 NOTE — Telephone Encounter (Signed)
[facility] - need to schedule 

## 2019-01-23 NOTE — Progress Notes (Signed)
Office: [phone]  /  Fax: [phone]   HPI:   Chief Complaint: OBESITY Hailey Holden is here to discuss her progress with her obesity treatment plan. She is on the Category 3 plan and is following her eating plan approximately 75 % of the time. She states she is exercising 0 minutes 0 times per week. Hailey Holden has dropped 4 lbs since the start of the pandemic 7 months ago. She has deviated significantly from her plan, but is still trying to be mindful and "eating healthy".  Her weight is 201 lb (91.2 kg) today and has had a weight loss of 4 pounds over a period of 7 months since her last visit. She has lost 3 lbs since starting treatment with [name].  Insulin Resistance Hailey Holden has a diagnosis of insulin resistance based on her elevated fasting insulin level >5. Although Hailey Holden blood glucose readings are still under good control, insulin resistance puts her at greater risk of metabolic syndrome and diabetes. She is not taking metformin currently and she is stable on her diet prescription. Last A1c was 5.5. She denies nausea, vomiting, or hypoglycemia.  Depression with Emotional Eating Behaviors Hailey Holden increased Wellbutrin 2 weeks ago and feels she is doing better on the higher dose. Her mood is stable and she notes decreased emotional eating. Hailey Holden struggles with emotional eating and using food for comfort to the extent that it is negatively impacting her health. She often snacks when she is not hungry. Hailey Holden sometimes feels she is out of control and then feels guilty that she made poor food choices. She has been working on behavior modification techniques to help reduce her emotional eating and has been somewhat successful. She shows no sign of suicidal or homicidal ideations.  Depression screen [facility] 2/9 05/15/2018 11/02/2017 10/27/2016 06/19/2015 05/27/2015  Decreased Interest 1 0 0 0 0  Down, Depressed, Hopeless 1 0 0 0 0  PHQ - 2 Score 2 0 0 0 0  Altered sleeping 1 - - - -  Tired, decreased energy 2  - - - -  Change in appetite 2 - - - -  Feeling bad or failure about yourself  1 - - - -  Trouble concentrating 2 - - - -  Moving slowly or fidgety/restless 0 - - - -  Suicidal thoughts 0 - - - -  PHQ-9 Score 10 - - - -  Difficult doing work/chores Not difficult at all - - - -    ASSESSMENT AND PLAN:  Insulin resistance  Other depression - with emotional eating  Class 1 obesity due to excess calories with serious comorbidity and body mass index (BMI) of 34.0 to 34.9 in adult  PLAN:  Insulin Resistance Hailey Holden will continue to work on weight loss, diet, exercise, and decreasing simple carbohydrates in her diet to help decrease the risk of diabetes. We dicussed metformin including benefits and risks. She was informed that eating too many simple carbohydrates or too many calories at one sitting increases the likelihood of GI side effects. We will recheck labs in 1 month. Hailey Holden agrees to follow up with [name] as directed to monitor her progress.  Depression with Emotional Eating Behaviors We discussed behavior modification techniques today to help Hailey Holden deal with her emotional eating and depression. Adeja agrees to continue taking Wellbutrin SR 200 mg q AM, and she will continue Cymbalta. Hailey Holden agrees to follow up with our clinic in 2 weeks.  I spent > than 50% of the 25 minute visit on counseling as  documented in the note.  Obesity Hailey Holden is currently in the action stage of change. As such, her goal is to continue with weight loss efforts She has agreed to change to keep a food journal with 1300-1600 calories and 90+ grams of protein daily Hailey Holden has been instructed to work up to a goal of 150 minutes of combined cardio and strengthening exercise per week for weight loss and overall health benefits. We discussed the following Behavioral Modification Strategies today: increasing lean protein intake, decreasing simple carbohydrates  and work on meal planning and easy cooking plans    Hailey Holden has agreed to follow up with our clinic in 2 weeks. She was informed of the importance of frequent follow up visits to maximize her success with intensive lifestyle modifications for her multiple health conditions.  ALLERGIES: Allergies  Allergen Reactions  . Clindamycin/Lincomycin Shortness Of Breath and Rash  . Tdap [Tetanus-Diphth-Acell Pertussis] Itching    Itching, swelling in the arm, chest tightness, wheezing, headache, SOB  . Latex   . Penicillins     MEDICATIONS: Current Outpatient Medications on File Prior to Visit  Medication Sig Dispense Refill  . albuterol (VENTOLIN HFA) 108 (90 Base) MCG/ACT inhaler INHALE 2 PUFFS BY MOUTH EVERY 4 HOURS AS NEEDED FOR WHEEZING 18 g 6  . amLODipine (NORVASC) 10 MG tablet TAKE 1 TABLET BY MOUTH DAILY. 90 tablet 4  . buPROPion (WELLBUTRIN SR) 200 MG 12 hr tablet Take 1 tablet (200 mg total) by mouth daily. 30 tablet 0  . cetirizine (ZYRTEC) 10 MG tablet Take 10 mg by mouth daily.    . Cholecalciferol (VITAMIN D) 125 MCG (5000 UT) CAPS Take 1 capsule by mouth daily.    . DULoxetine (CYMBALTA) 60 MG capsule TAKE 1 CAPSULE BY MOUTH DAILY. 90 capsule 0  . fluticasone (FLOVENT HFA) 44 MCG/ACT inhaler 2 puffs bid (Patient taking differently: 2 puffs bid prn) 1 Inhaler 5  . hydrochlorothiazide (MICROZIDE) 12.5 MG capsule Take 1 capsule (12.5 mg total) by mouth daily. 90 capsule 4  . ibuprofen (ADVIL,MOTRIN) 200 MG tablet Take 200 mg by mouth every 6 (six) hours as needed.    Marland Kitchen. levothyroxine (SYNTHROID, LEVOTHROID) 25 MCG tablet Take 1 tablet (25 mcg total) by mouth daily before breakfast. 90 tablet 3  . montelukast (SINGULAIR) 10 MG tablet Take 1 tablet (10 mg total) by mouth at bedtime. 90 tablet 1  . silver sulfADIAZINE (SILVADENE) 1 % cream Apply 1 application topically 2 (two) times daily. 50 g 1  . vitamin B-12 (CYANOCOBALAMIN) 500 MCG tablet Take 500 mcg by mouth daily.    Marland Kitchen. sulfamethoxazole-trimethoprim (BACTRIM DS) 800-160 MG tablet  Take 1 tablet by mouth 2 (two) times daily. Take 1 bid (Patient not taking: Reported on 01/22/2019) 28 tablet 2   No current facility-administered medications on file prior to visit.     PAST MEDICAL HISTORY: Past Medical History:  Diagnosis Date  . Allergies   . Asthma   . Back pain   . Breast nodule 02/04/2014  . Constipation   . Constipation   . Depression   . Dizziness   . Dry skin   . Easy bruising   . Encounter for other contraceptive management 10/21/2014  . Enlarged thyroid 09/27/2012  . Fatigue   . Fatty liver   . Fibromyalgia   . Floaters in visual field   . Glands swollen   . Hashimoto's disease   . Headache   . Heat intolerance   . Hidradenitis   .  High cholesterol   . Hoarseness   . Hypertension   . Hypothyroidism   . IBS (irritable bowel syndrome)   . Joint pain   . Leg cramping   . Migraine headache   . Muscle stiffness   . Myalgia   . Nausea   . PMS (premenstrual syndrome) 09/27/2012  . Stress   . Swelling of lower extremity   . Tachycardia    patient states she has long history of resting tachycardia  . Thyroid nodule   . Uterine prolapse 10/21/2014  . Vaginal irritation 10/08/2015  . Vision changes   . Wheezing   . Yeast infection 10/08/2015    PAST SURGICAL HISTORY: History reviewed. No pertinent surgical history.  SOCIAL HISTORY: Social History   Tobacco Use  . Smoking status: Never Smoker  . Smokeless tobacco: Never Used  Substance Use Topics  . Alcohol use: No  . Drug use: No    FAMILY HISTORY: Family History  Problem Relation Age of Onset  . Diabetes Mother   . Hypertension Mother   . Fibromyalgia Mother   . Obesity Mother   . Heart disease Father   . Hypertension Father   . Hyperlipidemia Father   . Obesity Father   . Sleep apnea Father   . Hypertension Maternal Grandmother   . Vision loss Maternal Grandmother   . Glaucoma Maternal Grandmother   . Stroke Maternal Grandmother   . Cancer Maternal Grandfather         melonoma  . Heart disease Paternal Grandmother   . Hypertension Paternal Grandmother   . Hyperlipidemia Paternal Grandmother   . Other Paternal Grandmother        macular degeneration  . Cancer Paternal Grandfather        lung  . Cancer Maternal Uncle        lung  . Colon cancer Maternal Uncle   . Cancer Paternal Aunt        breast  . Cancer Maternal Uncle        lung    ROS: Review of Systems  Constitutional: Positive for weight loss.  Gastrointestinal: Negative for nausea and vomiting.  Endo/Heme/Allergies:       Negative hypoglycemia  Psychiatric/Behavioral: Positive for depression. Negative for suicidal ideas.    PHYSICAL EXAM: Blood pressure 102/69, pulse (!) 128, temperature 98 F (36.7 C), temperature source Oral, height  (1.626 m), weight 201 lb (91.2 kg), last menstrual period 01/07/2019, SpO2 98 %. Body mass index is 34.5 kg/m. Physical Exam Vitals signs reviewed.  Constitutional:      Appearance: Normal appearance. She is obese.  Cardiovascular:     Rate and Rhythm: Normal rate.     Pulses: Normal pulses.  Pulmonary:     Effort: Pulmonary effort is normal.     Breath sounds: Normal breath sounds.  Musculoskeletal: Normal range of motion.  Skin:    General: Skin is warm and dry.  Neurological:     Mental Status: She is alert and oriented to person, place, and time.  Psychiatric:        Mood and Affect: Mood normal.        Behavior: Behavior normal.     RECENT LABS AND TESTS: BMET    Component Value Date/Time   NA 139 09/18/2018 0753   K 4.1 09/18/2018 0753   CL 103 09/18/2018 0753   CO2 22 09/18/2018 0753   GLUCOSE 88 09/18/2018 0753   GLUCOSE 81 10/17/2013 0901   BUN 10  09/18/2018 0753   CREATININE 0.64 09/18/2018 0753   CREATININE 0.55 10/17/2013 0901   CALCIUM 9.0 09/18/2018 0753   GFRNONAA 117 09/18/2018 0753   GFRAA 135 09/18/2018 0753   Lab Results  Component Value Date   HGBA1C 5.5 09/18/2018   HGBA1C 5.4 05/15/2018    HGBA1C 5.2 04/24/2015   HGBA1C 5.4 10/02/2012   Lab Results  Component Value Date   INSULIN 17.0 09/18/2018   INSULIN 13.7 05/15/2018   CBC    Component Value Date/Time   WBC 12.3 (H) 10/27/2016 1653   WBC 6.7 10/17/2013 0901   RBC 4.69 10/27/2016 1653   RBC 4.67 10/17/2013 0901   HGB 13.3 10/27/2016 1653   HCT 40.5 10/27/2016 1653   PLT 434 (H) 10/27/2016 1653   MCV 86 10/27/2016 1653   MCH 28.4 10/27/2016 1653   MCH 29.1 10/17/2013 0901   MCHC 32.8 10/27/2016 1653   MCHC 35.1 10/17/2013 0901   RDW 13.1 10/27/2016 1653   Iron/TIBC/Ferritin/ %Sat No results found for: IRON, TIBC, FERRITIN, IRONPCTSAT Lipid Panel     Component Value Date/Time   CHOL 252 (H) 02/16/2018 0812   TRIG 100 02/16/2018 0812   HDL 43 02/16/2018 0812   CHOLHDL 5.9 (H) 02/16/2018 0812   CHOLHDL 4.4 10/17/2013 0901   VLDL 16 10/17/2013 0901   LDLCALC 189 (H) 02/16/2018 0812   Hepatic Function Panel     Component Value Date/Time   PROT 6.7 09/18/2018 0753   ALBUMIN 4.3 09/18/2018 0753   AST 24 09/18/2018 0753   ALT 32 09/18/2018 0753   ALKPHOS 138 (H) 09/18/2018 0753   BILITOT 0.6 09/18/2018 0753   BILIDIR 0.14 02/16/2018 0812      Component Value Date/Time   TSH 2.480 09/18/2018 0753   TSH 2.110 05/15/2018 1203   TSH 2.690 02/16/2018 0953      OBESITY BEHAVIORAL INTERVENTION VISIT  Today's visit was # 13   Starting weight: 204 lbs Starting date: 05/15/2018 Today's weight : 201 lbs Today's date: 01/22/2019 Total lbs lost to date: 3    ASK: We discussed the diagnosis of obesity with Hailey Holden today and Hailey Holden agreed to give [name] permission to discuss obesity behavioral modification therapy today.  ASSESS: Hailey Holden has the diagnosis of obesity and her BMI today is 34.48 Hailey Holden is in the action stage of change   ADVISE: Hailey Holden was educated on the multiple health risks of obesity as well as the benefit of weight loss to improve her health. She was advised of the need for  long term treatment and the importance of lifestyle modifications to improve her current health and to decrease her risk of future health problems.  AGREE: Multiple dietary modification options and treatment options were discussed and  Hailey Holden agreed to follow the recommendations documented in the above note.  ARRANGE: Hailey Holden was educated on the importance of frequent visits to treat obesity as outlined per CMS and USPSTF guidelines and agreed to schedule her next follow up appointment today.  I, Trixie Dredge, am acting as transcriptionist for Dennard Nip, MD  I have reviewed the above documentation for accuracy and completeness, and I agree with the above. -Dennard Nip, MD

## 2019-01-25 ENCOUNTER — Other Ambulatory Visit: Payer: Self-pay | Admitting: Family Medicine

## 2019-01-28 ENCOUNTER — Encounter (INDEPENDENT_AMBULATORY_CARE_PROVIDER_SITE_OTHER): Payer: Self-pay | Admitting: Family Medicine

## 2019-01-28 MED FILL — MONTELUKAST SOD 10 MG TAB: 10 | 90 days supply | Qty: 90 | Fill #0

## 2019-01-29 NOTE — Telephone Encounter (Signed)
Please advise 

## 2019-02-01 ENCOUNTER — Ambulatory Visit (INDEPENDENT_AMBULATORY_CARE_PROVIDER_SITE_OTHER): Payer: No Typology Code available for payment source | Admitting: Nurse Practitioner

## 2019-02-01 ENCOUNTER — Encounter: Payer: Self-pay | Admitting: Nurse Practitioner

## 2019-02-01 DIAGNOSIS — J4520 Mild intermittent asthma, uncomplicated: Secondary | ICD-10-CM | POA: Diagnosis not present

## 2019-02-01 DIAGNOSIS — M2550 Pain in unspecified joint: Secondary | ICD-10-CM | POA: Insufficient documentation

## 2019-02-01 MED ORDER — DULOXETINE HCL 60 MG PO CPEP: 60.0000 mg | ORAL_CAPSULE | Freq: Every day | ORAL | 1 refills | Status: DC

## 2019-02-01 MED ORDER — MONTELUKAST SODIUM 10 MG PO TABS: 10.0000 mg | ORAL_TABLET | Freq: Every day | ORAL | 1 refills | Status: DC

## 2019-02-01 NOTE — Progress Notes (Signed)
  VIDEO VISIT Subjective:    Patient ID: Hailey Holden, female    DOB: December 14, 1985, 33 y.o.   MRN: [id number]  HPImed check up. Needs refills on cymbalta and singular. Pt states no concerns. She is doing well. phq9 done.   Virtual Visit via Video Note  I connected with Hailey Holden on 02/01/19 at 10:20 AM EDT by a video enabled telemedicine application and verified that I am speaking with the correct person using two identifiers.  Location: Patient: home Provider: office   I discussed the limitations of evaluation and management by telemedicine and the availability of in person appointments. The patient expressed understanding and agreed to proceed.  History of Present Illness: Presents requesting refills on her medications. Is off her steroid inhaler at this time. Occasional use of Albuterol during weather change otherwise asthma stable. Singulair working very well to keep her under control. Cymbalta working well. Has a long term history of chronic joint pain, mainly in the knees and ankles bilaterally but also affects the hands at times. Pain will vary. Occasional edema. No erythema or warmth. Wonders if her Hashimoto's has flared up. Denies difficulty swallowing. No fevers. No sore throats.   Observations/Objective: Format  Patient present at home Provider present at office Consent for interaction obtained Coronavirus outbreak made virtual visit necessary  Alert, oriented. Patient pressed on her thyroid. Denies tenderness. States slight "fullness" on right side as compared to left.  Last labs were in June 2020. See results.  RA factor, ANA and Lyme disease titer all normal in May 2017.   Assessment and Plan: Problem List Items Addressed This Visit      Respiratory   Asthma - Primary (Chronic)     Other   Multiple joint pain     Meds ordered this encounter  Medications  . DULoxetine (CYMBALTA) 60 MG capsule    Sig: Take 1 capsule (60 mg total) by mouth daily.   Dispense:  90 capsule    Refill:  1    Order Specific Question:   Supervising Provider    Answer:   Sallee Lange A [[id number]]  . montelukast (SINGULAIR) 10 MG tablet    Sig: Take 1 tablet (10 mg total) by mouth at bedtime.    Dispense:  90 tablet    Refill:  1    Order Specific Question:   Supervising Provider    Answer:   Sallee Lange A [[id number]]     Follow Up Instructions: Continue current medications as directed. Restart steroid inhaler if she is using Albuterol on a regular basis. Will refer to rheumatology for consultation due to chronic joint pain.  Return in about 6 months (around 08/02/2019). Gets her preventive health physicals with GYN.   I discussed the assessment and treatment plan with the patient. The patient was provided an opportunity to ask questions and all were answered. The patient agreed with the plan and demonstrated an understanding of the instructions.   The patient was advised to call back or seek an in-person evaluation if the symptoms worsen or if the condition fails to improve as anticipated.  I provided 15 minutes of non-face-to-face time during this encounter.       Review of Systems     Objective:   Physical Exam        Assessment & Plan:

## 2019-02-04 ENCOUNTER — Other Ambulatory Visit (INDEPENDENT_AMBULATORY_CARE_PROVIDER_SITE_OTHER): Payer: Self-pay

## 2019-02-04 DIAGNOSIS — F3289 Other specified depressive episodes: Secondary | ICD-10-CM

## 2019-02-04 NOTE — Telephone Encounter (Signed)
Please advise 

## 2019-02-04 NOTE — Telephone Encounter (Signed)
Yes, please go ahead and refill her wellbutrin

## 2019-02-05 ENCOUNTER — Other Ambulatory Visit: Payer: Self-pay

## 2019-02-05 ENCOUNTER — Ambulatory Visit (INDEPENDENT_AMBULATORY_CARE_PROVIDER_SITE_OTHER): Payer: No Typology Code available for payment source | Admitting: Adult Health

## 2019-02-05 ENCOUNTER — Encounter: Payer: Self-pay | Admitting: Adult Health

## 2019-02-05 VITALS — BP 128/80 | HR 132 | Ht 64.0 in | Wt 206.2 lb

## 2019-02-05 DIAGNOSIS — Z01419 Encounter for gynecological examination (general) (routine) without abnormal findings: Secondary | ICD-10-CM | POA: Diagnosis not present

## 2019-02-05 NOTE — Progress Notes (Signed)
Patient ID: Hailey Holden, female   DOB: 06-06-1985, 33 y.o.   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 PCP is Hailey Holden.   Current Medications, Allergies, Past Medical History, Past Surgical History, Family History and Social History were reviewed in [employer] record.     Review of Systems:  Patient denies any headaches, hearing loss, fatigue, blurred vision, shortness of breath, chest pain, abdominal pain, problems with bowel movements, urination, or intercourse. No mood swings. Has joint pains and stiffness esp ankles, knees, wrist and  hands and elbows.She has referral to see Dr Elpidio Galea.   Physical Exam:BP 128/80 (BP Location: Left Arm, Patient Position: Sitting, Cuff Size: Large)   Pulse (!) 132   Ht 5\' 4"  (1.626 m)   Wt 206 lb 3.2 oz (93.5 kg)   LMP 01/07/2019 (Exact Date)   Breastfeeding Unknown   BMI 35.39 kg/m  General:  Well developed, well nourished, no acute distress Skin:  Warm and dry Neck:  Midline trachea, slightly enlarged thyroid, good ROM, no lymphadenopathy Lungs; Clear to auscultation bilaterally Breast:  No dominant palpable mass, retraction, or nipple discharge Cardiovascular: Regular rate and rhythm Abdomen:  Soft, non tender, no hepatosplenomegaly Pelvic:  External genitalia is normal in appearance,has healed scars from hidradenitis. The vagina is normal in appearance. Urethra has no lesions or masses. The cervix is bulbous.  Uterus is felt to be normal size, shape, and contour.  No adnexal masses or tenderness noted.Bladder is non tender, no masses felt. Extremities/musculoskeletal:  No swelling or varicosities noted, no clubbing or cyanosis Psych:  No mood changes, alert and cooperative,seems happy Fall risk is low PHQ 9 score is 0.  Pt gave verbal consent  for exam without chaperone.   Impression and Plan:  1. Encounter for well woman exam with routine gynecological exam Pap and physical in 1 year Labs with PCP  Mammogram at 35

## 2019-02-07 ENCOUNTER — Encounter (INDEPENDENT_AMBULATORY_CARE_PROVIDER_SITE_OTHER): Payer: Self-pay | Admitting: Family Medicine

## 2019-02-07 ENCOUNTER — Other Ambulatory Visit (INDEPENDENT_AMBULATORY_CARE_PROVIDER_SITE_OTHER): Payer: Self-pay

## 2019-02-07 ENCOUNTER — Other Ambulatory Visit: Payer: Self-pay

## 2019-02-07 ENCOUNTER — Ambulatory Visit (INDEPENDENT_AMBULATORY_CARE_PROVIDER_SITE_OTHER): Payer: No Typology Code available for payment source | Admitting: Family Medicine

## 2019-02-07 VITALS — BP 121/80 | HR 129 | Temp 98.4°F | Ht 64.0 in | Wt 203.0 lb

## 2019-02-07 DIAGNOSIS — F3289 Other specified depressive episodes: Secondary | ICD-10-CM | POA: Diagnosis not present

## 2019-02-07 DIAGNOSIS — Z9189 Other specified personal risk factors, not elsewhere classified: Secondary | ICD-10-CM | POA: Diagnosis not present

## 2019-02-07 DIAGNOSIS — E8881 Metabolic syndrome: Secondary | ICD-10-CM

## 2019-02-07 DIAGNOSIS — E6609 Other obesity due to excess calories: Secondary | ICD-10-CM

## 2019-02-07 DIAGNOSIS — Z6834 Body mass index (BMI) 34.0-34.9, adult: Secondary | ICD-10-CM

## 2019-02-07 MED ORDER — BD PEN NEEDLE NANO 2ND GEN 32G X 4 MM MISC: 1.0000 | Freq: Two times a day (BID) | 0 refills | Status: DC

## 2019-02-07 MED ORDER — SAXENDA 18 MG/3ML ~~LOC~~ SOPN: 3.0000 mg | PEN_INJECTOR | Freq: Every day | SUBCUTANEOUS | 0 refills | Status: DC

## 2019-02-11 NOTE — Progress Notes (Signed)
Office: [phone]  /  Fax: [phone]   HPI:   Chief Complaint: OBESITY Hailey Holden is here to discuss her progress with her obesity treatment plan. She is on the keep a food journal with 1300-1600 calories and 90+ grams of protein daily and is following her eating plan approximately 35 % of the time. She states she is exercising 0 minutes 0 times per week. Hailey Holden struggles with following her plan. She notes hunger and cravings are a challenge, and is especially stressed now. She would like to discuss other medication options.  Her weight is 203 lb (92.1 kg) today and has gained 2 lbs since her last visit. She has lost 1 lb since starting treatment with us.  Insulin Resistance Hailey Holden has a diagnosis of insulin resistance based on her elevated fasting insulin level >5. Although Hailey Holden's blood glucose readings are still under good control, insulin resistance puts her at greater risk of metabolic syndrome and diabetes. She notes polyphagia, worse in the PM in addition to stress and comfort eating. She continues to work on diet and exercise to decrease risk of diabetes.  At risk for diabetes Hailey Holden is at higher than average risk for developing diabetes due to her obesity and insulin resistance. She currently denies polyuria or polydipsia.  Depression with Emotional Eating Behaviors Hailey Holden tapered off her Wellbutrin as she felt it wasn't helping and may have increased irritability. She is still stress eating. Hailey Holden struggles with emotional eating and using food for comfort to the extent that it is negatively impacting her health. She often snacks when she is not hungry. Hailey Holden sometimes feels she is out of control and then feels guilty that she made poor food choices. She has been working on behavior modification techniques to help reduce her emotional eating and has been somewhat successful. She shows no sign of suicidal or homicidal ideations.  Dep[facility] 2/[facility] 02/05/2019 02/01/2019  05/15/2018 11/02/2017 10/27/2016  Decreased Interest 0 0 1 0 0  Down, Depressed, Hopeless 0 0 1 0 0  PHQ - 2 Score 0 0 2 0 0  Altered sleeping 0 2 1 - -  Tired, decreased energy 0 0 2 - -  Change in appetite 0 0 2 - -  Feeling bad or failure about yourself  0 0 1 - -  Trouble concentrating 0 0 2 - -  Moving slowly or fidgety/restless 0 0 0 - -  Suicidal thoughts 0 0 0 - -  PHQ-9 Score 0 2 10 - -  Difficult doing work/chores - - Not difficult at all - -    ASSESSMENT AND PLAN:  Insulin resistance  Other depression - with emotional eating  At risk for diabetes mellitus  Class 1 obesity due to excess calories with serious comorbidity and body mass index (BMI) of 34.0 to 34.9 in adult - Plan: Liraglutide -Weight Management (SAXENDA) 18 MG/3ML SOPN, Insulin Pen Needle (BD PEN NEEDLE NANO 2ND GEN) 32G X 4 MM MISC  PLAN:  Insulin Resistance Hailey Holden will continue to work on weight loss, diet, exercise, and decreasing simple carbohydrates in her diet to help decrease the risk of diabetes. We dicussed metformin including benefits and risks. She was informed that eating too many simple carbohydrates or too many calories at one sitting increases the likelihood of GI side effects. Hailey Holden agrees to start Liraglutide, and she agrees to follow up with our clinic in 2 weeks as directed to monitor her progress.  Diabetes risk counseling Hailey Holden was given extended (15  minutes) diabetes prevention counseling today. She is 33 y.o. female and has risk factors for diabetes including obesity and insulin resistance. We discussed intensive lifestyle modifications today with an emphasis on weight loss as well as increasing exercise and decreasing simple carbohydrates in her diet.  Depression with Emotional Eating Behaviors We discussed behavior modification techniques today to help Hailey Holden deal with her emotional eating and depression. Hailey Holden agrees to discontinue Wellbutrin and will continue to follow up, and we  will reassess in 2 weeks.  Obesity Hailey Holden is currently in the action stage of change. As such, her goal is to continue with weight loss efforts She has agreed to follow the Category 3 plan Hailey Holden has been instructed to work up to a goal of 150 minutes of combined cardio and strengthening exercise per week for weight loss and overall health benefits. We discussed the following Behavioral Modification Strategies today: emotional eating strategies and no skipping meals We discussed various medication options to help Hailey Holden with her weight loss efforts and we both agreed to start Saxenda 3.0 mg SubQ daily #5 pens (start at 0.6 mg) with no refills.  Girl has agreed to follow up with our clinic in 2 weeks with Hailey Holden. She was informed of the importance of frequent follow up visits to maximize her success with intensive lifestyle modifications for her multiple health conditions.  ALLERGIES: Allergies  Allergen Reactions  . Clindamycin/Lincomycin Shortness Of Breath and Rash  . Tdap [Tetanus-Diphth-Acell Pertussis] Itching    Itching, swelling in the arm, chest tightness, wheezing, headache, SOB  . Latex   . Penicillins     MEDICATIONS: Current Outpatient Medications on File Prior to Visit  Medication Sig Dispense Refill  . albuterol (VENTOLIN HFA) 108 (90 Base) MCG/ACT inhaler INHALE 2 PUFFS BY MOUTH EVERY 4 HOURS AS NEEDED FOR WHEEZING 18 g 6  . amLODipine (NORVASC) 10 MG tablet TAKE 1 TABLET BY MOUTH DAILY. 90 tablet 4  . buPROPion (WELLBUTRIN SR) 200 MG 12 hr tablet Take 1 tablet (200 mg total) by mouth daily. 30 tablet 0  . cetirizine (ZYRTEC) 10 MG tablet Take 10 mg by mouth daily.    . Cholecalciferol (VITAMIN D) 125 MCG (5000 UT) CAPS Take 1 capsule by mouth daily.    . DULoxetine (CYMBALTA) 60 MG capsule Take 1 capsule (60 mg total) by mouth daily. 90 capsule 1  . fluticasone (FLOVENT HFA) 44 MCG/ACT inhaler 2 puffs bid (Patient taking differently: 2 puffs bid prn) 1 Inhaler 5   . hydrochlorothiazide (MICROZIDE) 12.5 MG capsule Take 1 capsule (12.5 mg total) by mouth daily. 90 capsule 4  . ibuprofen (ADVIL,MOTRIN) 200 MG tablet Take 200 mg by mouth every 6 (six) hours as needed.    Marland Kitchen levothyroxine (SYNTHROID, LEVOTHROID) 25 MCG tablet Take 1 tablet (25 mcg total) by mouth daily before breakfast. 90 tablet 3  . montelukast (SINGULAIR) 10 MG tablet Take 1 tablet (10 mg total) by mouth at bedtime. 90 tablet 1  . silver sulfADIAZINE (SILVADENE) 1 % cream Apply 1 application topically 2 (two) times daily. 50 g 1  . vitamin B-12 (CYANOCOBALAMIN) 500 MCG tablet Take 500 mcg by mouth daily.     No current facility-administered medications on file prior to visit.     PAST MEDICAL HISTORY: Past Medical History:  Diagnosis Date  . Allergies   . Asthma   . Back pain   . Breast nodule 02/04/2014  . Constipation   . Constipation   . Depression   .  Dizziness   . Dry skin   . Easy bruising   . Encounter for other contraceptive management 10/21/2014  . Enlarged thyroid 09/27/2012  . Fatigue   . Fatty liver   . Fibromyalgia   . Floaters in visual field   . Glands swollen   . Hashimoto's disease   . Headache   . Heat intolerance   . Hidradenitis   . High cholesterol   . Hoarseness   . Hypertension   . Hypothyroidism   . IBS (irritable bowel syndrome)   . Joint pain   . Leg cramping   . Migraine headache   . Muscle stiffness   . Myalgia   . Nausea   . PMS (premenstrual syndrome) 09/27/2012  . Stress   . Swelling of lower extremity   . Tachycardia    patient states she has long history of resting tachycardia  . Thyroid nodule   . Uterine prolapse 10/21/2014  . Vaginal irritation 10/08/2015  . Vision changes   . Wheezing   . Yeast infection 10/08/2015    PAST SURGICAL HISTORY: History reviewed. No pertinent surgical history.  SOCIAL HISTORY: Social History   Tobacco Use  . Smoking status: Never Smoker  . Smokeless tobacco: Never Used  Substance Use  Topics  . Alcohol use: No  . Drug use: No    FAMILY HISTORY: Family History  Problem Relation Age of Onset  . Diabetes Mother   . Hypertension Mother   . Fibromyalgia Mother   . Obesity Mother   . Heart disease Father   . Hypertension Father   . Hyperlipidemia Father   . Obesity Father   . Sleep apnea Father   . Hypertension Maternal Grandmother   . Vision loss Maternal Grandmother   . Glaucoma Maternal Grandmother   . Stroke Maternal Grandmother   . Cancer Maternal Grandfather        melonoma  . Heart disease Paternal Grandmother   . Hypertension Paternal Grandmother   . Hyperlipidemia Paternal Grandmother   . Other Paternal Grandmother        macular degeneration  . Cancer Paternal Grandfather        lung  . Cancer Maternal Uncle        lung  . Colon cancer Maternal Uncle   . Cancer Paternal Aunt        breast  . Cancer Maternal Uncle        lung    ROS: Review of Systems  Constitutional: Negative for weight loss.  Genitourinary: Negative for frequency.  Endo/Heme/Allergies: Negative for polydipsia.       Positive polyphagia  Psychiatric/Behavioral: Positive for depression. Negative for suicidal ideas.    PHYSICAL EXAM: Blood pressure 121/80, pulse (!) 129, temperature 98.4 F (36.9 C), temperature source Oral, height 5\' 4"  (1.626 m), weight 203 lb (92.1 kg), last menstrual period 01/07/2019, SpO2 96 %, not currently breastfeeding. Body mass index is 34.84 kg/m. Physical Exam Vitals signs reviewed.  Constitutional:      Appearance: Normal appearance. She is obese.  Cardiovascular:     Rate and Rhythm: Normal rate.     Pulses: Normal pulses.  Pulmonary:     Effort: Pulmonary effort is normal.     Breath sounds: Normal breath sounds.  Musculoskeletal: Normal range of motion.  Skin:    General: Skin is warm and dry.  Neurological:     Mental Status: She is alert and oriented to person, place, and time.  Psychiatric:  Mood and Affect: Mood  normal.        Behavior: Behavior normal.     RECENT LABS AND TESTS: BMET    Component Value Date/Time   NA 139 09/18/2018 0753   K 4.1 09/18/2018 0753   CL 103 09/18/2018 0753   CO2 22 09/18/2018 0753   GLUCOSE 88 09/18/2018 0753   GLUCOSE 81 10/17/2013 0901   BUN 10 09/18/2018 0753   CREATININE 0.64 09/18/2018 0753   CREATININE 0.55 10/17/2013 0901   CALCIUM 9.0 09/18/2018 0753   GFRNONAA 117 09/18/2018 0753   GFRAA 135 09/18/2018 0753   Lab Results  Component Value Date   HGBA1C 5.5 09/18/2018   HGBA1C 5.4 05/15/2018   HGBA1C 5.2 04/24/2015   HGBA1C 5.4 10/02/2012   Lab Results  Component Value Date   INSULIN 17.0 09/18/2018   INSULIN 13.7 05/15/2018   CBC    Component Value Date/Time   WBC 12.3 (H) 10/27/2016 1653   WBC 6.7 10/17/2013 0901   RBC 4.69 10/27/2016 1653   RBC 4.67 10/17/2013 0901   HGB 13.3 10/27/2016 1653   HCT 40.5 10/27/2016 1653   PLT 434 (H) 10/27/2016 1653   MCV 86 10/27/2016 1653   MCH 28.4 10/27/2016 1653   MCH 29.1 10/17/2013 0901   MCHC 32.8 10/27/2016 1653   MCHC 35.1 10/17/2013 0901   RDW 13.1 10/27/2016 1653   Iron/TIBC/Ferritin/ %Sat No results found for: IRON, TIBC, FERRITIN, IRONPCTSAT Lipid Panel     Component Value Date/Time   CHOL 252 (H) 02/16/2018 0812   TRIG 100 02/16/2018 0812   HDL 43 02/16/2018 0812   CHOLHDL 5.9 (H) 02/16/2018 0812   CHOLHDL 4.4 10/17/2013 0901   VLDL 16 10/17/2013 0901   LDLCALC 189 (H) 02/16/2018 0812   Hepatic Function Panel     Component Value Date/Time   PROT 6.7 09/18/2018 0753   ALBUMIN 4.3 09/18/2018 0753   AST 24 09/18/2018 0753   ALT 32 09/18/2018 0753   ALKPHOS 138 (H) 09/18/2018 0753   BILITOT 0.6 09/18/2018 0753   BILIDIR 0.14 02/16/2018 0812      Component Value Date/Time   TSH 2.480 09/18/2018 0753   TSH 2.110 05/15/2018 1203   TSH 2.690 02/16/2018 0953      OBESITY BEHAVIORAL INTERVENTION VISIT  Today's visit was # 14   Starting weight: 204 lbs Starting  date: 05/15/2018 Today's weight : 203 lbs Today's date: 02/07/2019 Total lbs lost to date: 1    ASK: We discussed the diagnosis of obesity with [name] today and Hailey Holden agreed to give [name] permission to discuss obesity behavioral modification therapy today.  ASSESS: Cassidie has the diagnosis of obesity and her BMI today is 34.83 Makenize is in the action stage of change   ADVISE: Ashlin was educated on the multiple health risks of obesity as well as the benefit of weight loss to improve her health. She was advised of the need for long term treatment and the importance of lifestyle modifications to improve her current health and to decrease her risk of future health problems.  AGREE: Multiple dietary modification options and treatment options were discussed and  Hazely agreed to follow the recommendations documented in the above note.  ARRANGE: Sonnet was educated on the importance of frequent visits to treat obesity as outlined per CMS and USPSTF guidelines and agreed to schedule her next follow up appointment today.  Trude Mcburney, am acting as transcriptionist for Quillian Quince, MD  I have  reviewed the above documentation for accuracy and completeness, and I agree with the above. -Dennard Nip, MD

## 2019-02-12 ENCOUNTER — Other Ambulatory Visit (INDEPENDENT_AMBULATORY_CARE_PROVIDER_SITE_OTHER): Payer: Self-pay

## 2019-02-12 ENCOUNTER — Encounter (INDEPENDENT_AMBULATORY_CARE_PROVIDER_SITE_OTHER): Payer: Self-pay | Admitting: Family Medicine

## 2019-02-12 DIAGNOSIS — Z6834 Body mass index (BMI) 34.0-34.9, adult: Secondary | ICD-10-CM

## 2019-02-12 DIAGNOSIS — E6609 Other obesity due to excess calories: Secondary | ICD-10-CM

## 2019-02-12 MED ORDER — SAXENDA 18 MG/3ML ~~LOC~~ SOPN: 3.0000 mg | PEN_INJECTOR | Freq: Every day | SUBCUTANEOUS | 0 refills | Status: DC

## 2019-02-12 MED ORDER — BD PEN NEEDLE NANO 2ND GEN 32G X 4 MM MISC: 1.0000 | Freq: Two times a day (BID) | 0 refills | Status: DC

## 2019-02-12 MED FILL — UNIFINE PENTIPS 32GX5/32": 32G X 4 MM | 50 days supply | Qty: 100 | Fill #0

## 2019-02-12 MED FILL — UNIFINE PENTIPS 32GX5/32: 32G X 4 MM | 50 days supply | Qty: 100 | Fill #0

## 2019-02-12 MED FILL — SAXENDA 18 MG/3 ML PEN: 18 | 30 days supply | Qty: 15 | Fill #0

## 2019-02-18 MED FILL — HYDROCHLOROTHIAZIDE 12.5 MG: 12.5 | 90 days supply | Qty: 90 | Fill #1

## 2019-02-21 ENCOUNTER — Encounter (INDEPENDENT_AMBULATORY_CARE_PROVIDER_SITE_OTHER): Payer: Self-pay | Admitting: Physician Assistant

## 2019-02-21 ENCOUNTER — Telehealth (INDEPENDENT_AMBULATORY_CARE_PROVIDER_SITE_OTHER): Payer: No Typology Code available for payment source | Admitting: Physician Assistant

## 2019-02-21 ENCOUNTER — Other Ambulatory Visit: Payer: Self-pay

## 2019-02-21 DIAGNOSIS — E6609 Other obesity due to excess calories: Secondary | ICD-10-CM | POA: Diagnosis not present

## 2019-02-21 DIAGNOSIS — E038 Other specified hypothyroidism: Secondary | ICD-10-CM

## 2019-02-21 DIAGNOSIS — Z6834 Body mass index (BMI) 34.0-34.9, adult: Secondary | ICD-10-CM | POA: Diagnosis not present

## 2019-02-26 MED FILL — LEVOTHYROXINE 25 MCG TABLET: 25 | 90 days supply | Qty: 90 | Fill #1

## 2019-02-26 NOTE — Progress Notes (Signed)
Office: [phone]  /  Fax: [phone] TeleHealth Visit:  Hailey Holden has verbally consented to this TeleHealth visit today. The patient is located at home, the provider is located at the [facility] office. The participants in this visit include the listed provider and patient and any and all parties involved. The visit was conducted today via FaceTime.  HPI:   Chief Complaint: OBESITY Hailey Holden is here to discuss her progress with her obesity treatment plan. She is on the Category 3 plan and is following her eating plan approximately 85 % of the time. She states she is exercising 0 minutes 0 times per week. Hailey Holden's most recent weight is 201 pounds (02/21/19). She reports that she does not like to eat a lot of meat and she has problems getting all of her protein in daily. We were unable to weigh the patient today for this TeleHealth visit. She feels as if she has maintained weight since her last visit. She has lost 1 lb since starting treatment with [name].  Hypothyroidism Hailey Holden has a diagnosis of hypothyroidism. She is on levothyroxine. She denies excessive fatigue.  ASSESSMENT AND PLAN:  Other specified hypothyroidism  Class 1 obesity due to excess calories with serious comorbidity and body mass index (BMI) of 34.0 to 34.9 in adult  PLAN:  Hypothyroid Hailey Holden was informed of the importance of good thyroid control to help with weight loss efforts. She was also informed that supertherapeutic thyroid levels are dangerous and will not improve weight loss results. Hailey Holden will continue with levothyroxine and weight loss and she will follow up as directed.  Obesity Hailey Holden is currently in the action stage of change. As such, her goal is to continue with weight loss efforts She has agreed to keep a food journal with 1300 to 1600 calories and 90 grams of protein daily Hailey Holden has been instructed to work up to a goal of 150 minutes of combined cardio and strengthening  exercise per week for weight loss and overall health benefits. We discussed the following Behavioral Modification Strategies today: keeping healthy foods in the home and work on meal planning and easy cooking plans  Hailey Holden has agreed to follow up with our clinic in 3 weeks. She was informed of the importance of frequent follow up visits to maximize her success with intensive lifestyle modifications for her multiple health conditions.  I spent > than 50% of the 25 minute visit on counseling as documented in the note.   ALLERGIES: Allergies  Allergen Reactions   Clindamycin/Lincomycin Shortness Of Breath and Rash   Tdap [Tetanus-Diphth-Acell Pertussis] Itching    Itching, swelling in the arm, chest tightness, wheezing, headache, SOB   Latex    Penicillins     MEDICATIONS: Current Outpatient Medications on File Prior to Visit  Medication Sig Dispense Refill   albuterol (VENTOLIN HFA) 108 (90 Base) MCG/ACT inhaler INHALE 2 PUFFS BY MOUTH EVERY 4 HOURS AS NEEDED FOR WHEEZING 18 g 6   amLODipine (NORVASC) 10 MG tablet TAKE 1 TABLET BY MOUTH DAILY. 90 tablet 4   buPROPion (WELLBUTRIN SR) 200 MG 12 hr tablet Take 1 tablet (200 mg total) by mouth daily. 30 tablet 0   cetirizine (ZYRTEC) 10 MG tablet Take 10 mg by mouth daily.     Cholecalciferol (VITAMIN D) 125 MCG (5000 UT) CAPS Take 1 capsule by mouth daily.     DULoxetine (CYMBALTA) 60 MG capsule Take 1 capsule (60 mg total) by mouth daily. 90 capsule 1  fluticasone (FLOVENT HFA) 44 MCG/ACT inhaler 2 puffs bid (Patient taking differently: 2 puffs bid prn) 1 Inhaler 5   hydrochlorothiazide (MICROZIDE) 12.5 MG capsule Take 1 capsule (12.5 mg total) by mouth daily. 90 capsule 4   ibuprofen (ADVIL,MOTRIN) 200 MG tablet Take 200 mg by mouth every 6 (six) hours as needed.     Insulin Pen Needle (BD PEN NEEDLE NANO 2ND GEN) 32G X 4 MM MISC 1 Package by Does not apply route 2 (two) times daily. 100 each 0   Insulin Pen Needle (BD PEN  NEEDLE NANO 2ND GEN) 32G X 4 MM MISC 1 Package by Does not apply route 2 (two) times daily. 100 each 0   levothyroxine (SYNTHROID, LEVOTHROID) 25 MCG tablet Take 1 tablet (25 mcg total) by mouth daily before breakfast. 90 tablet 3   Liraglutide -Weight Management (SAXENDA) 18 MG/3ML SOPN Inject 3 mg into the skin daily. 5 pen 0   montelukast (SINGULAIR) 10 MG tablet Take 1 tablet (10 mg total) by mouth at bedtime. 90 tablet 1   silver sulfADIAZINE (SILVADENE) 1 % cream Apply 1 application topically 2 (two) times daily. 50 g 1   vitamin B-12 (CYANOCOBALAMIN) 500 MCG tablet Take 500 mcg by mouth daily.     No current facility-administered medications on file prior to visit.     PAST MEDICAL HISTORY: Past Medical History:  Diagnosis Date   Allergies    Asthma    Back pain    Breast nodule 02/04/2014   Constipation    Constipation    Depression    Dizziness    Dry skin    Easy bruising    Encounter for other contraceptive management 10/21/2014   Enlarged thyroid 09/27/2012   Fatigue    Fatty liver    Fibromyalgia    Floaters in visual field    Glands swollen    Hashimoto's disease    Headache    Heat intolerance    Hidradenitis    High cholesterol    Hoarseness    Hypertension    Hypothyroidism    IBS (irritable bowel syndrome)    Joint pain    Leg cramping    Migraine headache    Muscle stiffness    Myalgia    Nausea    PMS (premenstrual syndrome) 09/27/2012   Stress    Swelling of lower extremity    Tachycardia    patient states she has long history of resting tachycardia   Thyroid nodule    Uterine prolapse 10/21/2014   Vaginal irritation 10/08/2015   Vision changes    Wheezing    Yeast infection 10/08/2015    PAST SURGICAL HISTORY: History reviewed. No pertinent surgical history.  SOCIAL HISTORY: Social History   Tobacco Use   Smoking status: Never Smoker   Smokeless tobacco: Never Used  Substance Use Topics     Alcohol use: No   Drug use: No    FAMILY HISTORY: Family History  Problem Relation Age of Onset   Diabetes Mother    Hypertension Mother    Fibromyalgia Mother    Obesity Mother    Heart disease Father    Hypertension Father    Hyperlipidemia Father    Obesity Father    Sleep apnea Father    Hypertension Maternal Grandmother    Vision loss Maternal Grandmother    Glaucoma Maternal Grandmother    Stroke Maternal Grandmother    Cancer Maternal Grandfather        melonoma  Heart disease Paternal Grandmother    Hypertension Paternal Grandmother    Hyperlipidemia Paternal Grandmother    Other Paternal Grandmother        macular degeneration   Cancer Paternal Grandfather        lung   Cancer Maternal Uncle        lung   Colon cancer Maternal Uncle    Cancer Paternal Aunt        breast   Cancer Maternal Uncle        lung    ROS: Review of Systems  Constitutional: Negative for malaise/fatigue and weight loss.    PHYSICAL EXAM: Pt in no acute distress  RECENT LABS AND TESTS: BMET    Component Value Date/Time   NA 139 09/18/2018 0753   K 4.1 09/18/2018 0753   CL 103 09/18/2018 0753   CO2 22 09/18/2018 0753   GLUCOSE 88 09/18/2018 0753   GLUCOSE 81 10/17/2013 0901   BUN 10 09/18/2018 0753   CREATININE 0.64 09/18/2018 0753   CREATININE 0.55 10/17/2013 0901   CALCIUM 9.0 09/18/2018 0753   GFRNONAA 117 09/18/2018 0753   GFRAA 135 09/18/2018 0753   Lab Results  Component Value Date   HGBA1C 5.5 09/18/2018   HGBA1C 5.4 05/15/2018   HGBA1C 5.2 04/24/2015   HGBA1C 5.4 10/02/2012   Lab Results  Component Value Date   INSULIN 17.0 09/18/2018   INSULIN 13.7 05/15/2018   CBC    Component Value Date/Time   WBC 12.3 (H) 10/27/2016 1653   WBC 6.7 10/17/2013 0901   RBC 4.69 10/27/2016 1653   RBC 4.67 10/17/2013 0901   HGB 13.3 10/27/2016 1653   HCT 40.5 10/27/2016 1653   PLT 434 (H) 10/27/2016 1653   MCV 86 10/27/2016 1653   MCH  28.4 10/27/2016 1653   MCH 29.1 10/17/2013 0901   MCHC 32.8 10/27/2016 1653   MCHC 35.1 10/17/2013 0901   RDW 13.1 10/27/2016 1653   Iron/TIBC/Ferritin/ %Sat No results found for: IRON, TIBC, FERRITIN, IRONPCTSAT Lipid Panel     Component Value Date/Time   CHOL 252 (H) 02/16/2018 0812   TRIG 100 02/16/2018 0812   HDL 43 02/16/2018 0812   CHOLHDL 5.9 (H) 02/16/2018 0812   CHOLHDL 4.4 10/17/2013 0901   VLDL 16 10/17/2013 0901   LDLCALC 189 (H) 02/16/2018 0812   Hepatic Function Panel     Component Value Date/Time   PROT 6.7 09/18/2018 0753   ALBUMIN 4.3 09/18/2018 0753   AST 24 09/18/2018 0753   ALT 32 09/18/2018 0753   ALKPHOS 138 (H) 09/18/2018 0753   BILITOT 0.6 09/18/2018 0753   BILIDIR 0.14 02/16/2018 0812      Component Value Date/Time   TSH 2.480 09/18/2018 0753   TSH 2.110 05/15/2018 1203   TSH 2.690 02/16/2018 0953     Ref. Range 09/18/2018 07:53  Vitamin D, 25-Hydroxy Latest Ref Range: 30.0 - 100.0 ng/mL 79.2    I, [place] CraneJoanne Murray, am acting as Energy managertranscriptionist for [employer]y Aguilar, PA-C I, Alois Clicheracey Aguilar, PA-C have reviewed above note and agree with its content

## 2019-03-04 ENCOUNTER — Encounter: Payer: Self-pay | Admitting: Nurse Practitioner

## 2019-03-04 ENCOUNTER — Other Ambulatory Visit: Payer: Self-pay | Admitting: Adult Health

## 2019-03-04 MED FILL — SULFAMETHOXAZOLE-TMP DS TAB: 800-160 | 14 days supply | Qty: 28 | Fill #0

## 2019-03-05 ENCOUNTER — Other Ambulatory Visit: Payer: Self-pay | Admitting: Nurse Practitioner

## 2019-03-05 DIAGNOSIS — R5383 Other fatigue: Secondary | ICD-10-CM

## 2019-03-05 DIAGNOSIS — M2550 Pain in unspecified joint: Secondary | ICD-10-CM

## 2019-03-06 ENCOUNTER — Encounter: Payer: Self-pay | Admitting: Family Medicine

## 2019-03-11 ENCOUNTER — Encounter (INDEPENDENT_AMBULATORY_CARE_PROVIDER_SITE_OTHER): Payer: Self-pay

## 2019-03-13 ENCOUNTER — Other Ambulatory Visit: Payer: Self-pay

## 2019-03-13 ENCOUNTER — Telehealth (INDEPENDENT_AMBULATORY_CARE_PROVIDER_SITE_OTHER): Payer: No Typology Code available for payment source | Admitting: Family Medicine

## 2019-03-13 ENCOUNTER — Encounter (INDEPENDENT_AMBULATORY_CARE_PROVIDER_SITE_OTHER): Payer: Self-pay | Admitting: Family Medicine

## 2019-03-13 ENCOUNTER — Ambulatory Visit (INDEPENDENT_AMBULATORY_CARE_PROVIDER_SITE_OTHER): Payer: No Typology Code available for payment source | Admitting: Family Medicine

## 2019-03-13 DIAGNOSIS — Z6834 Body mass index (BMI) 34.0-34.9, adult: Secondary | ICD-10-CM

## 2019-03-13 DIAGNOSIS — E6609 Other obesity due to excess calories: Secondary | ICD-10-CM | POA: Diagnosis not present

## 2019-03-13 DIAGNOSIS — E8881 Metabolic syndrome: Secondary | ICD-10-CM | POA: Diagnosis not present

## 2019-03-13 NOTE — Progress Notes (Signed)
Office: [phone]  /  Fax: [phone] TeleHealth Visit:  Hailey Holden has verbally consented to this TeleHealth visit today. The patient is located at work, the provider is located at the [facility] office. The participants in this visit include the listed provider and patient. The visit was conducted today via telephone call (FaceTime failed - changed to telephone call).  HPI:   Chief Complaint: OBESITY Hailey Holden is here to discuss her progress with her obesity treatment plan. She is on the Category 3 plan and is following her eating plan approximately 25% of the time. She states she is exercising 0 minutes 0 times per week. Hailey Holden feels she has done well maintaining her weight even over Thanksgiving. She has gotten off track since then, but has been working on [employer]. She is on Saxenda. We were unable to weigh the patient today for this TeleHealth visit. She feels as if she has maintained her weight since her last visit. She has lost 1 lb since starting treatment with [name].  Insulin Resistance Hailey Holden has a diagnosis of insulin resistance based on her elevated fasting insulin level >5. Although Hailey Holden's blood glucose readings are still under good control, insulin resistance puts her at greater risk of metabolic syndrome and diabetes. She is doing well on Saxenda and diet. No nausea or vomiting. She has decreased simple carbohydrates and has decreased polyphagia, but she still struggles at times.  ASSESSMENT AND PLAN:  Insulin resistance  Class 1 obesity due to excess calories with serious comorbidity and body mass index (BMI) of 34.0 to 34.9 in adult  PLAN:  Insulin Resistance Hailey Holden will continue to work on weight loss, exercise, and decreasing simple carbohydrates to help decrease the risk of diabetes. Hailey Holden will increase Saxenda to 1.2 (no refill needed). She agrees to follow-up with our clinic in 3-4 weeks.  I spent > than 50% of the 20 minute visit on counseling as  documented in the note.  TIME SPENT: 15 minutes   Obesity Hailey Holden is currently in the action stage of change. As such, her goal is to continue with weight loss efforts. She has agreed to follow the Category 3 plan. Hailey Holden has been instructed to work up to a goal of 150 minutes of combined cardio and strengthening exercise per week for weight loss and overall health benefits. We discussed the following Behavioral Modification Strategies today: increasing lean protein intake and holiday eating strategies.  Hailey Holden will increase Saxenda to 1.2 for now and will follow (no refill needed).  Hailey Holden has agreed to follow-up with our clinic in 3 weeks. She was informed of the importance of frequent follow-up visits to maximize her success with intensive lifestyle modifications for her multiple health conditions.  ALLERGIES: Allergies  Allergen Reactions  . Clindamycin/Lincomycin Shortness Of Breath and Rash  . Tdap [Tetanus-Diphth-Acell Pertussis] Itching    Itching, swelling in the arm, chest tightness, wheezing, headache, SOB  . Latex   . Penicillins     MEDICATIONS: Current Outpatient Medications on File Prior to Visit  Medication Sig Dispense Refill  . albuterol (VENTOLIN HFA) 108 (90 Base) MCG/ACT inhaler INHALE 2 PUFFS BY MOUTH EVERY 4 HOURS AS NEEDED FOR WHEEZING 18 g 6  . amLODipine (NORVASC) 10 MG tablet TAKE 1 TABLET BY MOUTH DAILY. 90 tablet 4  . cetirizine (ZYRTEC) 10 MG tablet Take 10 mg by mouth daily.    . Cholecalciferol (VITAMIN D) 125 MCG (5000 UT) CAPS Take 1 capsule by mouth daily.    Marland Kitchen  DULoxetine (CYMBALTA) 60 MG capsule Take 1 capsule (60 mg total) by mouth daily. 90 capsule 1  . fluticasone (FLOVENT HFA) 44 MCG/ACT inhaler 2 puffs bid (Patient taking differently: 2 puffs bid prn) 1 Inhaler 5  . hydrochlorothiazide (MICROZIDE) 12.5 MG capsule Take 1 capsule (12.5 mg total) by mouth daily. 90 capsule 4  . ibuprofen (ADVIL,MOTRIN) 200 MG tablet Take 200 mg by mouth every 6  (six) hours as needed.    . Insulin Pen Needle (BD PEN NEEDLE NANO 2ND GEN) 32G X 4 MM MISC 1 Package by Does not apply route 2 (two) times daily. 100 each 0  . levothyroxine (SYNTHROID, LEVOTHROID) 25 MCG tablet Take 1 tablet (25 mcg total) by mouth daily before breakfast. 90 tablet 3  . Liraglutide -Weight Management (SAXENDA) 18 MG/3ML SOPN Inject 3 mg into the skin daily. 5 pen 0  . montelukast (SINGULAIR) 10 MG tablet Take 1 tablet (10 mg total) by mouth at bedtime. 90 tablet 1  . silver sulfADIAZINE (SILVADENE) 1 % cream Apply 1 application topically 2 (two) times daily. 50 g 1  . sulfamethoxazole-trimethoprim (BACTRIM DS) 800-160 MG tablet TAKE 1 TABLET BY MOUTH 2 TIMES DAILY 28 tablet 3  . vitamin B-12 (CYANOCOBALAMIN) 500 MCG tablet Take 500 mcg by mouth daily.    . Insulin Pen Needle (BD PEN NEEDLE NANO 2ND GEN) 32G X 4 MM MISC 1 Package by Does not apply route 2 (two) times daily. 100 each 0   No current facility-administered medications on file prior to visit.     PAST MEDICAL HISTORY: Past Medical History:  Diagnosis Date  . Allergies   . Asthma   . Back pain   . Breast nodule 02/04/2014  . Constipation   . Constipation   . Depression   . Dizziness   . Dry skin   . Easy bruising   . Encounter for other contraceptive management 10/21/2014  . Enlarged thyroid 09/27/2012  . Fatigue   . Fatty liver   . Fibromyalgia   . Floaters in visual field   . Glands swollen   . Hashimoto's disease   . Headache   . Heat intolerance   . Hidradenitis   . High cholesterol   . Hoarseness   . Hypertension   . Hypothyroidism   . IBS (irritable bowel syndrome)   . Joint pain   . Leg cramping   . Migraine headache   . Muscle stiffness   . Myalgia   . Nausea   . PMS (premenstrual syndrome) 09/27/2012  . Stress   . Swelling of lower extremity   . Tachycardia    patient states she has long history of resting tachycardia  . Thyroid nodule   . Uterine prolapse 10/21/2014  . Vaginal  irritation 10/08/2015  . Vision changes   . Wheezing   . Yeast infection 10/08/2015    PAST SURGICAL HISTORY: History reviewed. No pertinent surgical history.  SOCIAL HISTORY: Social History   Tobacco Use  . Smoking status: Never Smoker  . Smokeless tobacco: Never Used  Substance Use Topics  . Alcohol use: No  . Drug use: No    FAMILY HISTORY: Family History  Problem Relation Age of Onset  . Diabetes Mother   . Hypertension Mother   . Fibromyalgia Mother   . Obesity Mother   . Heart disease Father   . Hypertension Father   . Hyperlipidemia Father   . Obesity Father   . Sleep apnea Father   .  Hypertension Maternal Grandmother   . Vision loss Maternal Grandmother   . Glaucoma Maternal Grandmother   . Stroke Maternal Grandmother   . Cancer Maternal Grandfather        melonoma  . Heart disease Paternal Grandmother   . Hypertension Paternal Grandmother   . Hyperlipidemia Paternal Grandmother   . Other Paternal Grandmother        macular degeneration  . Cancer Paternal Grandfather        lung  . Cancer Maternal Uncle        lung  . Colon cancer Maternal Uncle   . Cancer Paternal Aunt        breast  . Cancer Maternal Uncle        lung   ROS: Review of Systems  Gastrointestinal: Negative for nausea and vomiting.  Endo/Heme/Allergies:       Positive for decreased polyphagia.   PHYSICAL EXAM: Pt in no acute distress  RECENT LABS AND TESTS: BMET    Component Value Date/Time   NA 139 09/18/2018 0753   K 4.1 09/18/2018 0753   CL 103 09/18/2018 0753   CO2 22 09/18/2018 0753   GLUCOSE 88 09/18/2018 0753   GLUCOSE 81 10/17/2013 0901   BUN 10 09/18/2018 0753   CREATININE 0.64 09/18/2018 0753   CREATININE 0.55 10/17/2013 0901   CALCIUM 9.0 09/18/2018 0753   GFRNONAA 117 09/18/2018 0753   GFRAA 135 09/18/2018 0753   Lab Results  Component Value Date   HGBA1C 5.5 09/18/2018   HGBA1C 5.4 05/15/2018   HGBA1C 5.2 04/24/2015   HGBA1C 5.4 10/02/2012   Lab  Results  Component Value Date   INSULIN 17.0 09/18/2018   INSULIN 13.7 05/15/2018   CBC    Component Value Date/Time   WBC 12.3 (H) 10/27/2016 1653   WBC 6.7 10/17/2013 0901   RBC 4.69 10/27/2016 1653   RBC 4.67 10/17/2013 0901   HGB 13.3 10/27/2016 1653   HCT 40.5 10/27/2016 1653   PLT 434 (H) 10/27/2016 1653   MCV 86 10/27/2016 1653   MCH 28.4 10/27/2016 1653   MCH 29.1 10/17/2013 0901   MCHC 32.8 10/27/2016 1653   MCHC 35.1 10/17/2013 0901   RDW 13.1 10/27/2016 1653   Iron/TIBC/Ferritin/ %Sat No results found for: IRON, TIBC, FERRITIN, IRONPCTSAT Lipid Panel     Component Value Date/Time   CHOL 252 (H) 02/16/2018 0812   TRIG 100 02/16/2018 0812   HDL 43 02/16/2018 0812   CHOLHDL 5.9 (H) 02/16/2018 0812   CHOLHDL 4.4 10/17/2013 0901   VLDL 16 10/17/2013 0901   LDLCALC 189 (H) 02/16/2018 0812   Hepatic Function Panel     Component Value Date/Time   PROT 6.7 09/18/2018 0753   ALBUMIN 4.3 09/18/2018 0753   AST 24 09/18/2018 0753   ALT 32 09/18/2018 0753   ALKPHOS 138 (H) 09/18/2018 0753   BILITOT 0.6 09/18/2018 0753   BILIDIR 0.14 02/16/2018 0812      Component Value Date/Time   TSH 2.480 09/18/2018 0753   TSH 2.110 05/15/2018 1203   TSH 2.690 02/16/2018 0953   Results for Richardson Hailey Holden, Hailey Holden (MRN [id number]) as of 03/13/2019 15:23  Ref. Range 09/18/2018 07:53  Vitamin D, 25-Hydroxy Latest Ref Range: 30.0 - 100.0 ng/mL 79.2   I, Marianna Paymentenise Haag, am acting as Energy managertranscriptionist for Quillian Quincearen Azrael Maddix, MD I have reviewed the above documentation for accuracy and completeness, and I agree with the above. -Quillian Quincearen Maitri Schnoebelen, MD

## 2019-03-21 MED FILL — ALBUTEROL SULFATE HFA 108 (: 108 (90 BAS | 17 days supply | Qty: 9 | Fill #1

## 2019-04-09 ENCOUNTER — Other Ambulatory Visit: Payer: Self-pay

## 2019-04-09 ENCOUNTER — Encounter (INDEPENDENT_AMBULATORY_CARE_PROVIDER_SITE_OTHER): Payer: Self-pay | Admitting: Family Medicine

## 2019-04-09 ENCOUNTER — Ambulatory Visit (INDEPENDENT_AMBULATORY_CARE_PROVIDER_SITE_OTHER): Payer: No Typology Code available for payment source | Admitting: Family Medicine

## 2019-04-09 VITALS — BP 122/83 | HR 99 | Temp 98.4°F | Ht 64.0 in | Wt 201.0 lb

## 2019-04-09 DIAGNOSIS — Z6834 Body mass index (BMI) 34.0-34.9, adult: Secondary | ICD-10-CM | POA: Diagnosis not present

## 2019-04-09 DIAGNOSIS — E559 Vitamin D deficiency, unspecified: Secondary | ICD-10-CM | POA: Diagnosis not present

## 2019-04-09 DIAGNOSIS — E669 Obesity, unspecified: Secondary | ICD-10-CM

## 2019-04-09 MED ORDER — BD PEN NEEDLE NANO 2ND GEN 32G X 4 MM MISC: 1.0000 | Freq: Two times a day (BID) | 0 refills | Status: DC

## 2019-04-10 MED FILL — UNIFINE PENTIPS 32GX5/32: 32G X 4 MM | 50 days supply | Qty: 100 | Fill #0

## 2019-04-10 MED FILL — UNIFINE PENTIPS 32GX5/32": 32G X 4 MM | 50 days supply | Qty: 100 | Fill #0

## 2019-04-11 NOTE — Progress Notes (Signed)
Chief Complaint: OBESITY Hailey Holden is here to discuss her progress with her obesity treatment plan along with follow-up of her obesity related diagnoses. Hailey Holden is on the Category 3 Plan and states she is following her eating plan approximately 25% of the time. Hailey Holden states she is doing 0 minutes 0 times per week.  Today's visit was #: 17 Starting weight: 204 lbs Starting date: 05/15/2018 Today's weight: 201 lbs Today's date: 04/09/2019 Total lbs lost to date: 3 Total lbs lost since last in-office visit: 2  Interim History: Hailey Holden did well with weight loss even over the holidays. She changed to journaling and tried to meet her calories and protein goals. She is working on meal planning and prepping. She increased her Saxenda to 0.9 mg.  Subjective:   1. Vitamin D deficiency Hailey Holden is stable on Vit D and she is due to have labs checked soon.  Assessment/Plan:   1. Vitamin D deficiency Hailey Holden agrees to continue Vit D and we will recheck labs at her next visit.  2. Class 1 obesity with serious comorbidity and body mass index (BMI) of 34.0 to 34.9 in adult, unspecified obesity type We will refill nano needles- Insulin Pen Needle (BD PEN NEEDLE NANO 2ND GEN) 32G X 4 MM MISC; 1 Package by Does not apply route 2 (two) times daily.  Dispense: 100 each; Refill: 0.  Hailey Holden is currently in the action stage of change. As such, her goal is to continue with weight loss efforts. She has agreed to keeping a food journal and adhering to recommended goals of 1500 calories and 90+ grams of protein daily.   We discussed the following exercise goals today: For substantial health benefits, adults should do at least 150 minutes (2 hours and 30 minutes) a week of moderate-intensity, or 75 minutes (1 hour and 15 minutes) a week of vigorous-intensity aerobic physical activity, or an equivalent combination of moderate- and vigorous-intensity aerobic activity. Aerobic activity should be performed in  episodes of at least 10 minutes, and preferably, it should be spread throughout the week. Adults should also include muscle-strengthening activities that involve all major muscle groups on 2 or more days a week.  We discussed the following behavioral modification strategies today: meal planning and cooking strategies and keeping a strict food journal.  Hailey Holden has agreed to follow-up with our clinic in 4 weeks. She was informed of the importance of frequent follow-up visits to maximize her success with intensive lifestyle modifications for her multiple health conditions.  Objective:   Blood pressure 122/83, pulse 99, temperature 98.4 F (36.9 C), temperature source Oral, height 5\' 4"  (1.626 m), weight 201 lb (91.2 kg), last menstrual period 03/09/2019, SpO2 96 %, not currently breastfeeding. Body mass index is 34.5 kg/m.  General: Cooperative, alert, well developed, in no acute distress. HEENT: Conjunctivae and lids unremarkable. Neck: No thyromegaly.  Cardiovascular: Regular rhythm.  Lungs: Normal work of breathing. Extremities: No edema.  Neurologic: No focal deficits.   Lab Results  Component Value Date   CREATININE 0.64 09/18/2018   BUN 10 09/18/2018   NA 139 09/18/2018   K 4.1 09/18/2018   CL 103 09/18/2018   CO2 22 09/18/2018   Lab Results  Component Value Date   ALT 32 09/18/2018   AST 24 09/18/2018   ALKPHOS 138 (H) 09/18/2018   BILITOT 0.6 09/18/2018   Lab Results  Component Value Date   HGBA1C 5.5 09/18/2018   HGBA1C 5.4 05/15/2018   HGBA1C  5.2 04/24/2015   HGBA1C 5.4 10/02/2012   Lab Results  Component Value Date   INSULIN 17.0 09/18/2018   INSULIN 13.7 05/15/2018   Lab Results  Component Value Date   TSH 2.480 09/18/2018   Lab Results  Component Value Date   CHOL 252 (H) 02/16/2018   HDL 43 02/16/2018   LDLCALC 189 (H) 02/16/2018   TRIG 100 02/16/2018   CHOLHDL 5.9 (H) 02/16/2018   Lab Results  Component Value Date   WBC 12.3 (H)  10/27/2016   HGB 13.3 10/27/2016   HCT 40.5 10/27/2016   MCV 86 10/27/2016   PLT 434 (H) 10/27/2016   No results found for: IRON, TIBC, FERRITIN  Attestation Statements:   Reviewed by clinician on day of visit: allergies, medications, problem list, medical history, surgical history, family history, social history and previous encounter notes.  This visit occurred during the SARS-CoV-2 public health emergency. Safety protocols were in place, including screening questions prior to the visit, additional usage of staff PPE, and extensive cleaning of exam room while observing appropriate contact time as indicated for disinfecting solutions. (CPT [id number])  Total time spent on day of encounter: 30. 04/09/2019 Trixie Dredge, [place], Trixie Dredge, am acting as transcriptionist for Dennard Nip, MD.  I have reviewed the above documentation for accuracy and completeness, and I agree with the above. -  Dennard Nip, MD

## 2019-04-16 ENCOUNTER — Other Ambulatory Visit: Payer: Self-pay | Admitting: Family Medicine

## 2019-04-16 MED FILL — AMLODIPINE BESYLATE 10 MG T: 10 | 90 days supply | Qty: 90 | Fill #1

## 2019-04-17 MED FILL — DULoxetine HCL 60 MG CPEP: 60 | 90 days supply | Qty: 90 | Fill #0

## 2019-04-19 ENCOUNTER — Other Ambulatory Visit: Payer: Self-pay

## 2019-04-19 ENCOUNTER — Encounter: Payer: Self-pay | Admitting: Nurse Practitioner

## 2019-04-19 ENCOUNTER — Other Ambulatory Visit: Payer: Self-pay | Admitting: Nurse Practitioner

## 2019-04-19 ENCOUNTER — Encounter (INDEPENDENT_AMBULATORY_CARE_PROVIDER_SITE_OTHER): Payer: No Typology Code available for payment source | Admitting: Nurse Practitioner

## 2019-04-19 MED ORDER — DULOXETINE HCL 60 MG PO CPEP: 60.0000 mg | ORAL_CAPSULE | Freq: Every day | ORAL | 1 refills | Status: DC

## 2019-04-19 NOTE — Progress Notes (Signed)
   Subjective:    Patient ID: Hailey Holden, female    DOB: Oct 13, 1985, 34 y.o.   MRN: [id number]  Hypertension This is a chronic problem. The current episode started more than 1 year ago. Treatments tried: norvasc. There are no compliance problems.    Patient would like to discuss her visit with rheumatology   Review of Systems   Virtual Visit via Video Note  I connected with Hailey Holden on 04/19/19 at 11:20 AM EST by a video enabled telemedicine application and verified that I am speaking with the correct person using two identifiers.  Location: Patient: home Provider: office   I discussed the limitations of evaluation and management by telemedicine and the availability of in person appointments. The patient expressed understanding and agreed to proceed.  History of Present Illness:    Observations/Objective:   Assessment and Plan:   Follow Up Instructions:    I discussed the assessment and treatment plan with the patient. The patient was provided an opportunity to ask questions and all were answered. The patient agreed with the plan and demonstrated an understanding of the instructions.   The patient was advised to call back or seek an in-person evaluation if the symptoms worsen or if the condition fails to improve as anticipated.  I provided 0 minutes of non-face-to-face time during this encounter.  Unable to connect with patient electronically.       Objective:   Physical Exam        Assessment & Plan:

## 2019-04-22 ENCOUNTER — Encounter: Payer: Self-pay | Admitting: Nurse Practitioner

## 2019-04-22 MED FILL — MONTELUKAST SOD 10 MG TAB: 10 | 90 days supply | Qty: 90 | Fill #1

## 2019-04-26 NOTE — Progress Notes (Signed)
This encounter was created in error - please disregard.

## 2019-05-01 ENCOUNTER — Encounter (INDEPENDENT_AMBULATORY_CARE_PROVIDER_SITE_OTHER): Payer: Self-pay | Admitting: Family Medicine

## 2019-05-01 NOTE — Telephone Encounter (Signed)
Please advise 

## 2019-05-02 ENCOUNTER — Encounter: Payer: Self-pay | Admitting: Nurse Practitioner

## 2019-05-05 ENCOUNTER — Encounter (INDEPENDENT_AMBULATORY_CARE_PROVIDER_SITE_OTHER): Payer: Self-pay | Admitting: Family Medicine

## 2019-05-06 ENCOUNTER — Encounter: Payer: Self-pay | Admitting: Family Medicine

## 2019-05-06 ENCOUNTER — Ambulatory Visit (INDEPENDENT_AMBULATORY_CARE_PROVIDER_SITE_OTHER): Payer: No Typology Code available for payment source | Admitting: Family Medicine

## 2019-05-09 ENCOUNTER — Encounter (INDEPENDENT_AMBULATORY_CARE_PROVIDER_SITE_OTHER): Payer: Self-pay | Admitting: Family Medicine

## 2019-05-09 NOTE — Telephone Encounter (Signed)
FYI for her next appt

## 2019-05-17 MED FILL — ALBUTEROL SULFATE HFA 108 (: 108 (90 BAS | 17 days supply | Qty: 9 | Fill #2

## 2019-05-17 MED FILL — HYDROCHLOROTHIAZIDE 12.5 MG: 12.5 | 90 days supply | Qty: 90 | Fill #2

## 2019-05-28 ENCOUNTER — Encounter (INDEPENDENT_AMBULATORY_CARE_PROVIDER_SITE_OTHER): Payer: Self-pay | Admitting: Family Medicine

## 2019-05-28 ENCOUNTER — Ambulatory Visit (INDEPENDENT_AMBULATORY_CARE_PROVIDER_SITE_OTHER): Payer: No Typology Code available for payment source | Admitting: Family Medicine

## 2019-05-28 ENCOUNTER — Other Ambulatory Visit: Payer: Self-pay

## 2019-05-28 VITALS — BP 102/72 | HR 89 | Temp 98.0°F | Ht 64.0 in | Wt 201.0 lb

## 2019-05-28 DIAGNOSIS — E669 Obesity, unspecified: Secondary | ICD-10-CM | POA: Diagnosis not present

## 2019-05-28 DIAGNOSIS — I10 Essential (primary) hypertension: Secondary | ICD-10-CM

## 2019-05-28 DIAGNOSIS — Z9189 Other specified personal risk factors, not elsewhere classified: Secondary | ICD-10-CM | POA: Diagnosis not present

## 2019-05-28 DIAGNOSIS — E038 Other specified hypothyroidism: Secondary | ICD-10-CM | POA: Diagnosis not present

## 2019-05-28 DIAGNOSIS — Z6834 Body mass index (BMI) 34.0-34.9, adult: Secondary | ICD-10-CM

## 2019-05-28 NOTE — Progress Notes (Signed)
Chief Complaint:   OBESITY Hailey Holden is here to discuss her progress with her obesity treatment plan along with follow-up of her obesity related diagnoses. Hailey Holden is on the Category 3 Plan and keeping a food journal of 1500 calories and 90 grams of protein daily and states she is following her eating plan approximately 40% of the time. Hailey Holden states she is doing cardio exercise, 30 to 100 minutes 2 times per week.  Today's visit was #: 18 Starting weight: 204 lbs Starting date: 05/15/2018 Today's weight: 201 lbs Today's date: 05/28/2019 Total lbs lost to date: 3 Total lbs lost since last in-office visit: 0  Interim History: Hailey Holden voices that the last two weeks, she has tried to be more conscious on journaling, and keeping track in her head. Journaling was to help patient keep track of her protein.  Subjective:   Essential hypertension  Hailey Holden's blood pressure is controlled today. She denies chest pain, chest pressure or headache. Hailey Holden is on 10 mg Amlodipine and 12.5 mg of HCTZ.  BP Readings from Last 3 Encounters:  05/28/19 102/72  04/09/19 122/83  02/07/19 121/80   Lab Results  Component Value Date   CREATININE 0.64 09/18/2018   CREATININE 0.61 05/15/2018   CREATININE 0.66 09/27/2017   Other specified hypothyroidism  Hailey Holden is on 25 mcg PO daily of levothyroxine. She denies cold intolerance, heat intolerance or palpitations.  Lab Results  Component Value Date   TSH 2.480 09/18/2018   At risk for impaired function of liver Hailey Holden is at risk for impaired function of liver due to likely diagnosis of fatty liver as evidenced by recent elevated liver enzymes.   Assessment/Plan:   Essential hypertension  [name] is working on healthy weight loss and exercise to improve blood pressure control. We will plan to decrease blood pressure medication at the next appointment if her blood pressure is controlled. We will check CMP today. We will watch for signs of  hypotension as she continues her lifestyle modifications.  Other specified hypothyroidism  Patient with long-standing hypothyroidism, on levothyroxine therapy. She appears euthyroid. We will check TSH today. Orders and follow up as documented in patient record.  Counseling . Good thyroid control is important for overall health. Supratherapeutic thyroid levels are dangerous and will not improve weight loss results. . The correct way to take levothyroxine is fasting, with water, separated by at least 30 minutes from breakfast, and separated by more than 4 hours from calcium, iron, multivitamins, acid reflux medications (PPIs).   At risk for impaired function of liver Hailey Holden was given approximately 15 minutes of counseling today regarding prevention of impaired liver function. Hailey Holden was educated about her risk of developing NASH or even liver failure and advised that the only proven treatment for NAFLD was weight loss of at least 5-10% of body weight.   Class 1 obesity with serious comorbidity and body mass index (BMI) of 34.0 to 34.9 in adult, unspecified obesity type Hailey Holden is currently in the action stage of change. As such, her goal is to continue with weight loss efforts. She has agreed to the Category 3 Plan.   Behavioral modification strategies: increasing lean protein intake, increasing vegetables, meal planning and cooking strategies, keeping healthy foods in the home and planning for success.  Hailey Holden agrees to continue Saxenda and increase the dose to 1.2 mg sub Q daily #3 pens with no refills.  Hailey Holden has agreed to follow-up with our clinic in 2 weeks. She was informed of  the importance of frequent follow-up visits to maximize her success with intensive lifestyle modifications for her multiple health conditions.   Hailey Holden was informed we would discuss her lab results at her next visit unless there is a critical issue that needs to be addressed sooner. Hailey Holden agreed to keep her next visit  at the agreed upon time to discuss these results.  Objective:   Blood pressure 102/72, pulse 89, temperature 98 F (36.7 C), temperature source Oral, height 5\' 4"  (1.626 m), weight 201 lb (91.2 kg), last menstrual period 05/15/2019, SpO2 96 %, not currently breastfeeding. Body mass index is 34.5 kg/m.  General: Cooperative, alert, well developed, in no acute distress. HEENT: Conjunctivae and lids unremarkable. Cardiovascular: Regular rhythm.  Lungs: Normal work of breathing. Neurologic: No focal deficits.   Lab Results  Component Value Date   CREATININE 0.64 09/18/2018   BUN 10 09/18/2018   NA 139 09/18/2018   K 4.1 09/18/2018   CL 103 09/18/2018   CO2 22 09/18/2018   Lab Results  Component Value Date   ALT 32 09/18/2018   AST 24 09/18/2018   ALKPHOS 138 (H) 09/18/2018   BILITOT 0.6 09/18/2018   Lab Results  Component Value Date   HGBA1C 5.5 09/18/2018   HGBA1C 5.4 05/15/2018   HGBA1C 5.2 04/24/2015   HGBA1C 5.4 10/02/2012   Lab Results  Component Value Date   INSULIN 17.0 09/18/2018   INSULIN 13.7 05/15/2018   Lab Results  Component Value Date   TSH 2.480 09/18/2018   Lab Results  Component Value Date   CHOL 252 (H) 02/16/2018   HDL 43 02/16/2018   LDLCALC 189 (H) 02/16/2018   TRIG 100 02/16/2018   CHOLHDL 5.9 (H) 02/16/2018   Lab Results  Component Value Date   WBC 12.3 (H) 10/27/2016   HGB 13.3 10/27/2016   HCT 40.5 10/27/2016   MCV 86 10/27/2016   PLT 434 (H) 10/27/2016   No results found for: IRON, TIBC, FERRITIN   Ref. Range 09/18/2018 07:53  Vitamin D, 25-Hydroxy Latest Ref Range: 30.0 - 100.0 ng/mL 79.2    Attestation Statements:   Reviewed by clinician on day of visit: allergies, medications, problem list, medical history, surgical history, family history, social history, and previous encounter notes.  I, Doreene Nest, am acting as transcriptionist for Coralie Common, MD.  I have reviewed the above documentation for accuracy and  completeness, and I agree with the above. - Ilene Qua, MD

## 2019-05-29 LAB — COMPREHENSIVE METABOLIC PANEL
ALT: 22 IU/L (ref 0–32)
AST: 17 IU/L (ref 0–40)
Albumin/Globulin Ratio: 1.5 (ref 1.2–2.2)
Albumin: 4.2 g/dL (ref 3.8–4.8)
Alkaline Phosphatase: 139 IU/L — ABNORMAL HIGH (ref 39–117)
BUN/Creatinine Ratio: 14 (ref 9–23)
BUN: 9 mg/dL (ref 6–20)
Bilirubin Total: 0.7 mg/dL (ref 0.0–1.2)
CO2: 22 mmol/L (ref 20–29)
Calcium: 9.1 mg/dL (ref 8.7–10.2)
Chloride: 102 mmol/L (ref 96–106)
Creatinine, Ser: 0.64 mg/dL (ref 0.57–1.00)
GFR calc Af Amer: 135 mL/min/{1.73_m2} (ref >59)
GFR calc non Af Amer: 117 mL/min/{1.73_m2} (ref >59)
Globulin, Total: 2.8 g/dL (ref 1.5–4.5)
Glucose: 80 mg/dL (ref 65–99)
Potassium: 3.8 mmol/L (ref 3.5–5.2)
Sodium: 141 mmol/L (ref 134–144)
Total Protein: 7 g/dL (ref 6.0–8.5)

## 2019-05-29 LAB — LIPID PANEL WITH LDL/HDL RATIO
Cholesterol, Total: 230 mg/dL — ABNORMAL HIGH (ref 100–199)
HDL: 39 mg/dL — ABNORMAL LOW (ref >39)
LDL Chol Calc (NIH): 177 mg/dL — ABNORMAL HIGH (ref 0–99)
LDL/HDL Ratio: 4.5 ratio — ABNORMAL HIGH (ref 0.0–3.2)
Triglycerides: 80 mg/dL (ref 0–149)
VLDL Cholesterol Cal: 14 mg/dL (ref 5–40)

## 2019-05-29 LAB — HEMOGLOBIN A1C
Est. average glucose Bld gHb Est-mCnc: 103 mg/dL
Hgb A1c MFr Bld: 5.2 % (ref 4.8–5.6)

## 2019-05-29 LAB — TSH: TSH: 1.52 u[IU]/mL (ref 0.450–4.500)

## 2019-05-29 LAB — INSULIN, RANDOM: INSULIN: 14.3 u[IU]/mL (ref 2.6–24.9)

## 2019-06-05 ENCOUNTER — Other Ambulatory Visit: Payer: Self-pay | Admitting: Adult Health

## 2019-06-05 MED FILL — SYNTHROID 25 MCG TABLET: 25 | 90 days supply | Qty: 90 | Fill #0

## 2019-06-06 ENCOUNTER — Other Ambulatory Visit (INDEPENDENT_AMBULATORY_CARE_PROVIDER_SITE_OTHER): Payer: Self-pay | Admitting: Family Medicine

## 2019-06-06 DIAGNOSIS — E6609 Other obesity due to excess calories: Secondary | ICD-10-CM

## 2019-06-06 DIAGNOSIS — Z6834 Body mass index (BMI) 34.0-34.9, adult: Secondary | ICD-10-CM

## 2019-06-11 ENCOUNTER — Other Ambulatory Visit: Payer: Self-pay

## 2019-06-11 ENCOUNTER — Encounter (INDEPENDENT_AMBULATORY_CARE_PROVIDER_SITE_OTHER): Payer: Self-pay | Admitting: Family Medicine

## 2019-06-11 ENCOUNTER — Telehealth (INDEPENDENT_AMBULATORY_CARE_PROVIDER_SITE_OTHER): Payer: No Typology Code available for payment source | Admitting: Family Medicine

## 2019-06-11 DIAGNOSIS — Z6834 Body mass index (BMI) 34.0-34.9, adult: Secondary | ICD-10-CM

## 2019-06-11 DIAGNOSIS — I10 Essential (primary) hypertension: Secondary | ICD-10-CM

## 2019-06-11 DIAGNOSIS — E6609 Other obesity due to excess calories: Secondary | ICD-10-CM | POA: Diagnosis not present

## 2019-06-11 DIAGNOSIS — E7800 Pure hypercholesterolemia, unspecified: Secondary | ICD-10-CM | POA: Diagnosis not present

## 2019-06-11 MED ORDER — SAXENDA 18 MG/3ML ~~LOC~~ SOPN: 3.0000 mg | PEN_INJECTOR | Freq: Every day | SUBCUTANEOUS | 0 refills | Status: DC

## 2019-06-11 MED ORDER — AMLODIPINE BESYLATE 10 MG PO TABS: 5.0000 mg | ORAL_TABLET | Freq: Every day | ORAL | 0 refills | Status: DC

## 2019-06-11 NOTE — Progress Notes (Signed)
TeleHealth Visit:  Due to the COVID-19 pandemic, this visit was completed with telemedicine (audio/video) technology to reduce patient and provider exposure as well as to preserve personal protective equipment.   Hailey Holden has verbally consented to this TeleHealth visit. The patient is located at work, the provider is located at the [employer] [facility] office. The participants in this visit include the listed provider and patient and any and all parties involved. The visit was conducted today via FaceTime.  Chief Complaint: OBESITY Hailey Holden is here to discuss her progress with her obesity treatment plan along with follow-up of her obesity related diagnoses. Hailey Holden is on the Category 3 Plan and states she is following her eating plan approximately 50% of the time. Hailey Holden states she is exercising 0 minutes 0 times per week.  Today's visit was #: 19 Starting weight: 204 lbs Starting date: 05/15/2018  Interim History: Hailey Holden has had some dizziness and near fainting spells. Patient reports a weight of 201 pounds yesterday. She has to plan better to have available food on the plan.  Subjective:   Essential hypertension Hailey Holden's blood pressure is well controlled at home 117/76. She denies chest pain, chest pressure or headache. She is on Amlodipine and HCTZ.  BP Readings from Last 3 Encounters:  05/28/19 102/72  04/09/19 122/83  02/07/19 121/80   Lab Results  Component Value Date   CREATININE 0.64 05/28/2019   CREATININE 0.64 09/18/2018   CREATININE 0.61 05/15/2018   Elevated cholesterol Hailey Holden has HDL of 39 and LDL of 177. She is not on medications. Her age is not appropriate for ASCVD calculation.  Lab Results  Component Value Date   CHOL 230 (H) 05/28/2019   HDL 39 (L) 05/28/2019   LDLCALC 177 (H) 05/28/2019   TRIG 80 05/28/2019   CHOLHDL 5.9 (H) 02/16/2018   Lab Results  Component Value Date   ALT 22 05/28/2019   AST 17 05/28/2019   ALKPHOS 139 (H)  05/28/2019   BILITOT 0.7 05/28/2019    Assessment/Plan:   Essential hypertension Hailey Holden is working on healthy weight loss and exercise to improve blood pressure control. She agrees to decrease Amlodipine to 5 mg once daily.  Elevated cholesterol We will retest fasting lipid panel in 3 months. Hailey Holden will continue to work on diet, exercise and weight loss efforts. Orders and follow up as documented in patient record.   Class 1 obesity due to excess calories with serious comorbidity and body mass index (BMI) of 34.0 to 34.9 in adult Hailey Holden is currently in the action stage of change. As such, her goal is to continue with weight loss efforts. She has agreed to the Category 3 Plan. Hailey Holden will aim for 75% on the plan.  Exercise goals: No exercise has been prescribed at this time.  Behavioral modification strategies: increasing lean protein intake, increasing vegetables, meal planning and cooking strategies, keeping healthy foods in the home and planning for success.  We discussed various medication options to help Hailey Holden with her weight loss efforts and we both agreed to continue Liraglutide (SAXENDA) 3 mg sub Q daily #5 pens with no refills.  Hailey Holden has agreed to follow-up with our clinic in 2 weeks. She was informed of the importance of frequent follow-up visits to maximize her success with intensive lifestyle modifications for her multiple health conditions.  Objective:   VITALS: Per patient if applicable, see vitals. GENERAL: Alert and in no acute distress. CARDIOPULMONARY: No increased WOB. Speaking in clear sentences.  PSYCH:  Pleasant and cooperative. Speech normal rate and rhythm. Affect is appropriate. Insight and judgement are appropriate. Attention is focused, linear, and appropriate.  NEURO: Oriented as arrived to appointment on time with no prompting.   Lab Results  Component Value Date   CREATININE 0.64 05/28/2019   BUN 9 05/28/2019   NA 141 05/28/2019   K 3.8 05/28/2019     CL 102 05/28/2019   CO2 22 05/28/2019   Lab Results  Component Value Date   ALT 22 05/28/2019   AST 17 05/28/2019   ALKPHOS 139 (H) 05/28/2019   BILITOT 0.7 05/28/2019   Lab Results  Component Value Date   HGBA1C 5.2 05/28/2019   HGBA1C 5.5 09/18/2018   HGBA1C 5.4 05/15/2018   HGBA1C 5.2 04/24/2015   HGBA1C 5.4 10/02/2012   Lab Results  Component Value Date   INSULIN 14.3 05/28/2019   INSULIN 17.0 09/18/2018   INSULIN 13.7 05/15/2018   Lab Results  Component Value Date   TSH 1.520 05/28/2019   Lab Results  Component Value Date   CHOL 230 (H) 05/28/2019   HDL 39 (L) 05/28/2019   LDLCALC 177 (H) 05/28/2019   TRIG 80 05/28/2019   CHOLHDL 5.9 (H) 02/16/2018   Lab Results  Component Value Date   WBC 12.3 (H) 10/27/2016   HGB 13.3 10/27/2016   HCT 40.5 10/27/2016   MCV 86 10/27/2016   PLT 434 (H) 10/27/2016   No results found for: IRON, TIBC, FERRITIN   Ref. Range 09/18/2018 07:53  Vitamin D, 25-Hydroxy Latest Ref Range: 30.0 - 100.0 ng/mL 79.2   Attestation Statements:   Reviewed by clinician on day of visit: allergies, medications, problem list, medical history, surgical history, family history, social history, and previous encounter notes.  I, Doreene Nest, am acting as transcriptionist for Coralie Common, MD.  I have reviewed the above documentation for accuracy and completeness, and I agree with the above. - Ilene Qua, MD

## 2019-06-17 ENCOUNTER — Encounter (INDEPENDENT_AMBULATORY_CARE_PROVIDER_SITE_OTHER): Payer: Self-pay

## 2019-06-17 MED FILL — SAXENDA 18 MG/3 ML PEN: 18 | 30 days supply | Qty: 15 | Fill #0

## 2019-06-25 ENCOUNTER — Ambulatory Visit (INDEPENDENT_AMBULATORY_CARE_PROVIDER_SITE_OTHER): Payer: No Typology Code available for payment source | Admitting: Family Medicine

## 2019-07-15 ENCOUNTER — Encounter: Payer: Self-pay | Admitting: Nurse Practitioner

## 2019-07-15 MED FILL — DULoxetine HCL 60 MG CPEP: 60 | 90 days supply | Qty: 90 | Fill #0

## 2019-07-16 ENCOUNTER — Other Ambulatory Visit: Payer: Self-pay

## 2019-07-16 ENCOUNTER — Encounter: Payer: Self-pay | Admitting: Adult Health

## 2019-07-16 ENCOUNTER — Ambulatory Visit (INDEPENDENT_AMBULATORY_CARE_PROVIDER_SITE_OTHER): Payer: No Typology Code available for payment source | Admitting: Adult Health

## 2019-07-16 VITALS — BP 119/81 | HR 113 | Ht 64.0 in | Wt 204.0 lb

## 2019-07-16 DIAGNOSIS — N6341 Unspecified lump in right breast, subareolar: Secondary | ICD-10-CM | POA: Insufficient documentation

## 2019-07-16 NOTE — Progress Notes (Signed)
  Subjective:     Patient ID: Hailey Holden, female   DOB: 07-06-1985, 34 y.o.   MRN: [id number]  HPI Hailey Holden is a 34 year old white female,married, G2P2 in complaining of pain in right breast for about a week. PCP is [facility].   Review of Systems Has pain in right breast for about a week, feels deep and under nipple Reviewed past medical,surgical, social and family history. Reviewed medications and allergies.     Objective:   Physical Exam BP 119/81 (BP Location: Left Arm, Patient Position: Sitting, Cuff Size: Normal)   Pulse (!) 113   Ht 5\' 4"  (1.626 m)   Wt 204 lb (92.5 kg)   LMP 07/11/2019 (Exact Date)   BMI 35.02 kg/m   Skin warm and dry,  Breasts:no dominate palpable mass, retraction or nipple discharge, on the left, on the right, no retraction or nipple discharge, but has 2 cm mass under right nipple/areola are, tender    Assessment:     1. Subareolar mass of right breast Scheduled diagnostic mammogram and right and left if needed, at [facility] 4/27 at 10:40 am     Plan:     Follow up prn

## 2019-07-22 ENCOUNTER — Other Ambulatory Visit: Payer: Self-pay

## 2019-07-22 ENCOUNTER — Encounter (INDEPENDENT_AMBULATORY_CARE_PROVIDER_SITE_OTHER): Payer: Self-pay | Admitting: Family Medicine

## 2019-07-22 ENCOUNTER — Ambulatory Visit (INDEPENDENT_AMBULATORY_CARE_PROVIDER_SITE_OTHER): Payer: No Typology Code available for payment source | Admitting: Family Medicine

## 2019-07-22 VITALS — BP 114/74 | HR 100 | Temp 98.6°F | Ht 64.0 in | Wt 205.0 lb

## 2019-07-22 DIAGNOSIS — I10 Essential (primary) hypertension: Secondary | ICD-10-CM

## 2019-07-22 DIAGNOSIS — E038 Other specified hypothyroidism: Secondary | ICD-10-CM

## 2019-07-22 DIAGNOSIS — Z6835 Body mass index (BMI) 35.0-35.9, adult: Secondary | ICD-10-CM | POA: Diagnosis not present

## 2019-07-22 MED FILL — MONTELUKAST SOD 10 MG TAB: 10 | 90 days supply | Qty: 90 | Fill #0

## 2019-07-24 NOTE — Progress Notes (Signed)
Chief Complaint:   OBESITY Hailey Holden is here to discuss her progress with her obesity treatment plan along with follow-up of her obesity related diagnoses. Hailey Holden is on the Category 3 Plan and states she is following her eating plan approximately 0% of the time. Hailey Holden states she is doing 0 minutes 0 times per week.  Today's visit was #: 20 Starting weight: 204 lbs Starting date: 05/15/2018 Today's weight: 205 lbs Today's date: 07/22/2019 Total lbs lost to date: 0 Total lbs lost since last in-office visit: 0  Interim History: Hailey Holden went to [employer] and has struggled to get back on track. She did some celebrating on her birthday as well. She wants to get back on the meal plan.  Subjective:   1. Essential hypertension [name]'s blood pressure is well controlled. She denies chest pain, chest pressure, or headaches.  2. Other specified hypothyroidism Hailey Holden denies cold/heat intolerance or palpitations. She is on levothyroxine, and last TSH was within normal limits.  Assessment/Plan:   1. Essential hypertension Hailey Holden is working on healthy weight loss and exercise to improve blood pressure control. We will watch for signs of hypotension as she continues her lifestyle modifications. Hailey Holden agreed to continue amlodipine 5 mg PO daily.  2. Other specified hypothyroidism Patient with long-standing hypothyroidism, [name] agreed to continue levothyroxine. She appears euthyroid. Orders and follow up as documented in patient record.  Counseling . Good thyroid control is important for overall health. Supratherapeutic thyroid levels are dangerous and will not improve weight loss results. . The correct way to take levothyroxine is fasting, with water, separated by at least 30 minutes from breakfast, and separated by more than 4 hours from calcium, iron, multivitamins, acid reflux medications (PPIs).   3. Class 2 severe obesity with serious comorbidity and body mass index (BMI) of 35.0 to 35.9  in adult, unspecified obesity type (HCC) Hailey Holden is currently in the action stage of change. As such, her goal is to get back to weightloss efforts . She has agreed to the Category 3 Plan.   Exercise goals: No exercise has been prescribed at this time.  Behavioral modification strategies: increasing lean protein intake, increasing vegetables, meal planning and cooking strategies, keeping healthy foods in the home and planning for success.  Hailey Holden has agreed to follow-up with our clinic in 2 weeks. She was informed of the importance of frequent follow-up visits to maximize her success with intensive lifestyle modifications for her multiple health conditions.   Objective:   Blood pressure 114/74, pulse 100, temperature 98.6 F (37 C), temperature source Oral, height 5\' 4"  (1.626 m), weight 205 lb (93 kg), last menstrual period 07/11/2019, SpO2 97 %, not currently breastfeeding. Body mass index is 35.19 kg/m.  General: Cooperative, alert, well developed, in no acute distress. HEENT: Conjunctivae and lids unremarkable. Cardiovascular: Regular rhythm.  Lungs: Normal work of breathing. Neurologic: No focal deficits.   Lab Results  Component Value Date   CREATININE 0.64 05/28/2019   BUN 9 05/28/2019   NA 141 05/28/2019   K 3.8 05/28/2019   CL 102 05/28/2019   CO2 22 05/28/2019   Lab Results  Component Value Date   ALT 22 05/28/2019   AST 17 05/28/2019   ALKPHOS 139 (H) 05/28/2019   BILITOT 0.7 05/28/2019   Lab Results  Component Value Date   HGBA1C 5.2 05/28/2019   HGBA1C 5.5 09/18/2018   HGBA1C 5.4 05/15/2018   HGBA1C 5.2 04/24/2015   HGBA1C 5.4 10/02/2012   Lab Results  Component Value Date   INSULIN 14.3 05/28/2019   INSULIN 17.0 09/18/2018   INSULIN 13.7 05/15/2018   Lab Results  Component Value Date   TSH 1.520 05/28/2019   Lab Results  Component Value Date   CHOL 230 (H) 05/28/2019   HDL 39 (L) 05/28/2019   LDLCALC 177 (H) 05/28/2019   TRIG 80 05/28/2019    CHOLHDL 5.9 (H) 02/16/2018   Lab Results  Component Value Date   WBC 12.3 (H) 10/27/2016   HGB 13.3 10/27/2016   HCT 40.5 10/27/2016   MCV 86 10/27/2016   PLT 434 (H) 10/27/2016   No results found for: IRON, TIBC, FERRITIN  Attestation Statements:   Reviewed by clinician on day of visit: allergies, medications, problem list, medical history, surgical history, family history, social history, and previous encounter notes.  Time spent on visit including pre-visit chart review and post-visit care and charting was 15 minutes.    I, Burt Knack, am acting as transcriptionist for Margarette Asal, MD.  I have reviewed the above documentation for accuracy and completeness, and I agree with the above. - Debbra Riding, MD

## 2019-07-30 ENCOUNTER — Ambulatory Visit (HOSPITAL_COMMUNITY)
Admission: RE | Admit: 2019-07-30 | Discharge: 2019-07-30 | Disposition: A | Discharge status: Home / Self Care | Payer: No Typology Code available for payment source | Source: Ambulatory Visit | Attending: Adult Health | Admitting: Adult Health

## 2019-07-30 ENCOUNTER — Ambulatory Visit (INDEPENDENT_AMBULATORY_CARE_PROVIDER_SITE_OTHER): Payer: No Typology Code available for payment source | Admitting: Family Medicine

## 2019-07-30 ENCOUNTER — Other Ambulatory Visit: Payer: Self-pay

## 2019-07-30 ENCOUNTER — Ambulatory Visit (HOSPITAL_COMMUNITY): Payer: No Typology Code available for payment source

## 2019-07-30 DIAGNOSIS — N6341 Unspecified lump in right breast, subareolar: Secondary | ICD-10-CM | POA: Insufficient documentation

## 2019-07-30 MED FILL — ALBUTEROL SULFATE HFA 108 (: 108 (90 BAS | 17 days supply | Qty: 9 | Fill #3

## 2019-08-07 ENCOUNTER — Encounter (INDEPENDENT_AMBULATORY_CARE_PROVIDER_SITE_OTHER): Payer: Self-pay | Admitting: Physician Assistant

## 2019-08-07 ENCOUNTER — Other Ambulatory Visit: Payer: Self-pay

## 2019-08-07 ENCOUNTER — Ambulatory Visit (INDEPENDENT_AMBULATORY_CARE_PROVIDER_SITE_OTHER): Payer: No Typology Code available for payment source | Admitting: Physician Assistant

## 2019-08-07 VITALS — BP 143/78 | HR 99 | Temp 98.1°F | Ht 64.0 in | Wt 204.0 lb

## 2019-08-07 DIAGNOSIS — Z9189 Other specified personal risk factors, not elsewhere classified: Secondary | ICD-10-CM

## 2019-08-07 DIAGNOSIS — I10 Essential (primary) hypertension: Secondary | ICD-10-CM | POA: Diagnosis not present

## 2019-08-07 DIAGNOSIS — Z6835 Body mass index (BMI) 35.0-35.9, adult: Secondary | ICD-10-CM | POA: Diagnosis not present

## 2019-08-07 MED ORDER — AMLODIPINE BESYLATE 5 MG PO TABS: 5.0000 mg | ORAL_TABLET | Freq: Every day | ORAL | 0 refills | Status: DC

## 2019-08-07 MED FILL — AMLODIPINE BESYLATE 5 MG TA: 5 | 90 days supply | Qty: 90 | Fill #0

## 2019-08-08 NOTE — Progress Notes (Signed)
Chief Complaint:   OBESITY Hailey Holden is here to discuss her progress with her obesity treatment plan along with follow-up of her obesity related diagnoses. Hailey Holden is on the Category 3 Plan and states she is following her eating plan approximately 75% of the time. Hailey Holden states she is doing 0 minutes 0 times per week.  Today's visit was #: 21 Starting weight: 204 lbs Starting date: 05/15/2018 Today's weight: 204 lbs Today's date: 08/07/2019 Total lbs lost to date: 0 Total lbs lost since last in-office visit: 1  Interim History: Hailey Holden states that she finds herself snacking a lot in the afternoon, and she is wondering if her Hailey Holden needs to be increased.  Subjective:   1. Essential hypertension Hailey Holden denies dizziness, chest pain, or headaches on Norvasc.  2. At risk for heart disease Hailey Holden is at a higher than average risk for cardiovascular disease due to obesity.   Assessment/Plan:   1. Essential hypertension Hailey Holden is working on healthy weight loss and exercise to improve blood pressure control. We will watch for signs of hypotension as she continues her lifestyle modifications. We will refill Norvasc for 90 days with no refills.  - amLODipine (NORVASC) 5 MG tablet; Take 1 tablet (5 mg total) by mouth daily.  Dispense: 90 tablet; Refill: 0  2. At risk for heart disease Hailey Holden was given approximately 15 minutes of coronary artery disease prevention counseling today. She is 34 y.o. female and has risk factors for heart disease including obesity. We discussed intensive lifestyle modifications today with an emphasis on specific weight loss instructions and strategies.   Repetitive spaced learning was employed today to elicit superior memory formation and behavioral change.  3. Class 2 severe obesity with serious comorbidity and body mass index (BMI) of 35.0 to 35.9 in adult, unspecified obesity type (HCC) Hailey Holden is currently in the action stage of change. As such, her goal is to  continue with weight loss efforts. She has agreed to the Category 3 Plan.   We discussed various medication options to help Hailey Holden with her weight loss efforts and we both agreed to increase Saxenda to 1.8 mg daily.  Exercise goals: No exercise has been prescribed at this time.  Behavioral modification strategies: increasing lean protein intake and meal planning and cooking strategies.  Hailey Holden has agreed to follow-up with our clinic in 2 to 3 weeks. She was informed of the importance of frequent follow-up visits to maximize her success with intensive lifestyle modifications for her multiple health conditions.   Objective:   Blood pressure (!) 143/78, pulse 99, temperature 98.1 F (36.7 C), temperature source Oral, height 5\' 4"  (1.626 m), weight 204 lb (92.5 kg), last menstrual period 07/11/2019, SpO2 97 %, not currently breastfeeding. Body mass index is 35.02 kg/m.  General: Cooperative, alert, well developed, in no acute distress. HEENT: Conjunctivae and lids unremarkable. Cardiovascular: Regular rhythm.  Lungs: Normal work of breathing. Neurologic: No focal deficits.   Lab Results  Component Value Date   CREATININE 0.64 05/28/2019   BUN 9 05/28/2019   NA 141 05/28/2019   K 3.8 05/28/2019   CL 102 05/28/2019   CO2 22 05/28/2019   Lab Results  Component Value Date   ALT 22 05/28/2019   AST 17 05/28/2019   ALKPHOS 139 (H) 05/28/2019   BILITOT 0.7 05/28/2019   Lab Results  Component Value Date   HGBA1C 5.2 05/28/2019   HGBA1C 5.5 09/18/2018   HGBA1C 5.4 05/15/2018   HGBA1C 5.2 04/24/2015  HGBA1C 5.4 10/02/2012   Lab Results  Component Value Date   INSULIN 14.3 05/28/2019   INSULIN 17.0 09/18/2018   INSULIN 13.7 05/15/2018   Lab Results  Component Value Date   TSH 1.520 05/28/2019   Lab Results  Component Value Date   CHOL 230 (H) 05/28/2019   HDL 39 (L) 05/28/2019   LDLCALC 177 (H) 05/28/2019   TRIG 80 05/28/2019   CHOLHDL 5.9 (H) 02/16/2018   Lab  Results  Component Value Date   WBC 12.3 (H) 10/27/2016   HGB 13.3 10/27/2016   HCT 40.5 10/27/2016   MCV 86 10/27/2016   PLT 434 (H) 10/27/2016   No results found for: IRON, TIBC, FERRITIN  Attestation Statements:   Reviewed by clinician on day of visit: allergies, medications, problem list, medical history, surgical history, family history, social history, and previous encounter notes.   Trude Mcburney, am acting as transcriptionist for [employer], PA-C.  I have reviewed the above documentation for accuracy and completeness, and I agree with the above. Alois Cliche, PA-C

## 2019-08-28 ENCOUNTER — Ambulatory Visit (INDEPENDENT_AMBULATORY_CARE_PROVIDER_SITE_OTHER): Payer: No Typology Code available for payment source | Admitting: Physician Assistant

## 2019-08-28 ENCOUNTER — Encounter (INDEPENDENT_AMBULATORY_CARE_PROVIDER_SITE_OTHER): Payer: Self-pay | Admitting: Physician Assistant

## 2019-08-28 ENCOUNTER — Other Ambulatory Visit: Payer: Self-pay

## 2019-08-28 VITALS — BP 107/73 | HR 104 | Temp 98.0°F | Ht 64.0 in | Wt 202.0 lb

## 2019-08-28 DIAGNOSIS — Z9189 Other specified personal risk factors, not elsewhere classified: Secondary | ICD-10-CM | POA: Diagnosis not present

## 2019-08-28 DIAGNOSIS — Z6834 Body mass index (BMI) 34.0-34.9, adult: Secondary | ICD-10-CM | POA: Diagnosis not present

## 2019-08-28 DIAGNOSIS — E6609 Other obesity due to excess calories: Secondary | ICD-10-CM

## 2019-08-28 DIAGNOSIS — I10 Essential (primary) hypertension: Secondary | ICD-10-CM | POA: Diagnosis not present

## 2019-08-28 MED ORDER — SAXENDA 18 MG/3ML ~~LOC~~ SOPN: 3.0000 mg | PEN_INJECTOR | Freq: Every day | SUBCUTANEOUS | 0 refills | Status: DC

## 2019-08-28 MED FILL — SAXENDA 18 MG/3 ML PEN: 18 | 30 days supply | Qty: 15 | Fill #0

## 2019-08-29 ENCOUNTER — Encounter (INDEPENDENT_AMBULATORY_CARE_PROVIDER_SITE_OTHER): Payer: Self-pay | Admitting: Physician Assistant

## 2019-08-29 NOTE — Telephone Encounter (Signed)
Please advise 

## 2019-08-29 NOTE — Progress Notes (Signed)
Chief Complaint:   OBESITY Hailey Holden is here to discuss her progress with her obesity treatment plan along with follow-up of her obesity related diagnoses. Hailey Holden is on the Category 3 Plan and states she is following her eating plan approximately 75% of the time. Hailey Holden states she is walking 30-45 minutes 3 times per week.  Today's visit was #: 22 Starting weight: 204 lbs Starting date: 05/15/2018 Today's weight: 202 lbs Today's date: 08/28/2019 Total lbs lost to date: 2 Total lbs lost since last in-office visit: 2  Interim History: Hailey Holden states that she has been focusing on getting more protein in. Wiith the increase in Saxenda, she states her hunger is much better controlled.  Subjective:   Essential hypertension. Hailey Holden is on Norvasc. No chest pain or headache. She stopped HCTZ on her own when she ran out, as she is not getting anymore swelling in her feet.  BP Readings from Last 3 Encounters:  08/28/19 107/73  08/07/19 (!) 143/78  07/22/19 114/74   Lab Results  Component Value Date   CREATININE 0.64 05/28/2019   CREATININE 0.64 09/18/2018   CREATININE 0.61 05/15/2018   At risk for heart disease. Hailey Holden is at a higher than average risk for cardiovascular disease due to obesity.   Assessment/Plan:   Essential hypertension. Hailey Holden is working on healthy weight loss and exercise to improve blood pressure control. We will watch for signs of hypotension as she continues her lifestyle modifications. She will continue her medication as directed.   At risk for heart disease. Hailey Holden was given approximately 15 minutes of coronary artery disease prevention counseling today. She is 34 y.o. female and has risk factors for heart disease including obesity. We discussed intensive lifestyle modifications today with an emphasis on specific weight loss instructions and strategies.   Repetitive spaced learning was employed today to elicit superior memory formation and behavioral  change.  Class 1 obesity due to excess calories with serious comorbidity and body mass index (BMI) of 34.0 to 34.9 in adult. Refill was given for Liraglutide - Weight Management (SAXENDA) 18 MG/3ML SOPN #5 with 0 refills.  Hailey Holden is currently in the action stage of change. As such, her goal is to continue with weight loss efforts. She has agreed to the Category 3 Plan.   Exercise goals: For substantial health benefits, adults should do at least 150 minutes (2 hours and 30 minutes) a week of moderate-intensity, or 75 minutes (1 hour and 15 minutes) a week of vigorous-intensity aerobic physical activity, or an equivalent combination of moderate- and vigorous-intensity aerobic activity. Aerobic activity should be performed in episodes of at least 10 minutes, and preferably, it should be spread throughout the week.  Behavioral modification strategies: meal planning and cooking strategies and keeping healthy foods in the home.  Hailey Holden has agreed to follow-up with our clinic in 3 weeks. She was informed of the importance of frequent follow-up visits to maximize her success with intensive lifestyle modifications for her multiple health conditions.   Objective:   Blood pressure 107/73, pulse (!) 104, temperature 98 F (36.7 C), temperature source Oral, height 5\' 4"  (1.626 m), weight 202 lb (91.6 kg), SpO2 96 %, not currently breastfeeding. Body mass index is 34.67 kg/m.  General: Cooperative, alert, well developed, in no acute distress. HEENT: Conjunctivae and lids unremarkable. Cardiovascular: Regular rhythm.  Lungs: Normal work of breathing. Neurologic: No focal deficits.   Lab Results  Component Value Date   CREATININE 0.64 05/28/2019   BUN  9 05/28/2019   NA 141 05/28/2019   K 3.8 05/28/2019   CL 102 05/28/2019   CO2 22 05/28/2019   Lab Results  Component Value Date   ALT 22 05/28/2019   AST 17 05/28/2019   ALKPHOS 139 (H) 05/28/2019   BILITOT 0.7 05/28/2019   Lab Results    Component Value Date   HGBA1C 5.2 05/28/2019   HGBA1C 5.5 09/18/2018   HGBA1C 5.4 05/15/2018   HGBA1C 5.2 04/24/2015   HGBA1C 5.4 10/02/2012   Lab Results  Component Value Date   INSULIN 14.3 05/28/2019   INSULIN 17.0 09/18/2018   INSULIN 13.7 05/15/2018   Lab Results  Component Value Date   TSH 1.520 05/28/2019   Lab Results  Component Value Date   CHOL 230 (H) 05/28/2019   HDL 39 (L) 05/28/2019   LDLCALC 177 (H) 05/28/2019   TRIG 80 05/28/2019   CHOLHDL 5.9 (H) 02/16/2018   Lab Results  Component Value Date   WBC 12.3 (H) 10/27/2016   HGB 13.3 10/27/2016   HCT 40.5 10/27/2016   MCV 86 10/27/2016   PLT 434 (H) 10/27/2016   No results found for: IRON, TIBC, FERRITIN  Attestation Statements:   Reviewed by clinician on day of visit: allergies, medications, problem list, medical history, surgical history, family history, social history, and previous encounter notes.  IMichaelene Song, am acting as transcriptionist for Abby Potash, PA-C   I have reviewed the above documentation for accuracy and completeness, and I agree with the above. Abby Potash, PA-C

## 2019-09-19 ENCOUNTER — Encounter (INDEPENDENT_AMBULATORY_CARE_PROVIDER_SITE_OTHER): Payer: Self-pay | Admitting: Adult Health

## 2019-09-19 ENCOUNTER — Ambulatory Visit (INDEPENDENT_AMBULATORY_CARE_PROVIDER_SITE_OTHER): Payer: No Typology Code available for payment source | Admitting: Adult Health

## 2019-09-19 ENCOUNTER — Other Ambulatory Visit: Payer: Self-pay

## 2019-09-19 VITALS — BP 106/72 | HR 82 | Temp 98.1°F | Ht 64.0 in | Wt 199.0 lb

## 2019-09-19 DIAGNOSIS — I10 Essential (primary) hypertension: Secondary | ICD-10-CM

## 2019-09-19 DIAGNOSIS — E6609 Other obesity due to excess calories: Secondary | ICD-10-CM

## 2019-09-19 DIAGNOSIS — Z6834 Body mass index (BMI) 34.0-34.9, adult: Secondary | ICD-10-CM

## 2019-09-19 DIAGNOSIS — E559 Vitamin D deficiency, unspecified: Secondary | ICD-10-CM

## 2019-09-19 DIAGNOSIS — Z9189 Other specified personal risk factors, not elsewhere classified: Secondary | ICD-10-CM

## 2019-09-20 LAB — LIPID PANEL
Chol/HDL Ratio: 5.5 ratio — ABNORMAL HIGH (ref 0.0–4.4)
Cholesterol, Total: 203 mg/dL — ABNORMAL HIGH (ref 100–199)
HDL: 37 mg/dL — ABNORMAL LOW (ref >39)
LDL Chol Calc (NIH): 144 mg/dL — ABNORMAL HIGH (ref 0–99)
Triglycerides: 120 mg/dL (ref 0–149)
VLDL Cholesterol Cal: 22 mg/dL (ref 5–40)

## 2019-09-20 LAB — CBC WITH DIFFERENTIAL/PLATELET
Basophils Absolute: 0.1 10*3/uL (ref 0.0–0.2)
Basos: 1 %
EOS (ABSOLUTE): 0.3 10*3/uL (ref 0.0–0.4)
Eos: 5 %
Hematocrit: 41.4 % (ref 34.0–46.6)
Hemoglobin: 13.6 g/dL (ref 11.1–15.9)
Immature Grans (Abs): 0 10*3/uL (ref 0.0–0.1)
Immature Granulocytes: 0 %
Lymphocytes Absolute: 2.3 10*3/uL (ref 0.7–3.1)
Lymphs: 35 %
MCH: 27.8 pg (ref 26.6–33.0)
MCHC: 32.9 g/dL (ref 31.5–35.7)
MCV: 85 fL (ref 79–97)
Monocytes Absolute: 0.7 10*3/uL (ref 0.1–0.9)
Monocytes: 11 %
Neutrophils Absolute: 3.2 10*3/uL (ref 1.4–7.0)
Neutrophils: 48 %
Platelets: 346 10*3/uL (ref 150–450)
RBC: 4.9 x10E6/uL (ref 3.77–5.28)
RDW: 13.6 % (ref 11.7–15.4)
WBC: 6.6 10*3/uL (ref 3.4–10.8)

## 2019-09-20 LAB — INSULIN, RANDOM: INSULIN: 25.2 u[IU]/mL — ABNORMAL HIGH (ref 2.6–24.9)

## 2019-09-20 LAB — COMPREHENSIVE METABOLIC PANEL
ALT: 40 IU/L — ABNORMAL HIGH (ref 0–32)
AST: 32 IU/L (ref 0–40)
Albumin/Globulin Ratio: 1.4 (ref 1.2–2.2)
Albumin: 4.3 g/dL (ref 3.8–4.8)
Alkaline Phosphatase: 122 IU/L — ABNORMAL HIGH (ref 48–121)
BUN/Creatinine Ratio: 16 (ref 9–23)
BUN: 10 mg/dL (ref 6–20)
Bilirubin Total: 0.5 mg/dL (ref 0.0–1.2)
CO2: 21 mmol/L (ref 20–29)
Calcium: 9.1 mg/dL (ref 8.7–10.2)
Chloride: 104 mmol/L (ref 96–106)
Creatinine, Ser: 0.63 mg/dL (ref 0.57–1.00)
GFR calc Af Amer: 134 mL/min/{1.73_m2} (ref >59)
GFR calc non Af Amer: 117 mL/min/{1.73_m2} (ref >59)
Globulin, Total: 3.1 g/dL (ref 1.5–4.5)
Glucose: 95 mg/dL (ref 65–99)
Potassium: 4.5 mmol/L (ref 3.5–5.2)
Sodium: 138 mmol/L (ref 134–144)
Total Protein: 7.4 g/dL (ref 6.0–8.5)

## 2019-09-20 LAB — T4, FREE: Free T4: 1.4 ng/dL (ref 0.82–1.77)

## 2019-09-20 LAB — HEMOGLOBIN A1C
Est. average glucose Bld gHb Est-mCnc: 103 mg/dL
Hgb A1c MFr Bld: 5.2 % (ref 4.8–5.6)

## 2019-09-20 LAB — T3, FREE: T3, Free: 3.3 pg/mL (ref 2.0–4.4)

## 2019-09-20 LAB — TSH: TSH: 1.5 u[IU]/mL (ref 0.450–4.500)

## 2019-09-20 LAB — VITAMIN B12: Vitamin B-12: 1001 pg/mL (ref 232–1245)

## 2019-09-20 LAB — VITAMIN D 25 HYDROXY (VIT D DEFICIENCY, FRACTURES): Vit D, 25-Hydroxy: 64.5 ng/mL (ref 30.0–100.0)

## 2019-09-23 MED ORDER — BD PEN NEEDLE NANO 2ND GEN 32G X 4 MM MISC: 1.0000 | Freq: Two times a day (BID) | 0 refills | Status: DC

## 2019-09-23 MED FILL — UNIFINE PENTIPS 32GX5/32: 32G X 4 MM | 50 days supply | Qty: 100 | Fill #0

## 2019-09-23 NOTE — Progress Notes (Signed)
Chief Complaint:   OBESITY Hailey Holden is here to discuss her progress with her obesity treatment plan along with follow-up of her obesity related diagnoses. [name] is on the Category 3 Plan and states she is following her eating plan approximately 75% of the time. [name] states she is doing 0 minutes 0 times per week.  Today's visit was #: 23 Starting weight: 204 lbs Starting date: 05/15/2018 Today's weight: 199 lbs Today's date: 09/19/2019 Total lbs lost to date: 5 Total lbs lost since last in-office visit: 3  Interim History: [name] really likes the simple meal planning of Category 3 and denies polyphagia. She will occasionally struggle with consuming all of her dinner protein at times.  Subjective:   1. Essential hypertension [name]'s blood pressure is well controlled at her office visit today. She is on amlodipine 5 mg q daily, and she is tolerating it well. She denies lower extremity edema.  BP Readings from Last 3 Encounters:  09/19/19 106/72  08/28/19 107/73  08/07/19 (!) 143/78   2. Vitamin D deficiency [name]'s Vitamin D level was 79.2 on 09/18/2018. She is currently taking OTC vitamin D 5,000 IU each day. She denies nausea, vomiting or muscle weakness.  3. At increased risk of exposure to COVID-19 virus The patient is at higher risk of COVID-19 infection due to not having received the COVID-19 vaccine and she works in a healthcare setting. Raynie has not received COVID-19 vaccination due to previous adverse reaction to Tdap immunization.   Assessment/Plan:   1. Essential hypertension Racquel will continue her CCB regimen, and will continue working on healthy weight loss and exercise to improve blood pressure control. We will watch for signs of hypotension as she continues her lifestyle modifications. We will check labs today.  - Comprehensive metabolic panel - CBC with Differential/Platelet - Hemoglobin A1c - Insulin, random - Lipid panel - TSH - T3, free - T4,  free  2. Vitamin D deficiency Low Vitamin D level contributes to fatigue and are associated with obesity, breast, and colon cancer. [name] agreed to continue taking OTC Vit D and we will check labs today. She will follow-up for routine testing of Vitamin D, at least 2-3 times per year to avoid over-replacement.   - VITAMIN D 25 Hydroxy (Vit-D Deficiency, Fractures) - Vitamin B12  3. At increased risk of exposure to COVID-19 virus [name] was given approximately 15 minutes of COVID prevention counseling today. We discussed risks and benefits of COVID-19 vaccine. She was encouraged to further discuss with her primary care physician.  Counseling  COVID-19 is a respiratory infection that is caused by a virus. It can cause serious infections, such as pneumonia, acute respiratory distress syndrome, acute respiratory failure, or sepsis.  You are more likely to develop a serious illness if you are 35 years of age or older, have a weak immune system, live in a nursing home, have chronic disease, or have obesity.  Get vaccinated as soon as they are available to you.  For our most current information, please visit [email or web address].  Wash your hands often with soap and water for 20 seconds. If soap and water are not available, use alcohol-based hand sanitizer.  Wear a face mask. Make sure your mask covers your nose and mouth.  Maintain at least 6 feet distance from others when in public.  Get help right away if  You have trouble breathing, chest pain, confusion, or other concerning symptoms.  Repetitive spaced learning was employed today to elicit superior memory  formation and behavioral change.  4. Class 1 obesity due to excess calories with serious comorbidity and body mass index (BMI) of 34.0 to 34.9 in adult [name] is currently in the action stage of change. As such, her goal is to continue with weight loss efforts. She has agreed to the Category 3 Plan.   [name] was encouraged  to utilize protein equivalents sheet. Handout was provided. High protein low calorie food sheet provided. We will refill insulin pen needles 32 gauge x 4 mm, package use BID with no refills.  Exercise goals: No exercise has been prescribed at this time.  Behavioral modification strategies: increasing lean protein intake and meal planning and cooking strategies.  [name] has agreed to follow-up with our clinic in 2 to 3 weeks. She was informed of the importance of frequent follow-up visits to maximize her success with intensive lifestyle modifications for her multiple health conditions.   [name] was informed we would discuss her lab results at her next visit unless there is a critical issue that needs to be addressed sooner. [name] agreed to keep her next visit at the agreed upon time to discuss these results.  Objective:   Blood pressure 106/72, pulse 82, temperature 98.1 F (36.7 C), temperature source Oral, height 5\' 4"  (1.626 m), weight 199 lb (90.3 kg), last menstrual period 09/13/2019, SpO2 97 %. Body mass index is 34.16 kg/m.  General: Cooperative, alert, well developed, in no acute distress. HEENT: Conjunctivae and lids unremarkable. Cardiovascular: Regular rhythm.  Lungs: Normal work of breathing. Neurologic: No focal deficits.   Lab Results  Component Value Date   CREATININE 0.63 09/19/2019   BUN 10 09/19/2019   NA 138 09/19/2019   K 4.5 09/19/2019   CL 104 09/19/2019   CO2 21 09/19/2019   Lab Results  Component Value Date   ALT 40 (H) 09/19/2019   AST 32 09/19/2019   ALKPHOS 122 (H) 09/19/2019   BILITOT 0.5 09/19/2019   Lab Results  Component Value Date   HGBA1C 5.2 09/19/2019   HGBA1C 5.2 05/28/2019   HGBA1C 5.5 09/18/2018   HGBA1C 5.4 05/15/2018   HGBA1C 5.2 04/24/2015   Lab Results  Component Value Date   INSULIN 25.2 (H) 09/19/2019   INSULIN 14.3 05/28/2019   INSULIN 17.0 09/18/2018   INSULIN 13.7 05/15/2018   Lab Results  Component Value Date    TSH 1.500 09/19/2019   Lab Results  Component Value Date   CHOL 203 (H) 09/19/2019   HDL 37 (L) 09/19/2019   LDLCALC 144 (H) 09/19/2019   TRIG 120 09/19/2019   CHOLHDL 5.5 (H) 09/19/2019   Lab Results  Component Value Date   WBC 6.6 09/19/2019   HGB 13.6 09/19/2019   HCT 41.4 09/19/2019   MCV 85 09/19/2019   PLT 346 09/19/2019   No results found for: IRON, TIBC, FERRITIN  Attestation Statements:   Reviewed by clinician on day of visit: allergies, medications, problem list, medical history, surgical history, family history, social history, and previous encounter notes.   09/21/2019, am acting as Trude Mcburney for [employer], NP-C.  I have reviewed the above documentation for accuracy and completeness, and I agree with the above. -  [employer], NP

## 2019-09-27 MED FILL — MELOXICAM 15 MG TABLET: 15 | 30 days supply | Qty: 30 | Fill #0

## 2019-10-10 MED FILL — DULoxetine HCL 60 MG CPEP: 60 | 90 days supply | Qty: 90 | Fill #1

## 2019-10-14 ENCOUNTER — Encounter (INDEPENDENT_AMBULATORY_CARE_PROVIDER_SITE_OTHER): Payer: Self-pay | Admitting: Adult Health

## 2019-10-14 ENCOUNTER — Other Ambulatory Visit: Payer: Self-pay

## 2019-10-14 ENCOUNTER — Ambulatory Visit (INDEPENDENT_AMBULATORY_CARE_PROVIDER_SITE_OTHER): Payer: No Typology Code available for payment source | Admitting: Adult Health

## 2019-10-14 VITALS — BP 120/80 | HR 88 | Temp 97.6°F | Ht 64.0 in | Wt 202.0 lb

## 2019-10-14 DIAGNOSIS — Z6834 Body mass index (BMI) 34.0-34.9, adult: Secondary | ICD-10-CM

## 2019-10-14 DIAGNOSIS — E88819 Insulin resistance, unspecified: Secondary | ICD-10-CM

## 2019-10-14 DIAGNOSIS — E7849 Other hyperlipidemia: Secondary | ICD-10-CM

## 2019-10-14 DIAGNOSIS — E559 Vitamin D deficiency, unspecified: Secondary | ICD-10-CM | POA: Diagnosis not present

## 2019-10-14 DIAGNOSIS — I10 Essential (primary) hypertension: Secondary | ICD-10-CM | POA: Diagnosis not present

## 2019-10-14 DIAGNOSIS — E6609 Other obesity due to excess calories: Secondary | ICD-10-CM

## 2019-10-14 DIAGNOSIS — E8881 Metabolic syndrome: Secondary | ICD-10-CM

## 2019-10-14 DIAGNOSIS — E66811 Obesity, class 1: Secondary | ICD-10-CM

## 2019-10-14 NOTE — Progress Notes (Addendum)
Chief Complaint:   OBESITY Hailey Holden is here to discuss her progress with her obesity treatment plan along with follow-up of her obesity related diagnoses. Hailey Holden is on the Category 3 Plan and states she is following her eating plan approximately 50% of the time. Hailey Holden states she is exercising 0 minutes 0 times per week.  Today's visit was #: 24 Starting weight: 204 lbs Starting date: 05/15/2018 Today's weight: 202 lbs Today's date: 10/14/2019 Total lbs lost to date: 2 Total lbs lost since last in-office visit: 0  Interim History: Hailey Holden states the last week she celebrated Independence Day and was on her menstrual cycle last week. Due to those two circumstances, she deviated from the plan the last 7 days. She is ready to refocus and resume consistent meal prep/planninng.  Subjective:   Essential hypertension. Blood pressure is well controlled at her office visit today. She is on amlodipine 5 mg daily. She denies lower extremity edema. CMP stable. Reviewed recent labs with patient today.  BP Readings from Last 3 Encounters:  10/14/19 120/80  09/19/19 106/72  08/28/19 107/73   Lab Results  Component Value Date   CREATININE 0.63 09/19/2019   CREATININE 0.64 05/28/2019   CREATININE 0.64 09/18/2018   Insulin resistance. Hailey Holden has a diagnosis of insulin resistance based on her elevated fasting insulin level >5. She continues to work on diet and exercise to decrease her risk of diabetes. Insulin is slightly elevated from the last check, however, blood glucose and A1c are both at goal. Reviewed recent labs with patient today.  Lab Results  Component Value Date   INSULIN 25.2 (H) 09/19/2019   INSULIN 14.3 05/28/2019   INSULIN 17.0 09/18/2018   INSULIN 13.7 05/15/2018   Lab Results  Component Value Date   HGBA1C 5.2 09/19/2019   Other hyperlipidemia. Hailey Holden has hyperlipidemia and has been trying to improve her cholesterol levels with intensive lifestyle modification  including a low saturated fat diet, exercise and weight loss. She denies any chest pain, claudication or myalgias. Total and LDL are both improved from last check. Hailey Holden is not on statin therapy. She denies tobacco/vape use and denies first degree family history of MI/CAD/CVA.  Cannot risk stratify due to age. Reviewed recent labs with patient today.  Lab Results  Component Value Date   ALT 40 (H) 09/19/2019   AST 32 09/19/2019   ALKPHOS 122 (H) 09/19/2019   BILITOT 0.5 09/19/2019   Lab Results  Component Value Date   CHOL 203 (H) 09/19/2019   HDL 37 (L) 09/19/2019   LDLCALC 144 (H) 09/19/2019   TRIG 120 09/19/2019   CHOLHDL 5.5 (H) 09/19/2019   Vitamin D deficiency. Hailey Holden is on OTC Vitamin D3 5,000 IU daily.   Ref. Range 09/19/2019 09:56  Vitamin D, 25-Hydroxy Latest Ref Range: 30.0 - 100.0 ng/mL 64.5   Assessment/Plan:   Essential hypertension. [name] is working on healthy weight loss and exercise to improve blood pressure control. We will watch for signs of hypotension as she continues her lifestyle modifications. She will continue her calcium channel blocker as directed and will continue the  Category 3 meal plan.  Insulin resistance. Hailey Holden will continue to work on weight loss, exercise, and decreasing simple carbohydrates to help decrease the risk of diabetes. Hailey Holden agreed to follow-up with [name] as directed to closely monitor her progress. She will continue the GLP-1 as directed and the Category 3 meal plan. Labs will be checked every 3 months.  Other hyperlipidemia. Cardiovascular  risk and specific lipid/LDL goals reviewed.  We discussed several lifestyle modifications today and Hailey Holden will continue to work on diet, exercise and weight loss efforts. Orders and follow up as documented in patient record. She will continue following the Category 3 meal plan. Will monitor labs.  Counseling Intensive lifestyle modifications are the first line treatment for this issue. . Dietary  changes: Increase soluble fiber. Decrease simple carbohydrates. . Exercise changes: Moderate to vigorous-intensity aerobic activity 150 minutes per week if tolerated.  . Lipid-lowering medications: see documented in medical record.  Vitamin D deficiency. Low Vitamin D level contributes to fatigue and are associated with obesity, breast, and colon cancer. She agrees to continue to take OTC Vitamin D as directed and will follow-up for routine testing of Vitamin D every 3 months.  Class 1 obesity due to excess calories with serious comorbidity and body mass index (BMI) of 34.0 to 34.9 in adult. Hailey Holden will continue Saxenda 1.8 QD (no need for refill today).  Hailey Holden is currently in the action stage of change. As such, her goal is to continue with weight loss efforts. She has agreed to the Category 3 Plan.   Exercise goals: No exercise has been prescribed at this time.  Behavioral modification strategies: increasing lean protein intake, meal planning and cooking strategies and planning for success.  Hailey Holden has agreed to follow-up with our clinic in 2-3 weeks. She was informed of the importance of frequent follow-up visits to maximize her success with intensive lifestyle modifications for her multiple health conditions.   Objective:   Blood pressure 120/80, pulse 88, temperature 97.6 F (36.4 C), temperature source Oral, height 5\' 4"  (1.626 m), weight 202 lb (91.6 kg), SpO2 99 %. Body mass index is 34.67 kg/m.  General: Cooperative, alert, well developed, in no acute distress. HEENT: Conjunctivae and lids unremarkable. Cardiovascular: Regular rhythm.  Lungs: Normal work of breathing. Neurologic: No focal deficits.   Lab Results  Component Value Date   CREATININE 0.63 09/19/2019   BUN 10 09/19/2019   NA 138 09/19/2019   K 4.5 09/19/2019   CL 104 09/19/2019   CO2 21 09/19/2019   Lab Results  Component Value Date   ALT 40 (H) 09/19/2019   AST 32 09/19/2019   ALKPHOS 122 (H)  09/19/2019   BILITOT 0.5 09/19/2019   Lab Results  Component Value Date   HGBA1C 5.2 09/19/2019   HGBA1C 5.2 05/28/2019   HGBA1C 5.5 09/18/2018   HGBA1C 5.4 05/15/2018   HGBA1C 5.2 04/24/2015   Lab Results  Component Value Date   INSULIN 25.2 (H) 09/19/2019   INSULIN 14.3 05/28/2019   INSULIN 17.0 09/18/2018   INSULIN 13.7 05/15/2018   Lab Results  Component Value Date   TSH 1.500 09/19/2019   Lab Results  Component Value Date   CHOL 203 (H) 09/19/2019   HDL 37 (L) 09/19/2019   LDLCALC 144 (H) 09/19/2019   TRIG 120 09/19/2019   CHOLHDL 5.5 (H) 09/19/2019   Lab Results  Component Value Date   WBC 6.6 09/19/2019   HGB 13.6 09/19/2019   HCT 41.4 09/19/2019   MCV 85 09/19/2019   PLT 346 09/19/2019   No results found for: IRON, TIBC, FERRITIN  Attestation Statements:   Reviewed by clinician on day of visit: allergies, medications, problem list, medical history, surgical history, family history, social history, and previous encounter notes.  Time spent on visit including pre-visit chart review and post-visit charting and care was 27 minutes.   I, 09/21/2019,  am acting as transcriptionist for [employer], NP-C   I have reviewed the above documentation for accuracy and completeness, and I agree with the above. -  Julaine Fusi, NP

## 2019-10-22 MED FILL — DICLOFENAC SOD EC 75 MG TAB: 75 | 30 days supply | Qty: 60 | Fill #0

## 2019-10-22 MED FILL — MONTELUKAST SOD 10 MG TAB: 10 | 90 days supply | Qty: 90 | Fill #1

## 2019-11-06 ENCOUNTER — Ambulatory Visit (INDEPENDENT_AMBULATORY_CARE_PROVIDER_SITE_OTHER): Payer: No Typology Code available for payment source | Admitting: Adult Health

## 2019-11-20 ENCOUNTER — Other Ambulatory Visit (INDEPENDENT_AMBULATORY_CARE_PROVIDER_SITE_OTHER): Payer: Self-pay | Admitting: Physician Assistant

## 2019-11-20 DIAGNOSIS — I10 Essential (primary) hypertension: Secondary | ICD-10-CM

## 2019-11-22 ENCOUNTER — Encounter: Payer: Self-pay | Admitting: Nurse Practitioner

## 2019-11-22 ENCOUNTER — Ambulatory Visit (INDEPENDENT_AMBULATORY_CARE_PROVIDER_SITE_OTHER): Payer: No Typology Code available for payment source | Admitting: Nurse Practitioner

## 2019-11-22 ENCOUNTER — Other Ambulatory Visit: Payer: Self-pay

## 2019-11-22 VITALS — BP 122/78 | Temp 98.0°F | Wt 211.6 lb

## 2019-11-22 DIAGNOSIS — I10 Essential (primary) hypertension: Secondary | ICD-10-CM

## 2019-11-22 DIAGNOSIS — Z79899 Other long term (current) drug therapy: Secondary | ICD-10-CM

## 2019-11-22 DIAGNOSIS — F909 Attention-deficit hyperactivity disorder, unspecified type: Secondary | ICD-10-CM | POA: Insufficient documentation

## 2019-11-22 MED ORDER — AMLODIPINE BESYLATE 5 MG PO TABS: 5.0000 mg | ORAL_TABLET | Freq: Every day | ORAL | 0 refills | Status: DC

## 2019-11-22 MED ORDER — AMPHETAMINE-DEXTROAMPHETAMINE 10 MG PO TABS
ORAL_TABLET | ORAL | 0 refills | Status: DC
Start: 2019-11-22 — End: 2019-12-21

## 2019-11-22 MED FILL — AMPHETAMINE SALTS 10 MG: 10 | 30 days supply | Qty: 60 | Fill #0

## 2019-11-22 MED FILL — AMLODIPINE BESYLATE 5 MG TA: 5 | 90 days supply | Qty: 90 | Fill #0

## 2019-11-22 NOTE — Progress Notes (Addendum)
   Subjective:    Patient ID: Hailey Holden, female    DOB: Sep 21, 1985, 34 y.o.   MRN: [id number]  HPI Pt here for follow up. Pt was going to [facility] and they decreased Amlodipine 5 mg. Pt no longer to [facility] and is needing refills. Checks BP outside of the office: runs 120s/70s.   Focus; maybe stress. Not able to focus anymore.  Has had problems all of her life but has been getting worse. Has tried her usual techniques such as keeping notes but that is no longer working. Feels overwhelmed most of the time. Very easily distracted both at home and at work. Is currently in school taking anatomy and physiology. 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  Denies any history of cardiac issues.  GAD 7 : Generalized Anxiety Score 11/22/2019  Nervous, Anxious, on Edge 1  Control/stop worrying 0  Worry too much - different things 0  Trouble relaxing 1  Restless 1  Easily annoyed or irritable 1  Afraid - awful might happen 0  Total GAD 7 Score 4  Anxiety Difficulty Somewhat difficult  See ASRS results.   Review of Systems     Objective:   Physical Exam NAD. Alert, oriented. Lungs clear. Heart RRR. No murmur noted. EKG NSR with HR 94.  Cheerful, calm affect. Making good eye contact.  Today's Vitals   11/22/19 1521  BP: 122/78  Temp: 98 F (36.7 C)  Weight: 211 lb 9.6 oz (96 kg)   Body mass index is 36.32 kg/m.      Assessment & Plan:   Problem List Items Addressed This Visit      Cardiovascular and Mediastinum   Essential hypertension - Primary   Relevant Medications   amLODipine (NORVASC) 5 MG tablet     Other   Adult ADHD    Other Visit Diagnoses    High risk medication use       Relevant Orders   EKG 12-Lead     Meds ordered this encounter  Medications  . amLODipine (NORVASC) 5 MG tablet    Sig: Take 1 tablet (5 mg total) by mouth daily.    Dispense:  90 tablet    Refill:  0    Order Specific  Question:   Supervising Provider    Answer:   Lilyan Punt A [[id number]]  . amphetamine-dextroamphetamine (ADDERALL) 10 MG tablet    Sig: Take one tab in the am then 1/2 tab every 4 hours x 2 doses    Dispense:  60 tablet    Refill:  0    Order Specific Question:   Supervising Provider    Answer:   Lilyan Punt A [[id number]]   Continue reduced dose of Norvasc. Continue to monitor BP and pulse outside of the office.  Trial of low dose Adderall for ADHD. Based on her schedule for work and school, will use short acting which will also be one copay. Avoid taking last dose after 4 pm. Patient verbalizes understanding. Take the third dose only on days that she needs it. Discussed potential adverse effects. Contact office in one month to let us know how the medicine and dose are working.  Return in about 3 months (around 02/22/2020) for ADD check up. 30 minutes was spent with the patient including previsit chart review, time spent with patient, discussion of health issues, review of data including medical record, and documentation of the visit.

## 2019-11-23 ENCOUNTER — Encounter: Payer: Self-pay | Admitting: Nurse Practitioner

## 2019-12-02 MED FILL — ALBUTEROL SULFATE HFA 108 (: 108 (90 BAS | 17 days supply | Qty: 9 | Fill #4

## 2019-12-04 ENCOUNTER — Encounter: Payer: Self-pay | Admitting: Family Medicine

## 2019-12-11 ENCOUNTER — Other Ambulatory Visit: Payer: Self-pay | Admitting: Adult Health

## 2019-12-11 MED FILL — SYNTHROID 25 MCG TABLET: 25 | 90 days supply | Qty: 90 | Fill #0

## 2019-12-20 ENCOUNTER — Encounter: Payer: Self-pay | Admitting: Nurse Practitioner

## 2019-12-21 ENCOUNTER — Other Ambulatory Visit: Payer: Self-pay | Admitting: Nurse Practitioner

## 2019-12-21 MED ORDER — AMPHETAMINE-DEXTROAMPHETAMINE 10 MG PO TABS: ORAL_TABLET | ORAL | 0 refills | Status: DC

## 2019-12-21 MED FILL — AMPHETAMINE SALTS 10 MG: 10 | 30 days supply | Qty: 60 | Fill #0

## 2019-12-26 MED FILL — DULoxetine HCL 60 MG CPEP: 60 | 90 days supply | Qty: 90 | Fill #0

## 2019-12-26 MED FILL — ALBUTEROL SULFATE HFA 108 (: 108 (90 BAS | 17 days supply | Qty: 9 | Fill #5

## 2019-12-27 ENCOUNTER — Encounter: Payer: Self-pay | Admitting: Nurse Practitioner

## 2019-12-28 ENCOUNTER — Encounter: Payer: Self-pay | Admitting: Nurse Practitioner

## 2020-02-25 ENCOUNTER — Other Ambulatory Visit: Payer: Self-pay | Admitting: Adult Health

## 2020-02-25 ENCOUNTER — Encounter: Payer: Self-pay | Admitting: Nurse Practitioner

## 2020-02-25 ENCOUNTER — Telehealth: Payer: Self-pay | Admitting: *Deleted

## 2020-02-25 MED ORDER — LEVOTHYROXINE SODIUM 25 MCG PO TABS: ORAL_TABLET | ORAL | 1 refills | Status: DC

## 2020-02-25 MED ORDER — ALBUTEROL SULFATE HFA 108 (90 BASE) MCG/ACT IN AERS: INHALATION_SPRAY | RESPIRATORY_TRACT | 6 refills | Status: DC

## 2020-02-25 MED ORDER — SILVER SULFADIAZINE 1 % EX CREA: 1.0000 "application " | TOPICAL_CREAM | Freq: Two times a day (BID) | CUTANEOUS | 1 refills | Status: DC

## 2020-02-25 NOTE — Progress Notes (Signed)
Refills sent

## 2020-02-25 NOTE — Telephone Encounter (Signed)
Refilled meds to [facility] pharmacy

## 2020-02-25 NOTE — Addendum Note (Signed)
Addended by: Cyril Mourning A on: 02/25/2020 03:37 PM   Modules accepted: Orders

## 2020-02-25 NOTE — Telephone Encounter (Signed)
Tammy with [facility] called stating patient has transferred her prescriptions too them from [facility].  Patient states she gets the brand of Synthroid.  Tammy is requesting the scripts for brand Synthroid and Albuterol be sent to [facility].

## 2020-02-27 ENCOUNTER — Other Ambulatory Visit: Payer: Self-pay | Admitting: Nurse Practitioner

## 2020-02-27 DIAGNOSIS — I10 Essential (primary) hypertension: Secondary | ICD-10-CM

## 2020-02-27 MED ORDER — DULOXETINE HCL 60 MG PO CPEP: 60.0000 mg | ORAL_CAPSULE | Freq: Every day | ORAL | 1 refills | Status: DC

## 2020-02-27 MED ORDER — ALBUTEROL SULFATE HFA 108 (90 BASE) MCG/ACT IN AERS: INHALATION_SPRAY | RESPIRATORY_TRACT | 0 refills | Status: DC

## 2020-02-27 MED ORDER — MONTELUKAST SODIUM 10 MG PO TABS: 10.0000 mg | ORAL_TABLET | Freq: Every day | ORAL | 1 refills | Status: DC

## 2020-02-27 MED ORDER — AMLODIPINE BESYLATE 5 MG PO TABS: 5.0000 mg | ORAL_TABLET | Freq: Every day | ORAL | 1 refills | Status: DC

## 2020-02-27 MED ORDER — LEVOTHYROXINE SODIUM 25 MCG PO TABS: ORAL_TABLET | ORAL | 1 refills | Status: DC

## 2020-02-28 ENCOUNTER — Other Ambulatory Visit: Payer: Self-pay | Admitting: Nurse Practitioner

## 2020-02-28 MED ORDER — AMPHETAMINE-DEXTROAMPHETAMINE 10 MG PO TABS: ORAL_TABLET | ORAL | 0 refills | Status: DC

## 2020-03-20 ENCOUNTER — Other Ambulatory Visit: Payer: Self-pay

## 2020-03-20 ENCOUNTER — Encounter: Payer: Self-pay | Admitting: Nurse Practitioner

## 2020-03-20 ENCOUNTER — Ambulatory Visit: Payer: 59 | Admitting: Nurse Practitioner

## 2020-03-20 VITALS — BP 130/82 | HR 134 | Temp 97.6°F | Wt 214.4 lb

## 2020-03-20 DIAGNOSIS — F909 Attention-deficit hyperactivity disorder, unspecified type: Secondary | ICD-10-CM

## 2020-03-20 NOTE — Progress Notes (Signed)
   Subjective:    Patient ID: Hailey Holden, female    DOB: 06-17-1985, 34 y.o.   MRN: [id number]  HPI Pt here for follow up on HTN and ADHD. Doing well on meds. Adderall 20 mg pt can tell a difference; helps pt.  Her pulse is elevated today; states her baseline is around 120. Is not aware of any effects Adderall may have on her pulse.  Denies any SOB, CP or syncope. Has had some mild wheezing with weather change. Used her inhaler this am. Requesting a refill.  Has a slight headache when her medicine wears off in the afternoon, otherwise no other problems. No change in sleep patterns on medication.  Can tell a big difference in her focus on her job when she takes the medicine.    Review of Systems     Objective:   Physical Exam NAD. Alert, oriented. Lungs clear. Heart RRR. Tachycardia noted. No murmur or gallop.  Today's Vitals   03/20/20 0909  BP: 130/82  Pulse: (!) 134  Temp: 97.6 F (36.4 C)  SpO2: 98%  Weight: 214 lb 6.4 oz (97.3 kg)   Body mass index is 36.8 kg/m.        Assessment & Plan:   Problem List Items Addressed This Visit      Other   Adult ADHD - Primary     Continue Adderall as directed.  To monitor pulse while on medication. Contact office if pulse remains above 130 or if symptoms develop.  Has a physical and labs with GYN next week. Return in about 3 months (around 06/18/2020) for ADHD check up.

## 2020-03-24 ENCOUNTER — Other Ambulatory Visit: Payer: Self-pay

## 2020-03-24 ENCOUNTER — Encounter: Payer: Self-pay | Admitting: Women's Health

## 2020-03-24 ENCOUNTER — Ambulatory Visit (INDEPENDENT_AMBULATORY_CARE_PROVIDER_SITE_OTHER): Payer: 59 | Admitting: Women's Health

## 2020-03-24 ENCOUNTER — Other Ambulatory Visit: Payer: No Typology Code available for payment source | Admitting: Adult Health

## 2020-03-24 ENCOUNTER — Other Ambulatory Visit (HOSPITAL_COMMUNITY)
Admission: RE | Admit: 2020-03-24 | Discharge: 2020-03-24 | Disposition: A | Discharge status: Home / Self Care | Payer: 59 | Source: Ambulatory Visit | Attending: Adult Health | Admitting: Adult Health

## 2020-03-24 VITALS — BP 120/80 | HR 129 | Ht 64.0 in | Wt 214.0 lb

## 2020-03-24 DIAGNOSIS — Z01419 Encounter for gynecological examination (general) (routine) without abnormal findings: Secondary | ICD-10-CM | POA: Diagnosis not present

## 2020-03-24 DIAGNOSIS — Z3202 Encounter for pregnancy test, result negative: Secondary | ICD-10-CM

## 2020-03-24 DIAGNOSIS — Z131 Encounter for screening for diabetes mellitus: Secondary | ICD-10-CM | POA: Diagnosis not present

## 2020-03-24 DIAGNOSIS — E063 Autoimmune thyroiditis: Secondary | ICD-10-CM

## 2020-03-24 DIAGNOSIS — Z1321 Encounter for screening for nutritional disorder: Secondary | ICD-10-CM | POA: Diagnosis not present

## 2020-03-24 DIAGNOSIS — Z13220 Encounter for screening for lipoid disorders: Secondary | ICD-10-CM

## 2020-03-24 LAB — POCT URINE PREGNANCY: Preg Test, Ur: NEGATIVE

## 2020-03-24 NOTE — Progress Notes (Signed)
WELL-WOMAN EXAMINATION Patient name: Hailey Holden MRN [id number]  Date of birth: February 04, 1986 Chief Complaint:   Gynecologic Exam (Discuss birth control options)  History of Present Illness:   Hailey Holden is a 34 y.o. G82P2002 Caucasian female being seen today for a routine well-woman exam.  Current complaints: wants birth control, doesn't like hormones, doesn't want IUD. Discussed phexxi, wants to try  Depression screen [facility] 2/9 03/24/2020 11/22/2019 02/05/2019 02/01/2019 05/15/2018  Decreased Interest 0 0 0 0 1  Down, Depressed, Hopeless 0 0 0 0 1  PHQ - 2 Score 0 0 0 0 2  Altered sleeping 1 2 0 2 1  Tired, decreased energy 1 3 0 0 2  Change in appetite 0 1 0 0 2  Feeling bad or failure about yourself  0 0 0 0 1  Trouble concentrating 1 3 0 0 2  Moving slowly or fidgety/restless 0 3 0 0 0  Suicidal thoughts 0 0 0 0 0  PHQ-9 Score 3 12 0 2 10  Difficult doing work/chores - Very difficult - - Not difficult at all     PCP: Hoyle Sauer at [facility]      does desire labs, has been fasting Patient's last menstrual period was 03/05/2020. The current method of family planning is condoms and rhthym method.  Last pap 10/27/16. Results were: normal. H/O abnormal pap: no Last mammogram: 07/30/19 for Rt subareolar mass. Results were: normal. Family h/o breast cancer: yes PA and multiple MGAs Last colonoscopy: never. Results were: N/A. Family h/o colorectal cancer: yes [facility] Review of Systems:   Pertinent items are noted in HPI Denies any headaches, blurred vision, fatigue, shortness of breath, chest pain, abdominal pain, abnormal vaginal discharge/itching/odor/irritation, problems with periods, bowel movements, urination, or intercourse unless otherwise stated above. Pertinent History Reviewed:  Reviewed past medical,surgical, social and family history.  Reviewed problem list, medications and allergies. Physical Assessment:   Vitals:   03/24/20 0955  BP: 120/80  Pulse: (!) 129  Weight: 214 lb  (97.1 kg)  Height: $Remove'5\' 4"'DLcQgAR$  (1.626 m)  Body mass index is 36.73 kg/m.        Physical Examination:   General appearance - well appearing, and in no distress  Mental status - alert, oriented to person, place, and time  Psych:  She has a normal mood and affect  Skin - warm and dry, normal color, no suspicious lesions noted  Chest - effort normal, all lung fields clear to auscultation bilaterally  Heart - normal rate and regular rhythm  Neck:  midline trachea, no thyromegaly or nodules  Breasts - breasts appear normal, no suspicious masses, no skin or nipple changes or  axillary nodes  Abdomen - soft, nontender, nondistended, no masses or organomegaly  Pelvic - VULVA: normal appearing vulva with no masses, tenderness or lesions  VAGINA: normal appearing vagina with normal color and discharge, no lesions  CERVIX: normal appearing cervix without discharge or lesions, no CMT  Thin prep pap is done w/ HR HPV cotesting  UTERUS: uterus is felt to be normal size, shape, consistency and nontender   ADNEXA: No adnexal masses or tenderness noted.  Extremities:  No swelling or varicosities noted  Chaperone: declined    Results for orders placed or performed in visit on 03/24/20 (from the past 24 hour(s))  POCT urine pregnancy   Collection Time: 03/24/20  9:51 AM  Result Value Ref Range   Preg Test, Ur Negative Negative    Assessment & Plan:  1)  Well-Woman Exam  2) Hashimotos disease w/ thyroid nodules> labs today, on synthroid 21mcg, last u/s in 2018- no nodules met criteria for biopsy or surveillance. Pt states monitors nodule frequently- will let us know if feels any changes  3) Contraception management> 2 sample boxes of Phexxi given, discussed use  4) Family h/o breast cancer> discussed [facility] hereditary cancer screening, will let us know if she wants  Labs/procedures today: as below  Mammogram @35yo  or sooner if problems Colonoscopy @35yo  or sooner if problems  Orders Placed This  Encounter  Procedures  . CBC  . Comprehensive metabolic panel  . TSH  . Lipid panel  . Hemoglobin A1c  . VITAMIN D 25 Hydroxy (Vit-D Deficiency, Fractures)  . T4, free  . T3  . POCT urine pregnancy    Meds: No orders of the defined types were placed in this encounter.   Follow-up: Return in about 1 year (around 03/24/2021) for Physical.  [place], [facility] 03/24/2020 10:44 AM

## 2020-03-25 ENCOUNTER — Other Ambulatory Visit: Payer: Self-pay | Admitting: Women's Health

## 2020-03-25 DIAGNOSIS — R7303 Prediabetes: Secondary | ICD-10-CM

## 2020-03-25 DIAGNOSIS — E7800 Pure hypercholesterolemia, unspecified: Secondary | ICD-10-CM

## 2020-03-25 LAB — COMPREHENSIVE METABOLIC PANEL
ALT: 23 IU/L (ref 0–32)
AST: 20 IU/L (ref 0–40)
Albumin/Globulin Ratio: 1.4 (ref 1.2–2.2)
Albumin: 4.1 g/dL (ref 3.8–4.8)
Alkaline Phosphatase: 117 IU/L (ref 44–121)
BUN/Creatinine Ratio: 8 — ABNORMAL LOW (ref 9–23)
BUN: 5 mg/dL — ABNORMAL LOW (ref 6–20)
Bilirubin Total: 0.7 mg/dL (ref 0.0–1.2)
CO2: 21 mmol/L (ref 20–29)
Calcium: 9.1 mg/dL (ref 8.7–10.2)
Chloride: 104 mmol/L (ref 96–106)
Creatinine, Ser: 0.66 mg/dL (ref 0.57–1.00)
GFR calc Af Amer: 132 mL/min/{1.73_m2} (ref >59)
GFR calc non Af Amer: 115 mL/min/{1.73_m2} (ref >59)
Globulin, Total: 2.9 g/dL (ref 1.5–4.5)
Glucose: 85 mg/dL (ref 65–99)
Potassium: 4.3 mmol/L (ref 3.5–5.2)
Sodium: 139 mmol/L (ref 134–144)
Total Protein: 7 g/dL (ref 6.0–8.5)

## 2020-03-25 LAB — CBC
Hematocrit: 38.8 % (ref 34.0–46.6)
Hemoglobin: 13.2 g/dL (ref 11.1–15.9)
MCH: 28.6 pg (ref 26.6–33.0)
MCHC: 34 g/dL (ref 31.5–35.7)
MCV: 84 fL (ref 79–97)
Platelets: 414 10*3/uL (ref 150–450)
RBC: 4.62 x10E6/uL (ref 3.77–5.28)
RDW: 13 % (ref 11.7–15.4)
WBC: 9.7 10*3/uL (ref 3.4–10.8)

## 2020-03-25 LAB — HEMOGLOBIN A1C
Est. average glucose Bld gHb Est-mCnc: 117 mg/dL
Hgb A1c MFr Bld: 5.7 % — ABNORMAL HIGH (ref 4.8–5.6)

## 2020-03-25 LAB — T4, FREE: Free T4: 1.25 ng/dL (ref 0.82–1.77)

## 2020-03-25 LAB — LIPID PANEL
Chol/HDL Ratio: 6.2 ratio — ABNORMAL HIGH (ref 0.0–4.4)
Cholesterol, Total: 240 mg/dL — ABNORMAL HIGH (ref 100–199)
HDL: 39 mg/dL — ABNORMAL LOW (ref >39)
LDL Chol Calc (NIH): 176 mg/dL — ABNORMAL HIGH (ref 0–99)
Triglycerides: 135 mg/dL (ref 0–149)
VLDL Cholesterol Cal: 25 mg/dL (ref 5–40)

## 2020-03-25 LAB — TSH: TSH: 1.75 u[IU]/mL (ref 0.450–4.500)

## 2020-03-25 LAB — CYTOLOGY - PAP
Comment: NEGATIVE
Diagnosis: NEGATIVE
High risk HPV: NEGATIVE

## 2020-03-25 LAB — T3: T3, Total: 136 ng/dL (ref 71–180)

## 2020-03-25 LAB — VITAMIN D 25 HYDROXY (VIT D DEFICIENCY, FRACTURES): Vit D, 25-Hydroxy: 36.9 ng/mL (ref 30.0–100.0)

## 2020-04-29 ENCOUNTER — Encounter: Payer: Self-pay | Admitting: Nurse Practitioner

## 2020-05-01 ENCOUNTER — Encounter: Payer: Self-pay | Admitting: Nurse Practitioner

## 2020-05-01 ENCOUNTER — Other Ambulatory Visit: Payer: Self-pay | Admitting: *Deleted

## 2020-05-01 DIAGNOSIS — I10 Essential (primary) hypertension: Secondary | ICD-10-CM

## 2020-05-01 MED ORDER — MONTELUKAST SODIUM 10 MG PO TABS: 10.0000 mg | ORAL_TABLET | Freq: Every day | ORAL | 1 refills | Status: DC

## 2020-05-01 MED ORDER — AMLODIPINE BESYLATE 5 MG PO TABS: 5.0000 mg | ORAL_TABLET | Freq: Every day | ORAL | 0 refills | Status: DC

## 2020-05-01 MED ORDER — DULOXETINE HCL 60 MG PO CPEP: 60.0000 mg | ORAL_CAPSULE | Freq: Every day | ORAL | 1 refills | Status: DC

## 2020-05-01 MED ORDER — LEVOTHYROXINE SODIUM 25 MCG PO TABS: ORAL_TABLET | ORAL | 1 refills | Status: DC

## 2020-05-04 ENCOUNTER — Other Ambulatory Visit: Payer: Self-pay | Admitting: *Deleted

## 2020-05-04 MED ORDER — ALBUTEROL SULFATE HFA 108 (90 BASE) MCG/ACT IN AERS: INHALATION_SPRAY | RESPIRATORY_TRACT | 3 refills | Status: DC

## 2020-05-18 ENCOUNTER — Other Ambulatory Visit: Payer: Self-pay | Admitting: Nurse Practitioner

## 2020-05-18 ENCOUNTER — Other Ambulatory Visit: Payer: Self-pay | Admitting: Women's Health

## 2020-05-18 MED ORDER — PREDNISONE 10 MG (21) PO TBPK
ORAL_TABLET | ORAL | 0 refills | Status: DC
Start: 2020-05-18 — End: 2020-11-06

## 2020-05-18 NOTE — Progress Notes (Signed)
Pt contacted me with report of asthma exacerbation. No covid sx. Requesting prednisone dose pack. Rx sent.  Cheral Marker, CNM, [facility] 05/18/2020 2:51 PM

## 2020-06-19 ENCOUNTER — Ambulatory Visit: Payer: 59 | Admitting: Nurse Practitioner

## 2020-07-06 ENCOUNTER — Encounter: Payer: Self-pay | Admitting: Nurse Practitioner

## 2020-07-07 ENCOUNTER — Other Ambulatory Visit: Payer: Self-pay | Admitting: Nurse Practitioner

## 2020-07-07 MED ORDER — AMPHETAMINE-DEXTROAMPHETAMINE 10 MG PO TABS: ORAL_TABLET | ORAL | 0 refills | Status: DC

## 2020-07-07 NOTE — Telephone Encounter (Signed)
Patient made an appt for 07/24/20

## 2020-07-24 ENCOUNTER — Telehealth: Payer: Self-pay | Admitting: *Deleted

## 2020-07-24 ENCOUNTER — Other Ambulatory Visit: Payer: Self-pay

## 2020-07-24 ENCOUNTER — Telehealth (INDEPENDENT_AMBULATORY_CARE_PROVIDER_SITE_OTHER): Payer: Managed Care, Other (non HMO) | Admitting: Nurse Practitioner

## 2020-07-24 DIAGNOSIS — F909 Attention-deficit hyperactivity disorder, unspecified type: Secondary | ICD-10-CM | POA: Diagnosis not present

## 2020-07-24 MED ORDER — AMPHETAMINE-DEXTROAMPHETAMINE 10 MG PO TABS: ORAL_TABLET | ORAL | 0 refills | Status: DC

## 2020-07-24 NOTE — Progress Notes (Signed)
Subjective:    Patient ID: Hailey Holden, female    DOB: 01/05/1986, 35 y.o.   MRN: [id number]  HPI  Patient calls for follow up ADHD. Current medication is helping but not as much as she had hoped. Virtual Visit via Telephone Note  I connected with Hailey Holden on 07/24/20 at  3:20 PM EDT by telephone and verified that I am speaking with the correct person using two identifiers.  Location: Patient: home Provider: office   I discussed the limitations, risks, security and privacy concerns of performing an evaluation and management service by telephone and the availability of in person appointments. I also discussed with the patient that there may be a patient responsible charge related to this service. The patient expressed understanding and agreed to proceed. The morning dose of her medication is working fine but is tapering off and not as effective with the second dose which she takes 4 to 5 hours after the first dose.  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   Compliant with medication: yes  Difficulty completing tasks or focusing: fine with first dose; less with second  Side effects: none  Appetite: no change  Sleep: no change  Additional issues or questions: none   Observations/Objective: Today's visit was via telephone Physical exam was not possible for this visit Alert, oriented.  Cheerful affect.  Thoughts logical coherent and relevant.  Speech clear.  Assessment and Plan: Problem List Items Addressed This Visit      Other   Adult ADHD - Primary     Meds ordered this encounter  Medications  . DISCONTD: amphetamine-dextroamphetamine (ADDERALL) 10 MG tablet    Sig: TAKE 1 TABLET BY MOUTH IN THE MORNING THEN 1 TAB 4 HRS LATER THEN 1/2 TAB IN 4 HRS    Dispense:  75 tablet    Refill:  0    Order Specific Question:   Supervising Provider    Answer:   Lilyan Punt A [[id number]]  . DISCONTD: amphetamine-dextroamphetamine (ADDERALL) 10 MG  tablet    Sig: TAKE 1 TABLET BY MOUTH IN THE MORNING THEN 1 TAB 4 HRS LATER THEN 1/2 TAB IN 4 HRS    Dispense:  75 tablet    Refill:  0    May fill 30 days from 07/24/20    Order Specific Question:   Supervising Provider    Answer:   Lilyan Punt A [[id number]]  . amphetamine-dextroamphetamine (ADDERALL) 10 MG tablet    Sig: TAKE 1 TABLET BY MOUTH IN THE MORNING THEN 1 TAB 4 HRS LATER THEN 1/2 TAB IN 4 HRS    Dispense:  75 tablet    Refill:  0    May fill 60 days from 07/24/20    Order Specific Question:   Supervising Provider    Answer:   Lilyan Punt A [[id number]]    Follow Up Instructions: Increase second dose of the day to 10 mg, continue other doses as previously prescribed. Go back to previous dosing if any adverse effects. Recheck in 3 months.   I discussed the assessment and treatment plan with the patient. The patient was provided an opportunity to ask questions and all were answered. The patient agreed with the plan and demonstrated an understanding of the instructions.   The patient was advised to call back or seek an in-person evaluation if the symptoms worsen or if the condition fails to improve as anticipated.  I provided 15 minutes of non-face-to-face time during this  encounter.     Review of Systems     Objective:   Physical Exam        Assessment & Plan:

## 2020-07-24 NOTE — Telephone Encounter (Signed)
[name], cutrone are scheduled for a virtual visit with your provider today.    Just as we do with appointments in the office, we must obtain your consent to participate.  Your consent will be active for this visit and any virtual visit you may have with one of our providers in the next 365 days.    If you have a MyChart account, I can also send a copy of this consent to you electronically.  All virtual visits are billed to your insurance company just like a traditional visit in the office.  As this is a virtual visit, video technology does not allow for your provider to perform a traditional examination.  This may limit your provider's ability to fully assess your condition.  If your provider identifies any concerns that need to be evaluated in person or the need to arrange testing such as labs, EKG, etc, we will make arrangements to do so.    Although advances in technology are sophisticated, we cannot ensure that it will always work on either your end or our end.  If the connection with a video visit is poor, we may have to switch to a telephone visit.  With either a video or telephone visit, we are not always able to ensure that we have a secure connection.   I need to obtain your verbal consent now.   Are you willing to proceed with your visit today?   Hailey Holden has provided verbal consent on 07/24/2020 for a virtual visit (video or telephone).

## 2020-07-25 ENCOUNTER — Encounter: Payer: Self-pay | Admitting: Nurse Practitioner

## 2020-07-31 ENCOUNTER — Encounter: Payer: Self-pay | Admitting: Nurse Practitioner

## 2020-08-01 ENCOUNTER — Other Ambulatory Visit: Payer: Self-pay | Admitting: Nurse Practitioner

## 2020-08-01 DIAGNOSIS — I10 Essential (primary) hypertension: Secondary | ICD-10-CM

## 2020-08-01 MED ORDER — AMLODIPINE BESYLATE 5 MG PO TABS: 5.0000 mg | ORAL_TABLET | Freq: Every day | ORAL | 1 refills | Status: DC

## 2020-08-01 MED ORDER — MONTELUKAST SODIUM 10 MG PO TABS: 10.0000 mg | ORAL_TABLET | Freq: Every day | ORAL | 1 refills | Status: DC

## 2020-08-01 MED ORDER — DULOXETINE HCL 60 MG PO CPEP: 60.0000 mg | ORAL_CAPSULE | Freq: Every day | ORAL | 1 refills | Status: DC

## 2020-08-01 MED ORDER — LEVOTHYROXINE SODIUM 25 MCG PO TABS: ORAL_TABLET | ORAL | 1 refills | Status: DC

## 2020-09-11 ENCOUNTER — Other Ambulatory Visit: Payer: Self-pay | Admitting: Nurse Practitioner

## 2020-10-13 ENCOUNTER — Telehealth: Payer: Self-pay | Admitting: Adult Health

## 2020-10-13 DIAGNOSIS — Z3201 Encounter for pregnancy test, result positive: Secondary | ICD-10-CM

## 2020-10-13 NOTE — Telephone Encounter (Signed)
Roena had +HPT, on adderall and cymblata, can stop adderal  but has to wean cymblata. Will check QHCG and progesterone level

## 2020-10-14 ENCOUNTER — Telehealth: Payer: Self-pay | Admitting: Adult Health

## 2020-10-14 DIAGNOSIS — Z3201 Encounter for pregnancy test, result positive: Secondary | ICD-10-CM

## 2020-10-14 LAB — PROGESTERONE: Progesterone: 5.5 ng/mL

## 2020-10-14 LAB — BETA HCG QUANT (REF LAB): hCG Quant: 808 m[IU]/mL

## 2020-10-14 MED ORDER — PROGESTERONE 200 MG PO CAPS: ORAL_CAPSULE | ORAL | 3 refills | Status: DC

## 2020-10-14 NOTE — Telephone Encounter (Signed)
Hailey Holden is aware of labs, will recheck [facility] in am and will rx Prometrium 200 mg 1 in vagina at hs

## 2020-10-16 ENCOUNTER — Encounter: Payer: Self-pay | Admitting: Nurse Practitioner

## 2020-10-16 ENCOUNTER — Other Ambulatory Visit: Payer: Self-pay | Admitting: Adult Health

## 2020-10-16 DIAGNOSIS — Z3201 Encounter for pregnancy test, result positive: Secondary | ICD-10-CM

## 2020-10-16 LAB — BETA HCG QUANT (REF LAB): hCG Quant: 975 m[IU]/mL

## 2020-10-16 NOTE — Progress Notes (Signed)
Ck QHCG  

## 2020-10-20 ENCOUNTER — Other Ambulatory Visit: Payer: Self-pay | Admitting: *Deleted

## 2020-10-20 ENCOUNTER — Other Ambulatory Visit: Payer: Self-pay | Admitting: Women's Health

## 2020-10-20 DIAGNOSIS — O200 Threatened abortion: Secondary | ICD-10-CM

## 2020-10-20 LAB — BETA HCG QUANT (REF LAB): hCG Quant: 1317 m[IU]/mL

## 2020-10-20 MED ORDER — DULOXETINE HCL 30 MG PO CPEP: 30.0000 mg | ORAL_CAPSULE | Freq: Every day | ORAL | 0 refills | Status: DC

## 2020-10-27 ENCOUNTER — Ambulatory Visit (INDEPENDENT_AMBULATORY_CARE_PROVIDER_SITE_OTHER): Payer: Managed Care, Other (non HMO)

## 2020-10-27 ENCOUNTER — Other Ambulatory Visit: Payer: Self-pay

## 2020-10-27 DIAGNOSIS — O200 Threatened abortion: Secondary | ICD-10-CM | POA: Diagnosis not present

## 2020-10-27 LAB — BETA HCG QUANT (REF LAB): hCG Quant: 2187 m[IU]/mL

## 2020-10-27 NOTE — Progress Notes (Signed)
[name] 7 wks,single IUP,FHR 126 bpm,normal ovaries,GS 9 mm=5+5 wks,large yolk sac 4.6 mm,normal ovaries,f/u ultrasound in 10 days per Victorino Dike

## 2020-11-04 ENCOUNTER — Other Ambulatory Visit: Payer: Self-pay | Admitting: Adult Health

## 2020-11-04 DIAGNOSIS — O3680X0 Pregnancy with inconclusive fetal viability, not applicable or unspecified: Secondary | ICD-10-CM

## 2020-11-06 ENCOUNTER — Ambulatory Visit: Payer: Managed Care, Other (non HMO) | Admitting: Women's Health

## 2020-11-06 ENCOUNTER — Other Ambulatory Visit: Payer: Self-pay | Admitting: Adult Health

## 2020-11-06 ENCOUNTER — Ambulatory Visit (INDEPENDENT_AMBULATORY_CARE_PROVIDER_SITE_OTHER): Payer: Managed Care, Other (non HMO)

## 2020-11-06 ENCOUNTER — Encounter: Payer: Self-pay | Admitting: Women's Health

## 2020-11-06 ENCOUNTER — Other Ambulatory Visit: Payer: Self-pay

## 2020-11-06 VITALS — BP 142/85 | HR 116 | Ht 64.0 in | Wt 210.0 lb

## 2020-11-06 DIAGNOSIS — O3680X0 Pregnancy with inconclusive fetal viability, not applicable or unspecified: Secondary | ICD-10-CM

## 2020-11-06 DIAGNOSIS — O021 Missed abortion: Secondary | ICD-10-CM

## 2020-11-06 DIAGNOSIS — Z3A08 8 weeks gestation of pregnancy: Secondary | ICD-10-CM

## 2020-11-06 NOTE — Patient Instructions (Signed)
FACTS YOU SHOULD KNOW  About Early Pregnancy Loss  WHAT IS AN EARLY PREGNANCY LOSS? Once the egg is fertilized with the sperm and begins to develop, it attaches to the lining of the uterus. This early pregnancy tissue may not develop into an embryo (the beginning stage of a baby). Sometimes an embryo does develop but does not continue to grow. These problems can be seen on ultrasound.  Many women experience the loss of a pregnancy during their lifetime.  As many as 10-30% of pregnancies end in pregnancy loss within the first trimester (or first 12-14 weeks).  MANAGEMNT OF EARLY PREGNANCY LOSS: There are 3 ways to care for an early pregnancy loss:   Surgery, (2) Medicine, (3) Waiting for you to pass the pregnancy on your own. The decision as to how to proceed after being diagnosed with and early pregnancy loss is an individual one.  The decision can be made only after appropriate counseling.  You need to weigh the pros and cons of the 3 choices. Then you can make the choice that works for you.  SURGERY (D&E) Procedure over in 1 day Requires being put to sleep Bleeding may be light Possible problems during surgery, including injury to womb(uterus) Care provider has more control Medicine (CYTOTEC) The complete procedure may take days to weeks No Surgery Bleeding may be heavy at times There may be drug side effects Patient has more control Waiting You may choose to wait, in which case your own body may complete the passing of the abnormal early pregnancy on its own in about 2-4 weeks Your bleeding may be heavy at times There is a small possibility that you may need surgery if the bleeding is too much or not all of the pregnancy has passed.  CYTOTEC MANAGEMENT Prostaglandins (cytotec) are the most widely used drug for this purpose. They cause the uterus to cramp and contract. You will place the medicine yourself inside your vagina in the privacy of your home. Empting of the uterus should  occur within 3 days but the process may continue for several weeks. The bleeding may seem heavy at times.  INSTRUCTIONS: Take all 4 tablets of cytotec ( total) at one time. This will cause a lot of cramping, you may have bleeding, and pass tissue, then the cramping and bleeding should get better. If you do not pass the tissue, then you can take 4 more tablets of cytotec ( total) 48 hours after your first dose.  You will come back to have your blood drawn to make sure the pregnancy hormones are dropping in 1 week. Please call us if you have any questions.   POSSIBLE SIDE EFFECTS FROM CYTOTEC Nausea  Vomiting Diarrhea Fever Chills  Hot Flashes Side effects  from the process of the early pregnancy loss include: Cramping  Bleeding Headaches  Dizziness RISKS: This is a low risk procedure. Less than 1 in 100 women has a complication. An incomplete passage of the early pregnancy may occur. Also, hemorrhage (heavy bleeding) could happen.  Rarely the pregnancy will not be passed completely. Excessively heavy bleeding may occur.  Your doctor may need to perform surgery to empty the uterus (D&E). Afterwards: Everybody will feel differently after the early pregnancy loss completion. You may have soreness or cramps for a day or two. You may have soreness or cramps for day or two.  You may have light bleeding for up to 2 weeks. You may be as active as you feel like being. If you  have any of the following problems you may call [facility] at [phone] or Maternity Admissions Unit at [phone] if it is after hours. If you have pain that does not get better with pain medication Bleeding that soaks through 2 thick full-sized sanitary pads in an hour Cramps that last longer than 2 days Foul smelling discharge Fever above 100.4 degrees F Even if you do not have any of these symptoms, you should have a follow-up exam to make sure you are healing properly. Your next normal period will usually start  again in 4-6 week after the loss. You can get pregnant soon after the loss, so use birth control right away. Finally: Make sure all your questions are answered before during and after any procedure. Follow up with medical care and family planning methods.     

## 2020-11-06 NOTE — Progress Notes (Signed)
   GYN VISIT Patient name: Hailey Holden MRN [id number]  Date of birth: April 28, 1985 Chief Complaint:   Follow-up (ultrasound)  History of Present Illness:   Hailey Holden is a 35 y.o. G72P2002 Caucasian female at [redacted]w[redacted]d being seen today for no FCA on u/s today.   She had a dating u/s 7/26 w/ large yolk sac 4.67mm, GS c/w [redacted]w[redacted]d, and CRL c/w [redacted]w[redacted]d w/ FHR 124. Inappropriate rise in HCGs 808 (7/12), 975 (7/14), 1317 (7/18), 2187 (7/25). Progesterone was low at 5.5 (7/12), so she was taking prometrium nightly. No bleeding/cramping.  Patient's last menstrual period was 03/05/2020.  Depression screen [facility] 2/9 07/24/2020 03/24/2020 11/22/2019 02/05/2019 02/01/2019  Decreased Interest 0 0 0 0 0  Down, Depressed, Hopeless 0 0 0 0 0  PHQ - 2 Score 0 0 0 0 0  Altered sleeping - 1 2 0 2  Tired, decreased energy - 1 3 0 0  Change in appetite - 0 1 0 0  Feeling bad or failure about yourself  - 0 0 0 0  Trouble concentrating - 1 3 0 0  Moving slowly or fidgety/restless - 0 3 0 0  Suicidal thoughts - 0 0 0 0  PHQ-9 Score - 3 12 0 2  Difficult doing work/chores - - Very difficult - -     GAD 7 : Generalized Anxiety Score 03/24/2020 11/22/2019  Nervous, Anxious, on Edge 0 1  Control/stop worrying 0 0  Worry too much - different things 0 0  Trouble relaxing 0 1  Restless 0 1  Easily annoyed or irritable 0 1  Afraid - awful might happen 0 0  Total GAD 7 Score 0 4  Anxiety Difficulty - Somewhat difficult     Review of Systems:   Pertinent items are noted in HPI Denies fever/chills, dizziness, headaches, visual disturbances, fatigue, shortness of breath, chest pain, abdominal pain, vomiting, abnormal vaginal discharge/itching/odor/irritation, problems with periods, bowel movements, urination, or intercourse unless otherwise stated above.  Pertinent History Reviewed:  Reviewed past medical,surgical, social, obstetrical and family history.  Reviewed problem list, medications and allergies. Physical  Assessment:   Vitals:   11/06/20 0959  BP: (!) 142/85  Pulse: (!) 116  Weight: 210 lb (95.3 kg)  Height: 5\' 4"  (1.626 m)  Body mass index is 36.05 kg/m.       Physical Examination:   General appearance: alert, well appearing, and in no distress  Mental status: alert, oriented to person, place, and time  Skin: warm & dry   Cardiovascular: normal heart rate noted  Respiratory: normal respiratory effort, no distress  Abdomen: soft, non-tender   Pelvic: examination not indicated  Extremities: no edema   Chaperone: N/A    No results found for this or any previous visit (from the past 24 hour(s)).  Assessment & Plan:  1) [name]>[redacted]w[redacted]d by u/s, today's CRL c/w [redacted]w[redacted]d now w/o FCA. Discussed options of expectant management vs. cytotec vs. D&C. Discussed what to expect with miscarriage as well as warning s/s to report, reasons to seek care. Printed information given about all options. Pt prefers expectant management for now. Blood type: O+.  F/U in 1 week.  Meds: No orders of the defined types were placed in this encounter.   No orders of the defined types were placed in this encounter.   Return in about 1 week (around 11/13/2020) for miscarriage f/u w/ Jenn only- double book if needed.  01/13/2021 CNM, [facility] 11/06/2020 10:18 AM

## 2020-11-13 ENCOUNTER — Encounter: Payer: Self-pay | Admitting: Adult Health

## 2020-11-13 ENCOUNTER — Ambulatory Visit (INDEPENDENT_AMBULATORY_CARE_PROVIDER_SITE_OTHER): Payer: Managed Care, Other (non HMO) | Admitting: Adult Health

## 2020-11-13 ENCOUNTER — Other Ambulatory Visit: Payer: Self-pay

## 2020-11-13 VITALS — BP 142/88 | HR 140 | Ht 64.0 in | Wt 215.0 lb

## 2020-11-13 DIAGNOSIS — O039 Complete or unspecified spontaneous abortion without complication: Secondary | ICD-10-CM | POA: Insufficient documentation

## 2020-11-13 NOTE — Progress Notes (Signed)
  Subjective:     Patient ID: Hailey Holden, female   DOB: 1985/12/30, 35 y.o.   MRN: [id number]  HPI Edit is a 35 year old white female, married, back in follow on missed ab, and she passed the sac and fetal pole yesterday, has bleeding and cramping. Blood type is O+. PCP is Dr Ladona Ridgel  Review of Systems +bleeding with tissue passage Reviewed past medical,surgical, social and family history. Reviewed medications and allergies.     Objective:   Physical Exam BP (!) 142/88 (BP Location: Left Arm, Patient Position: Sitting, Cuff Size: Large)   Pulse (!) 140   Ht 5\' 4"  (1.626 m)   Wt 215 lb (97.5 kg)   LMP 03/05/2020 Comment: miscarriage  Breastfeeding Unknown   BMI 36.90 kg/m     Skin warm and dry. Lungs: clear to ausculation bilaterally. Cardiovascular: regular rate and rhythm.  Fall risk is low  [facility] - 11/13/20 0935       Pregnancy Intention Screening   Does the patient want to become pregnant in the next year? No    Does the patient's partner want to become pregnant in the next year? No    Would the patient like to discuss contraceptive options today? Yes      Contraception Wrap Up   Current Method Abstinence    End Method Oral Contraceptive;Abstinence    Contraception Counseling Provided Yes             Assessment:     1. Miscarriage Will check [facility] today Will start probably [place], when [facility] at 5 or less No sex for now   Discussed may be teary or moody in next few days Plan:     Follow up with me in 2 weeks or sooner if needed

## 2020-11-14 LAB — BETA HCG QUANT (REF LAB): hCG Quant: 571 m[IU]/mL

## 2020-11-23 ENCOUNTER — Other Ambulatory Visit: Payer: Self-pay | Admitting: Adult Health

## 2020-11-23 DIAGNOSIS — O039 Complete or unspecified spontaneous abortion without complication: Secondary | ICD-10-CM

## 2020-11-23 NOTE — Progress Notes (Signed)
Ck QHCG  

## 2020-12-02 ENCOUNTER — Telehealth (INDEPENDENT_AMBULATORY_CARE_PROVIDER_SITE_OTHER): Payer: Managed Care, Other (non HMO) | Admitting: Adult Health

## 2020-12-02 ENCOUNTER — Encounter: Payer: Self-pay | Admitting: Adult Health

## 2020-12-02 DIAGNOSIS — Z30011 Encounter for initial prescription of contraceptive pills: Secondary | ICD-10-CM | POA: Diagnosis not present

## 2020-12-02 DIAGNOSIS — O039 Complete or unspecified spontaneous abortion without complication: Secondary | ICD-10-CM

## 2020-12-02 LAB — BETA HCG QUANT (REF LAB): hCG Quant: <1 m[IU]/mL

## 2020-12-02 MED ORDER — NORETHINDRONE 0.35 MG PO TABS: 1.0000 | ORAL_TABLET | Freq: Every day | ORAL | 11 refills | Status: DC

## 2020-12-02 NOTE — Progress Notes (Signed)
Patient ID: Hailey Holden, female   DOB: [birth year], 35 y.o.   MRN: [id number]   TELEHEALTH GYNECOLOGY VISIT ENCOUNTER NOTE  [facility]: [facility]   Patient location: Home  I connected with Hailey Holden on 12/02/20 at 11:10 AM EDT by telephone and verified that I am speaking with the correct person using two identifiers. Patient was unable to do MyChart audiovisual encounter due to technical difficulties, she tried several times.    I discussed the limitations, risks, security and privacy concerns of performing an evaluation and management service by telephone and the availability of in person appointments. I also discussed with the patient that there may be a patient responsible charge related to this service. The patient expressed understanding and agreed to proceed.   History:  Hailey Holden is a 35 y.o. [phone] female being evaluated today for miscarriage. She denies any abnormal vaginal discharge, bleeding, pelvic pain or other concerns.    QHCG was <1 12/01/20.   Past Medical History:  Diagnosis Date   Allergies    Asthma    Back pain    Breast nodule 02/04/2014   Constipation    Constipation    Depression    Dizziness    Dry skin    Easy bruising    Encounter for other contraceptive management 10/21/2014   Enlarged thyroid 09/27/2012   Fatigue    Fatty liver    Fibromyalgia    Floaters in visual field    Glands swollen    Hashimoto's disease    Headache    Heat intolerance    Hidradenitis    High cholesterol    Hoarseness    Hypertension    Hypothyroidism    IBS (irritable bowel syndrome)    Joint pain    Leg cramping    Migraine headache    Miscarriage    Muscle stiffness    Myalgia    Nausea    PMS (premenstrual syndrome) 09/27/2012   Stress    Swelling of lower extremity    Tachycardia    patient states she has long history of resting tachycardia   Thyroid nodule    Uterine prolapse 10/21/2014   Vaginal  irritation 10/08/2015   Vision changes    Wheezing    Yeast infection 10/08/2015   History reviewed. No pertinent surgical history. The following portions of the patient's history were reviewed and updated as appropriate: allergies, current medications, past family history, past medical history, past social history, past surgical history and problem list.   Health Maintenance:  Normal pap and negative HRHPV on 03/24/20.  Review of Systems:  Pertinent items noted in HPI and remainder of comprehensive ROS otherwise negative.  Physical Exam:   General:  Alert, oriented and cooperative.   Mental Status: Normal mood and affect perceived. Normal judgment and thought content.  Physical exam deferred due to nature of the encounter  Upstream - 12/02/20 1137       Pregnancy Intention Screening   Does the patient want to become pregnant in the next year? No    Does the patient's partner want to become pregnant in the next year? No    Would the patient like to discuss contraceptive options today? Yes      Contraception Wrap Up   Current Method Abstinence    End Method Oral Contraceptive    Contraception Counseling Provided Yes             Labs and  Imaging Results for orders placed or performed in visit on 11/23/20 (from the past 336 hour(s))  Beta hCG quant (ref lab)   Collection Time: 12/01/20 10:47 AM  Result Value Ref Range   hCG Quant <1 mIU/mL   US OB Transvaginal  Result Date: 11/07/2020 Formatting of this result is different from the original. Images from the original result were not included.  [facility] for [facility] @ [facility] [address] [place] [address] FOLLOW UP SONOGRAM Hailey Holden is in the office for a follow up sonogram for viability. She is a 35 y.o. year old G55P2002 with Estimated Date of Delivery: 06/15/21 by early ultrasound now at  [redacted]w[redacted]d weeks gestation. Thus far the pregnancy has been uncomplicated. GESTATION: SINGLETON FETAL ACTIVITY:           Heart rate         No FHT  CERVIX: Appears closed ADNEXA: The ovaries are normal. GESTATIONAL AGE AND  BIOMETRICS: Gestational criteria: Estimated Date of Delivery: 06/15/21 by early ultrasound now at [redacted]w[redacted]d Previous Scans:1 GESTATIONAL SAC           13.5 mm         6+1 weeks CROWN RUMP LENGTH           11.50 mm         7+2 weeks                                                 AVERAGE EGA(BY THIS SCAN):  7+2 weeks                                            SUSPECTED ABNORMALITIES:  yes QUALITY OF SCAN: satisfactory TECHNICIAN COMMENTS: US TV 7+2 wks,single IUP,no FHT,GS 13.5 mm=6+1 wks,normal ovaries,discussed results with Hailey Holden Clinical Impression and recommendations: I have reviewed the sonogram results above, combined with the patient's current clinical course, below are my impressions and any appropriate recommendations for management based on the sonographic findings. [name] in the first trimester G3P2002 Estimated Date of Delivery: N/A Normal general sonographic findings This recommendation follows SRU consensus guidelines: Diagnostic Criteria for Nonviable Pregnancy Early in the First Trimester. Hailey Holden Med 2013; [id number]. Hailey Holden 11/07/2020 5:10 PM A copy of this report including all images has been saved and backed up to a second source for retrieval if needed. All measures and details of the anatomical scan, placentation, fluid volume and pelvic anatomy are contained in that report. Hailey Holden 11/06/2020 10:01 AM      Assessment and Plan:     1. Miscarriage   2. Encounter for initial prescription of contraceptive pills Will rx micronor, use condoms for 1 pack Can start Sunday Meds ordered this encounter  Medications   norethindrone (MICRONOR) 0.35 MG tablet    Sig: Take 1 tablet (0.35 mg total) by mouth daily.    Dispense:  28 tablet    Refill:  11    Order Specific Question:   Supervising Provider    Answer:   Hailey Holden [[id number]]   Follow up in 8 weeks  for BP check and ROS        I discussed the assessment and treatment  plan with the patient. The patient was provided an opportunity to ask questions and all were answered. The patient agreed with the plan and demonstrated an understanding of the instructions.   The patient was advised to call back or seek an in-person evaluation/go to the ED if the symptoms worsen or if the condition fails to improve as anticipated.  I provided 10 minutes of non-face-to-face time during this encounter. I was in my office at [facility] tree for this encounter.   Cyril Mourning, NP Center for [employer], [facility]

## 2020-12-09 ENCOUNTER — Telehealth: Payer: Self-pay | Admitting: Adult Health

## 2020-12-09 MED ORDER — SULFAMETHOXAZOLE-TRIMETHOPRIM 800-160 MG PO TABS: 1.0000 | ORAL_TABLET | Freq: Two times a day (BID) | ORAL | 1 refills | Status: DC

## 2020-12-09 NOTE — Telephone Encounter (Signed)
Has hidradenitis flare will refill septra ds

## 2021-01-27 ENCOUNTER — Other Ambulatory Visit: Payer: Self-pay

## 2021-01-27 ENCOUNTER — Encounter: Payer: Self-pay | Admitting: Adult Health

## 2021-01-27 ENCOUNTER — Ambulatory Visit (INDEPENDENT_AMBULATORY_CARE_PROVIDER_SITE_OTHER): Payer: Managed Care, Other (non HMO) | Admitting: Adult Health

## 2021-01-27 VITALS — BP 130/86 | HR 113 | Ht 64.0 in | Wt 220.0 lb

## 2021-01-27 DIAGNOSIS — Z8759 Personal history of other complications of pregnancy, childbirth and the puerperium: Secondary | ICD-10-CM

## 2021-01-27 DIAGNOSIS — I10 Essential (primary) hypertension: Secondary | ICD-10-CM | POA: Diagnosis not present

## 2021-01-27 MED ORDER — AMLODIPINE BESYLATE 5 MG PO TABS: 5.0000 mg | ORAL_TABLET | Freq: Every day | ORAL | 1 refills | Status: DC

## 2021-01-27 NOTE — Progress Notes (Addendum)
  Subjective:     Patient ID: Hailey Holden, female   DOB: 25-Mar-1986, 35 y.o.   MRN: [id number]  HPI Hailey Holden is a 35 year old white female, married, G4P2, back in follow up after miscarriage and for BP check, no complaints. She is not sure if she wants another pregnancy or not, using condoms, never started [employer].  PCP is Dr Gerda Diss  Lab Results  Component Value Date   DIAGPAP  03/24/2020    - Negative for intraepithelial lesion or malignancy (NILM)   HPV NOT DETECTED 10/27/2016   HPVHIGH Negative 03/24/2020    Review of Systems Has had period since miscarriage Reviewed past medical,surgical, social and family history. Reviewed medications and allergies.     Objective:   Physical Exam BP 130/86 (BP Location: Right Arm, Patient Position: Sitting, Cuff Size: Normal)   Pulse (!) 113   Ht 5\' 4"  (1.626 m)   Wt 220 lb (99.8 kg)   LMP 01/10/2021   BMI 37.76 kg/m     Skin warm and dry. Lungs: clear to ausculation bilaterally. Cardiovascular: regular rate and rhythm.   Upstream - 01/27/21 0933       Pregnancy Intention Screening   Does the patient want to become pregnant in the next year? Unsure    Does the patient's partner want to become pregnant in the next year? Unsure    Would the patient like to discuss contraceptive options today? No      Contraception Wrap Up   Current Method Female Condom    End Method Female Condom    Contraception Counseling Provided No             Assessment:     1. Essential hypertension Continue Norvasc Meds ordered this encounter  Medications   amLODipine (NORVASC) 5 MG tablet    Sig: Take 1 tablet (5 mg total) by mouth daily.    Dispense:  90 tablet    Refill:  1    Order Specific Question:   Supervising Provider    Answer:   01/29/21, LUTHER H [[id number]]     2. History of miscarriage Take PNV    Plan:     Return 03/25/21 for physical with me

## 2021-01-29 ENCOUNTER — Encounter: Payer: Self-pay | Admitting: Nurse Practitioner

## 2021-01-29 ENCOUNTER — Other Ambulatory Visit: Payer: Self-pay | Admitting: Nurse Practitioner

## 2021-03-04 ENCOUNTER — Other Ambulatory Visit: Payer: Self-pay | Admitting: Nurse Practitioner

## 2021-03-25 ENCOUNTER — Encounter: Payer: Self-pay | Admitting: Adult Health

## 2021-03-25 ENCOUNTER — Ambulatory Visit (INDEPENDENT_AMBULATORY_CARE_PROVIDER_SITE_OTHER): Payer: Managed Care, Other (non HMO) | Admitting: Adult Health

## 2021-03-25 ENCOUNTER — Other Ambulatory Visit: Payer: Self-pay

## 2021-03-25 VITALS — BP 121/86 | HR 104 | Ht 63.5 in | Wt 225.0 lb

## 2021-03-25 DIAGNOSIS — L732 Hidradenitis suppurativa: Secondary | ICD-10-CM

## 2021-03-25 DIAGNOSIS — I10 Essential (primary) hypertension: Secondary | ICD-10-CM

## 2021-03-25 DIAGNOSIS — E063 Autoimmune thyroiditis: Secondary | ICD-10-CM

## 2021-03-25 DIAGNOSIS — Z13220 Encounter for screening for lipoid disorders: Secondary | ICD-10-CM

## 2021-03-25 DIAGNOSIS — Z131 Encounter for screening for diabetes mellitus: Secondary | ICD-10-CM

## 2021-03-25 DIAGNOSIS — Z3041 Encounter for surveillance of contraceptive pills: Secondary | ICD-10-CM | POA: Insufficient documentation

## 2021-03-25 DIAGNOSIS — Z01419 Encounter for gynecological examination (general) (routine) without abnormal findings: Secondary | ICD-10-CM | POA: Diagnosis not present

## 2021-03-25 DIAGNOSIS — E049 Nontoxic goiter, unspecified: Secondary | ICD-10-CM

## 2021-03-25 MED ORDER — AMLODIPINE BESYLATE 5 MG PO TABS: 5.0000 mg | ORAL_TABLET | Freq: Every day | ORAL | 4 refills | Status: DC

## 2021-03-25 MED ORDER — METFORMIN HCL 500 MG PO TABS: 500.0000 mg | ORAL_TABLET | Freq: Two times a day (BID) | ORAL | 3 refills | Status: DC

## 2021-03-25 MED ORDER — CLINDAMYCIN PHOSPHATE 1 % EX GEL: Freq: Two times a day (BID) | CUTANEOUS | 11 refills | Status: DC

## 2021-03-25 MED ORDER — NORETHINDRONE 0.35 MG PO TABS: 1.0000 | ORAL_TABLET | Freq: Every day | ORAL | 11 refills | Status: DC

## 2021-03-25 NOTE — Progress Notes (Signed)
Patient ID: Hailey Holden, female   DOB: 09-Jan-1986, 35 y.o.   MRN: [id number] History of Present Illness: Dewanda is a 35 year old white female, married, G3P2 012, in for a well woman gyn exam. Lab Results  Component Value Date   DIAGPAP  03/24/2020    - Negative for intraepithelial lesion or malignancy (NILM)   HPV NOT DETECTED 10/27/2016   HPVHIGH Negative 03/24/2020    PCP is Everlene Other DO.  Current Medications, Allergies, Past Medical History, Past Surgical History, Family History and Social History were reviewed in [employer] record.     Review of Systems:  Patient denies any headaches, hearing loss, fatigue, blurred vision, shortness of breath, chest pain, abdominal pain, problems with bowel movements, urination, or intercourse. No joint pain or mood swings.    Physical Exam:BP 121/86 (BP Location: Right Arm, Patient Position: Sitting, Cuff Size: Large)    Pulse (!) 104    Ht 5' 3.5" (1.613 m)    Wt 225 lb (102.1 kg)    LMP 03/08/2020    BMI 39.23 kg/m   General:  Well developed, well nourished, no acute distress Skin:  Warm and dry Neck:  Midline trachea, enlarged thyroid, good ROM, no lymphadenopathy Lungs; Clear to auscultation bilaterally Breast:  No dominant palpable mass, retraction, or nipple discharge Cardiovascular: Regular rate and rhythm Abdomen:  Soft, non tender, no hepatosplenomegaly,+HS Pelvic:  External genitalia is normal in appearance, no lesions.  The vagina is normal in appearance. Urethra has no lesions or masses. The cervix is bulbous.  Uterus is felt to be normal size, shape, and contour.  No adnexal masses or tenderness noted.Bladder is non tender, no masses felt. Rectal: Deferred  Extremities/musculoskeletal:  No swelling or varicosities noted, no clubbing or cyanosis Psych:  No mood changes, alert and cooperative,seems happy AA is 0 Fall risk is low Depression screen [facility] 2/9 03/25/2021 07/24/2020 03/24/2020  Decreased Interest  1 0 0  Down, Depressed, Hopeless 2 0 0  PHQ - 2 Score 3 0 0  Altered sleeping 1 - 1  Tired, decreased energy 2 - 1  Change in appetite 2 - 0  Feeling bad or failure about yourself  2 - 0  Trouble concentrating 1 - 1  Moving slowly or fidgety/restless 0 - 0  Suicidal thoughts 0 - 0  PHQ-9 Score 11 - 3  Difficult doing work/chores - - -   She declines meds   GAD 7 : Generalized Anxiety Score 03/25/2021 03/24/2020 11/22/2019  Nervous, Anxious, on Edge 2 0 1  Control/stop worrying 1 0 0  Worry too much - different things 1 0 0  Trouble relaxing 1 0 1  Restless 0 0 1  Easily annoyed or irritable 2 0 1  Afraid - awful might happen 1 0 0  Total GAD 7 Score 8 0 4  Anxiety Difficulty - - Somewhat difficult      Upstream - 03/25/21 0836       Pregnancy Intention Screening   Does the patient want to become pregnant in the next year? Ok Either Way    Does the patient's partner want to become pregnant in the next year? Ok Either Way    Would the patient like to discuss contraceptive options today? No      Contraception Wrap Up   Current Method Oral Contraceptive    End Method Oral Contraceptive    Contraception Counseling Provided No  Examination chaperoned by Malachy Mood LPN   Impression and Plan: 1. Encounter for well woman exam with routine gynecological exam Physical in 1 year Papin 2024 Will check labs  - CBC - Comprehensive metabolic panel  2. Enlarged thyroid   3. Hidradenitis Will try metformin and cleocin T gel Will talk in several weeks to see if better  Meds ordered this encounter  Medications   norethindrone (MICRONOR) 0.35 MG tablet    Sig: Take 1 tablet (0.35 mg total) by mouth daily.    Dispense:  28 tablet    Refill:  11    Order Specific Question:   Supervising Provider    Answer:   Duane Lope H [[id number]]   amLODipine (NORVASC) 5 MG tablet    Sig: Take 1 tablet (5 mg total) by mouth daily.    Dispense:  90 tablet    Refill:  4     Order Specific Question:   Supervising Provider    Answer:   Duane Lope H [[id number]]   metFORMIN (GLUCOPHAGE) 500 MG tablet    Sig: Take 1 tablet (500 mg total) by mouth 2 (two) times daily with a meal.    Dispense:  60 tablet    Refill:  3    Order Specific Question:   Supervising Provider    Answer:   Duane Lope H [[id number]]   clindamycin (CLINDAGEL) 1 % gel    Sig: Apply topically 2 (two) times daily.    Dispense:  30 g    Refill:  11    Order Specific Question:   Supervising Provider    Answer:   Despina Hidden, LUTHER H [[id number]]    4. Essential hypertension Will continue Norvasc 5 mg   5. Encounter for surveillance of contraceptive pills Continue micronor  6. Hashimoto's thyroiditis  - TSH - T4, free  7. Screening for diabetes mellitus  - Hemoglobin A1c  8. Screening cholesterol level  - Lipid panel

## 2021-03-26 LAB — CBC
Hematocrit: 39.7 % (ref 34.0–46.6)
Hemoglobin: 12.9 g/dL (ref 11.1–15.9)
MCH: 26.4 pg — ABNORMAL LOW (ref 26.6–33.0)
MCHC: 32.5 g/dL (ref 31.5–35.7)
MCV: 81 fL (ref 79–97)
Platelets: 445 10*3/uL (ref 150–450)
RBC: 4.88 x10E6/uL (ref 3.77–5.28)
RDW: 13.2 % (ref 11.7–15.4)
WBC: 9.9 10*3/uL (ref 3.4–10.8)

## 2021-03-26 LAB — COMPREHENSIVE METABOLIC PANEL
ALT: 26 IU/L (ref 0–32)
AST: 24 IU/L (ref 0–40)
Albumin/Globulin Ratio: 1.6 (ref 1.2–2.2)
Albumin: 4.4 g/dL (ref 3.8–4.8)
Alkaline Phosphatase: 127 IU/L — ABNORMAL HIGH (ref 44–121)
BUN/Creatinine Ratio: 11 (ref 9–23)
BUN: 7 mg/dL (ref 6–20)
Bilirubin Total: 1 mg/dL (ref 0.0–1.2)
CO2: 21 mmol/L (ref 20–29)
Calcium: 9.2 mg/dL (ref 8.7–10.2)
Chloride: 102 mmol/L (ref 96–106)
Creatinine, Ser: 0.64 mg/dL (ref 0.57–1.00)
Globulin, Total: 2.8 g/dL (ref 1.5–4.5)
Glucose: 92 mg/dL (ref 70–99)
Potassium: 4.2 mmol/L (ref 3.5–5.2)
Sodium: 140 mmol/L (ref 134–144)
Total Protein: 7.2 g/dL (ref 6.0–8.5)
eGFR: 117 mL/min/{1.73_m2} (ref >59)

## 2021-03-26 LAB — T4, FREE: Free T4: 1.36 ng/dL (ref 0.82–1.77)

## 2021-03-26 LAB — LIPID PANEL
Chol/HDL Ratio: 6.8 ratio — ABNORMAL HIGH (ref 0.0–4.4)
Cholesterol, Total: 219 mg/dL — ABNORMAL HIGH (ref 100–199)
HDL: 32 mg/dL — ABNORMAL LOW (ref >39)
LDL Chol Calc (NIH): 155 mg/dL — ABNORMAL HIGH (ref 0–99)
Triglycerides: 173 mg/dL — ABNORMAL HIGH (ref 0–149)
VLDL Cholesterol Cal: 32 mg/dL (ref 5–40)

## 2021-03-26 LAB — TSH: TSH: 1.68 u[IU]/mL (ref 0.450–4.500)

## 2021-03-26 LAB — HEMOGLOBIN A1C
Est. average glucose Bld gHb Est-mCnc: 120 mg/dL
Hgb A1c MFr Bld: 5.8 % — ABNORMAL HIGH (ref 4.8–5.6)

## 2021-05-28 ENCOUNTER — Encounter: Payer: Self-pay | Admitting: Family Medicine

## 2021-06-10 ENCOUNTER — Other Ambulatory Visit: Payer: Self-pay | Admitting: Nurse Practitioner

## 2021-07-08 ENCOUNTER — Other Ambulatory Visit: Payer: Self-pay | Admitting: Nurse Practitioner

## 2021-07-08 ENCOUNTER — Other Ambulatory Visit: Payer: Self-pay | Admitting: Adult Health

## 2021-07-08 MED ORDER — ALBUTEROL SULFATE HFA 108 (90 BASE) MCG/ACT IN AERS: INHALATION_SPRAY | RESPIRATORY_TRACT | 0 refills | Status: DC

## 2021-07-08 NOTE — Progress Notes (Signed)
Refilled inhaler at her request  ?

## 2021-07-15 NOTE — Telephone Encounter (Signed)
Call and sent my chart message to schedule appointment 07/15/21 ?

## 2021-07-23 ENCOUNTER — Other Ambulatory Visit: Payer: Self-pay | Admitting: Nurse Practitioner

## 2021-07-23 NOTE — Telephone Encounter (Signed)
Appt made for May 2 @ 3:10 with Dr Adriana Simas  ?

## 2021-07-28 ENCOUNTER — Ambulatory Visit: Payer: Managed Care, Other (non HMO) | Admitting: Family Medicine

## 2021-08-03 ENCOUNTER — Ambulatory Visit: Payer: Managed Care, Other (non HMO) | Admitting: Family Medicine

## 2021-08-06 ENCOUNTER — Other Ambulatory Visit: Payer: Self-pay | Admitting: Adult Health

## 2021-08-20 ENCOUNTER — Encounter: Payer: Self-pay | Admitting: Family Medicine

## 2021-08-20 ENCOUNTER — Ambulatory Visit: Payer: BC Managed Care – PPO | Admitting: Family Medicine

## 2021-08-20 DIAGNOSIS — I10 Essential (primary) hypertension: Secondary | ICD-10-CM

## 2021-08-20 DIAGNOSIS — E669 Obesity, unspecified: Secondary | ICD-10-CM

## 2021-08-20 DIAGNOSIS — R7303 Prediabetes: Secondary | ICD-10-CM | POA: Diagnosis not present

## 2021-08-20 DIAGNOSIS — J45909 Unspecified asthma, uncomplicated: Secondary | ICD-10-CM | POA: Diagnosis not present

## 2021-08-20 MED ORDER — BUDESONIDE-FORMOTEROL FUMARATE 160-4.5 MCG/ACT IN AERO: 2.0000 | INHALATION_SPRAY | Freq: Two times a day (BID) | RESPIRATORY_TRACT | 3 refills | Status: DC

## 2021-08-20 NOTE — Patient Instructions (Addendum)
Medication as prescribed.  Call/message with concerns.  Follow up annually or sooner if needed.  Take care  Dr. Adriana Simas

## 2021-08-23 DIAGNOSIS — R7303 Prediabetes: Secondary | ICD-10-CM | POA: Insufficient documentation

## 2021-08-23 DIAGNOSIS — E669 Obesity, unspecified: Secondary | ICD-10-CM | POA: Insufficient documentation

## 2021-08-23 NOTE — Progress Notes (Signed)
Subjective:  Patient ID: Hailey Holden, female    DOB: 1985-06-25  Age: 36 y.o. MRN: [id number]  CC: Chief Complaint  Patient presents with   Establish Care    HPI:  36 year old female presents to establish care.   Patient states that overall she is doing well at this time.  However, she has recently had more difficulty with her asthma.  She is having to use her inhaler almost daily.  She is compliant with her antihistamine and Singulair but continues to have chest tightness and shortness of breath.  No fever.  No reports of recent respiratory infection.  Hypertension is well controlled on amlodipine.  Patient with pains on metformin for prediabetes.  Stable at this time.  Patient Active Problem List   Diagnosis Date Noted   Obesity (BMI 30-39.9) 08/23/2021   Prediabetes 08/23/2021   History of miscarriage 01/27/2021   Vitamin D deficiency 05/17/2018   Hashimoto's thyroiditis 11/07/2016   Tachycardia    Essential hypertension 09/27/2012   PMS (premenstrual syndrome) 09/27/2012   Asthma 09/03/2012   Hyperlipidemia     Social Hx   Social History   Socioeconomic History   Marital status: Married    Spouse name: Tyshea Imel   Number of children: 2   Years of education: Not on file   Highest education level: Not on file  Occupational History   Occupation: LPN     Employer: [employer]    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[employer]  Tobacco Use   Smoking status: Never   Smokeless tobacco: Never  Vaping Use   Vaping Use: Never used  Substance and Sexual Activity   Alcohol use: No   Drug use: No   Sexual activity: Yes    Birth control/protection: Pill  Other Topics Concern   Not on file  Social History Narrative   Not on file   Social Determinants of Health   Financial Resource Strain: Low Risk    Difficulty of Paying Living Expenses: Not hard at all  Food Insecurity: No Food Insecurity   Worried About Programme researcher, broadcasting/film/video in the Last Year: Never true   Ran Out of  Food in the Last Year: Never true  Transportation Needs: No Transportation Needs   Lack of Transportation (Medical): No   Lack of Transportation (Non-Medical): No  Physical Activity: Inactive   Days of Exercise per Week: 0 days   Minutes of Exercise per Session: 0 min  Stress: Stress Concern Present   Feeling of Stress : Very much  Social Connections: Socially Integrated   Frequency of Communication with Friends and Family: More than three times a week   Frequency of Social Gatherings with Friends and Family: More than three times a week   Attends Religious Services: More than 4 times per year   Active Member of [employer] or Organizations: Yes   Attends Engineer, structural: More than 4 times per year   Marital Status: Married    Review of Systems Per HPI  Objective:  BP 119/87   Pulse (!) 111   Temp 98.1 F (36.7 C)   Ht 5\' 3"  (1.6 m)   Wt 222 lb 12.8 oz (101.1 kg)   SpO2 96%   BMI 39.47 kg/m      08/20/2021    9:09 AM 03/25/2021    8:32 AM 01/27/2021    9:10 AM  BP/Weight  Systolic BP 119 121 130  Diastolic BP 87 86 86  Wt. (Lbs)  222.8 225 220  BMI 39.47 kg/m2 39.23 kg/m2 37.76 kg/m2    Physical Exam Vitals and nursing note reviewed.  Constitutional:      General: She is not in acute distress.    Appearance: Normal appearance. She is obese. She is not ill-appearing.  HENT:     Head: Normocephalic and atraumatic.  Cardiovascular:     Rate and Rhythm: Normal rate and regular rhythm.  Pulmonary:     Effort: Pulmonary effort is normal.     Breath sounds: Normal breath sounds. No wheezing, rhonchi or rales.  Neurological:     Mental Status: She is alert.  Psychiatric:        Mood and Affect: Mood normal.        Behavior: Behavior normal.    Lab Results  Component Value Date   WBC 9.9 03/25/2021   HGB 12.9 03/25/2021   HCT 39.7 03/25/2021   PLT 445 03/25/2021   GLUCOSE 92 03/25/2021   CHOL 219 (H) 03/25/2021   TRIG 173 (H) 03/25/2021   HDL 32  (L) 03/25/2021   LDLCALC 155 (H) 03/25/2021   ALT 26 03/25/2021   AST 24 03/25/2021   NA 140 03/25/2021   K 4.2 03/25/2021   CL 102 03/25/2021   CREATININE 0.64 03/25/2021   BUN 7 03/25/2021   CO2 21 03/25/2021   TSH 1.680 03/25/2021   HGBA1C 5.8 (H) 03/25/2021     Assessment & Plan:   Problem List Items Addressed This Visit       Cardiovascular and Mediastinum   Essential hypertension    Stable.  Continue amlodipine.         Respiratory   Asthma (Chronic)    Uncontrolled.  Adding Symbicort.       Relevant Medications   budesonide-formoterol (SYMBICORT) 160-4.5 MCG/ACT inhaler     Other   Obesity (BMI 30-39.9)   Prediabetes    Continue metformin.        Meds ordered this encounter  Medications   budesonide-formoterol (SYMBICORT) 160-4.5 MCG/ACT inhaler    Sig: Inhale 2 puffs into the lungs 2 (two) times daily.    Dispense:  1 each    Refill:  3    Dolora Ridgely DO [facility]

## 2021-08-23 NOTE — Assessment & Plan Note (Signed)
Uncontrolled.  Adding Symbicort.

## 2021-08-23 NOTE — Assessment & Plan Note (Signed)
Continue metformin.

## 2021-08-23 NOTE — Assessment & Plan Note (Signed)
Stable. °-Continue amlodipine °

## 2021-09-03 NOTE — Telephone Encounter (Signed)
Patient had med check on 08/20/2021

## 2021-09-20 IMAGING — US US BREAST*R* LIMITED INC AXILLA
1 series · 4 of 4 positions shown · non-contrast
Comparison: None.

CLINICAL DATA: The patient presents with a palpable lump on the
right.

EXAM:
DIGITAL DIAGNOSTIC BILATERAL MAMMOGRAM WITH CAD AND TOMO
ULTRASOUND RIGHT BREAST

[Series 1: us breast*right* limited inc axilla · 0.07mm/px · 4 of 4 slices shown]
[im 1/4]
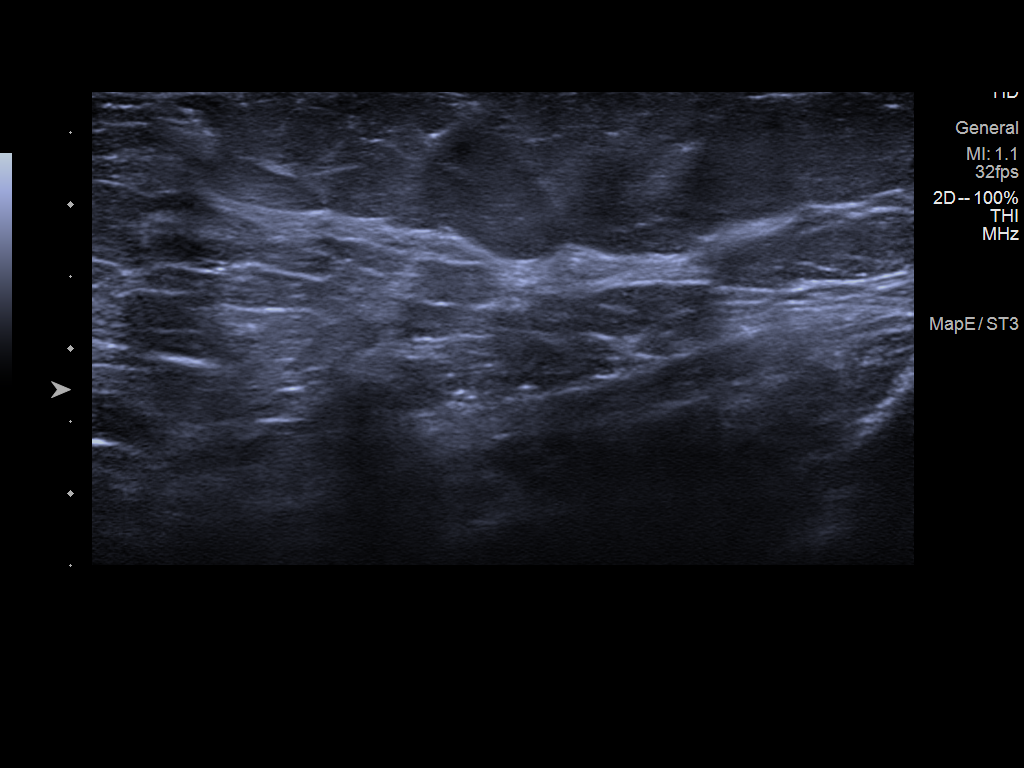
[im 2/4]
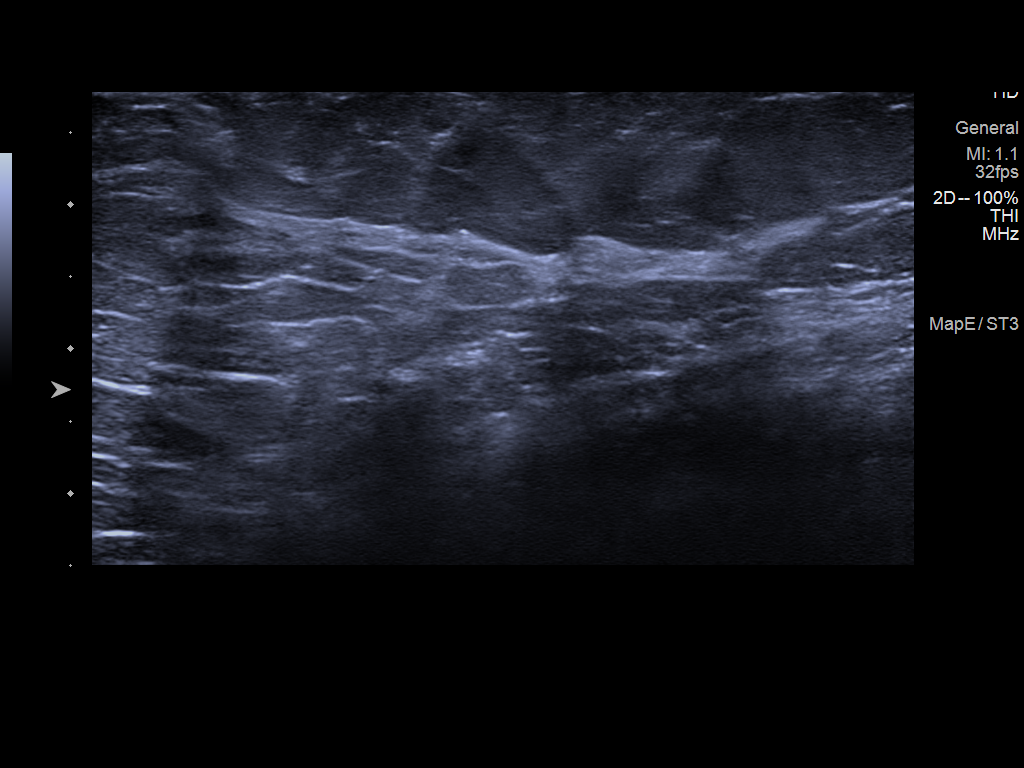
[im 3/4]
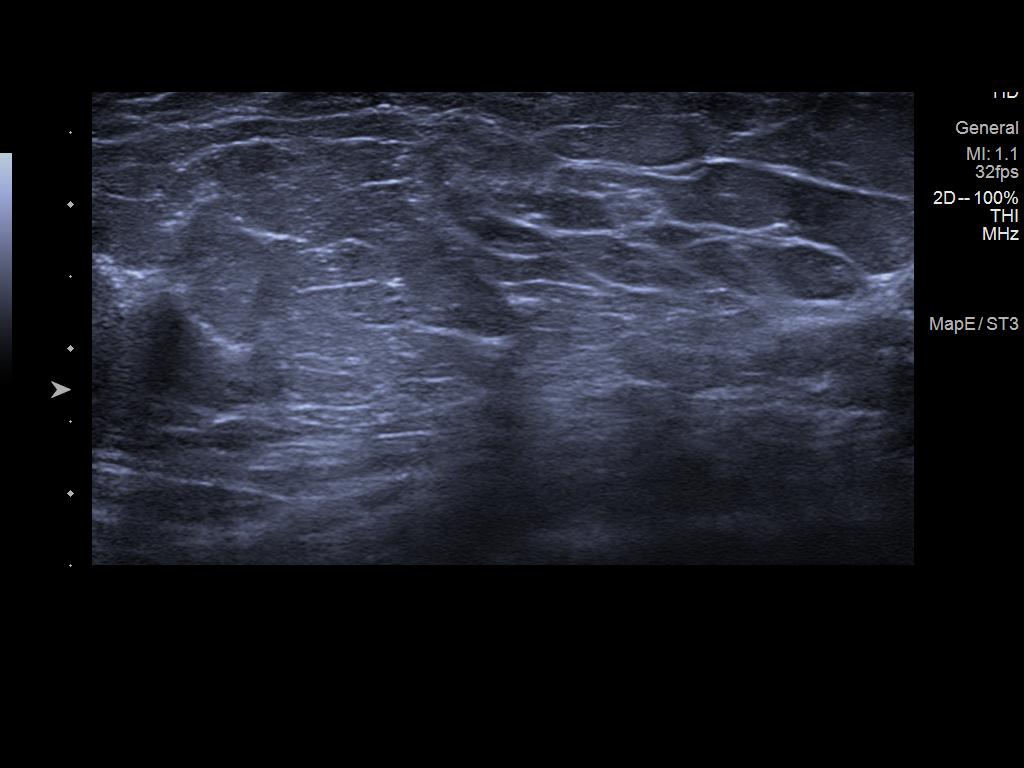
[im 4/4]
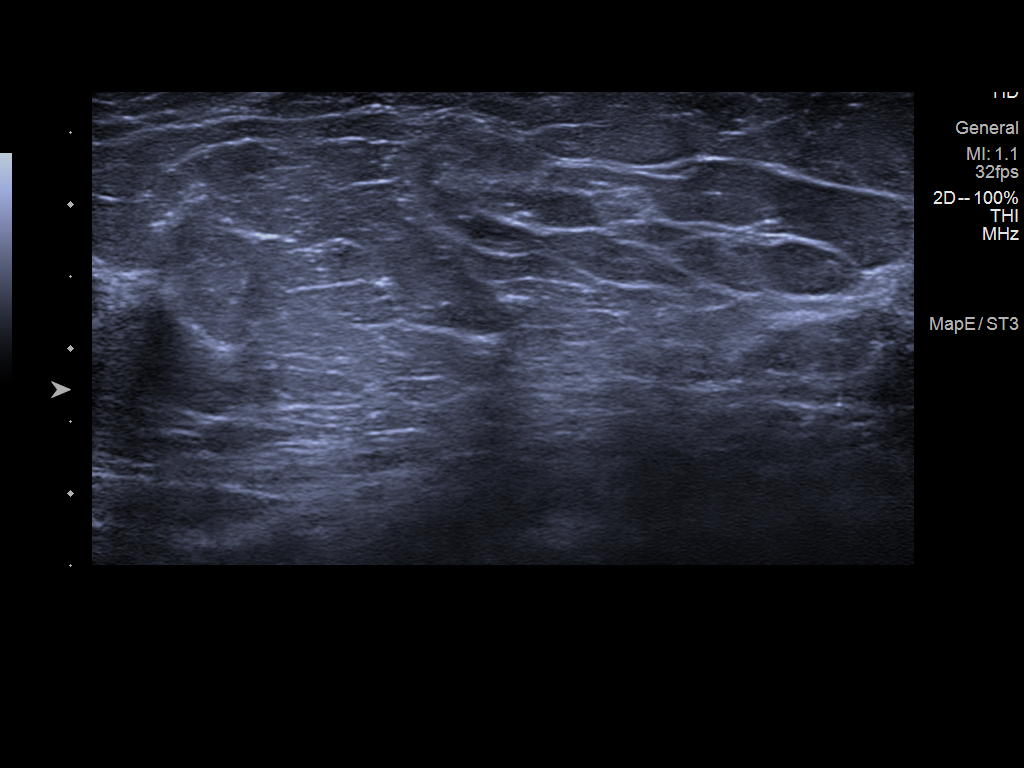

[4 of 4 positions shown; findings below may reference images not displayed]

ACR Breast Density Category b: There are scattered areas of
fibroglandular density.
FINDINGS: No suspicious masses, calcifications, or distortion are seen
mammographically.

Mammographic images were processed with CAD.

On physical exam, no suspicious lumps are identified.

Targeted ultrasound is performed, showing no sonographic
abnormalities to correlate with the patient's lump.
IMPRESSION: No mammographic or sonographic evidence of malignancy.

RECOMMENDATION:
Treatment of the patient's lump should be based on clinical and
physical exam given lack of imaging findings. Recommend annual
screening mammography beginning at the age of 40.

I have discussed the findings and recommendations with the patient.
If applicable, a reminder letter will be sent to the patient
regarding the next appointment.

BI-RADS CATEGORY  1: Negative.

## 2021-09-20 IMAGING — MG DIGITAL DIAGNOSTIC BILAT W/ TOMO W/ CAD
6 of 12 series · 6 of 36 positions shown · non-contrast
Comparison: None.

CLINICAL DATA: The patient presents with a palpable lump on the
right.

EXAM:
DIGITAL DIAGNOSTIC BILATERAL MAMMOGRAM WITH CAD AND TOMO
ULTRASOUND RIGHT BREAST

[R MLO synth-2D]
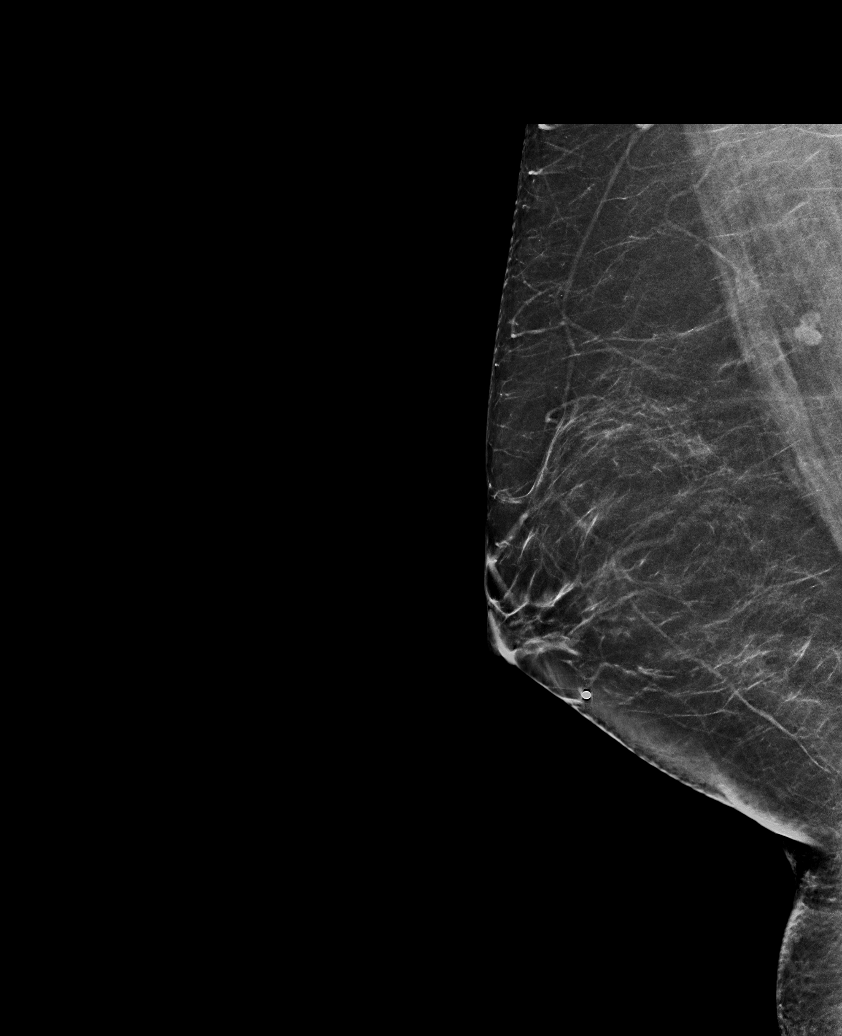

[R CC synth-2D]
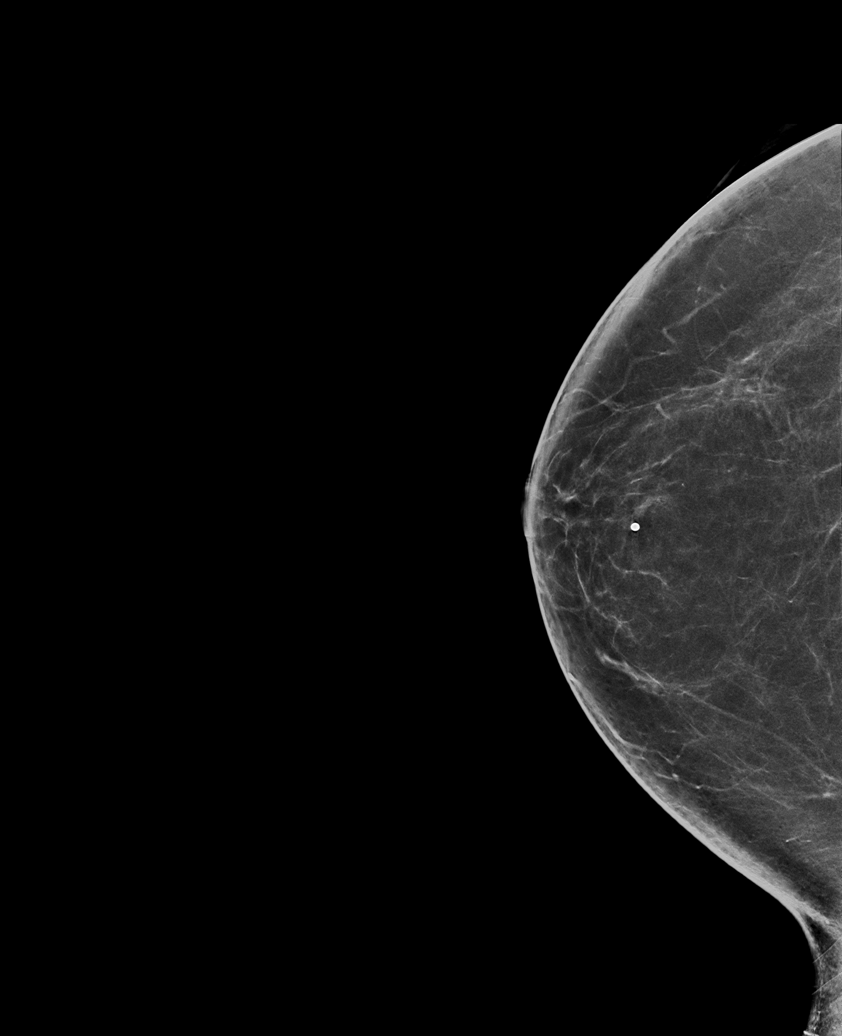

[R TAN synth-2D]
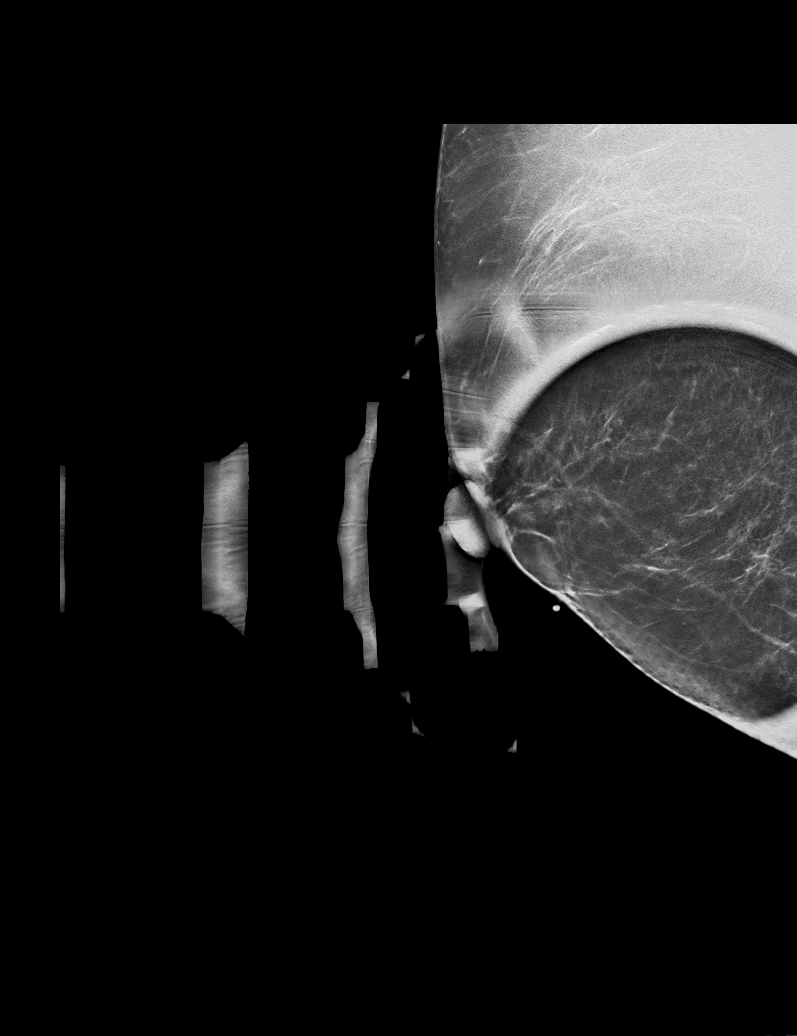

[L CC synth-2D]
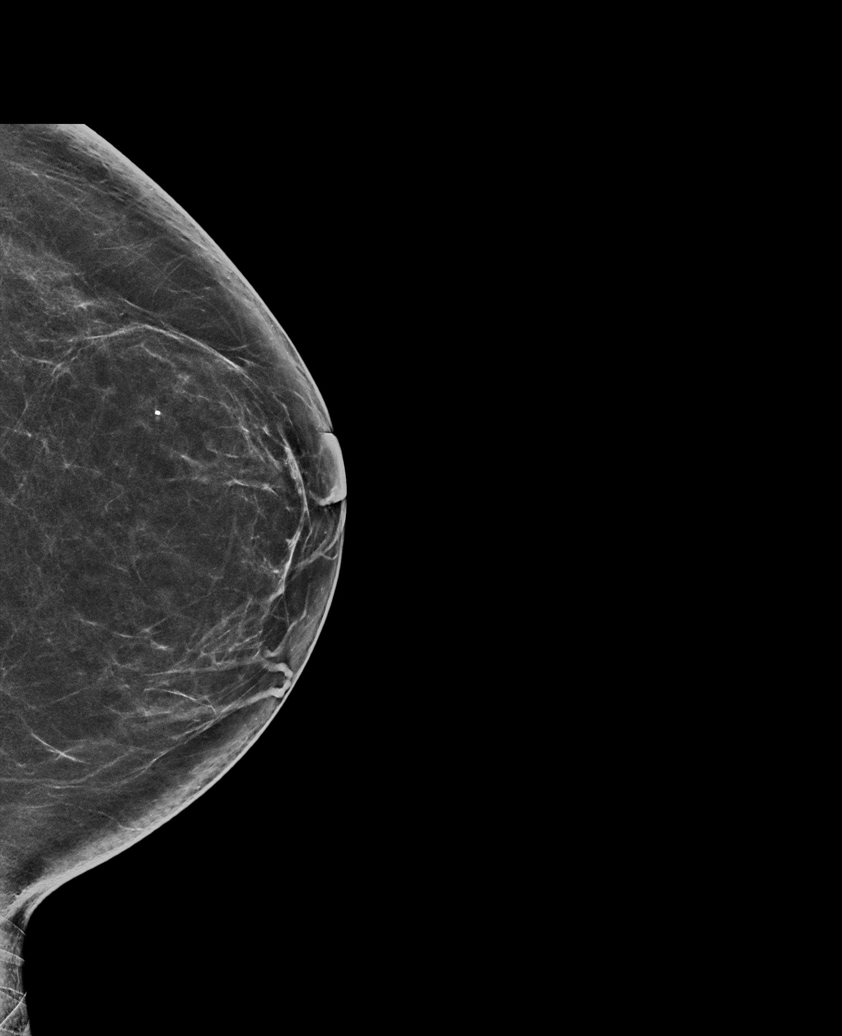

[L MLO synth-2D (1 of 2)]
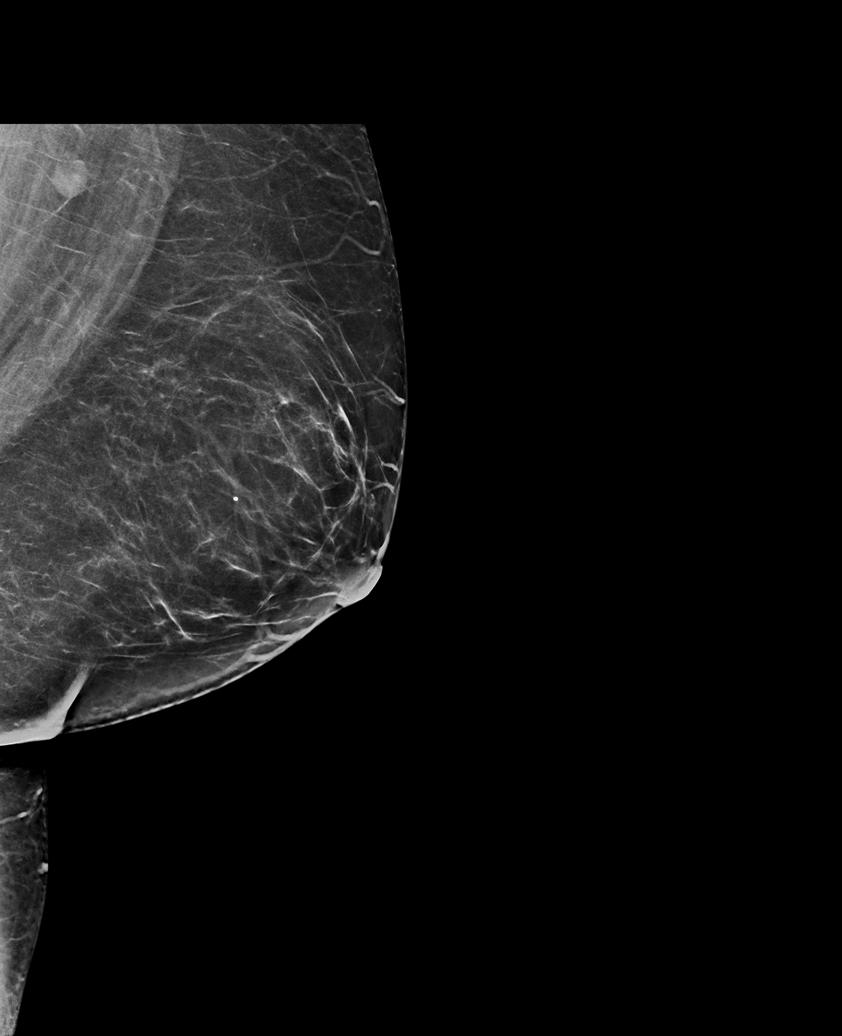

[L MLO synth-2D (2 of 2)]
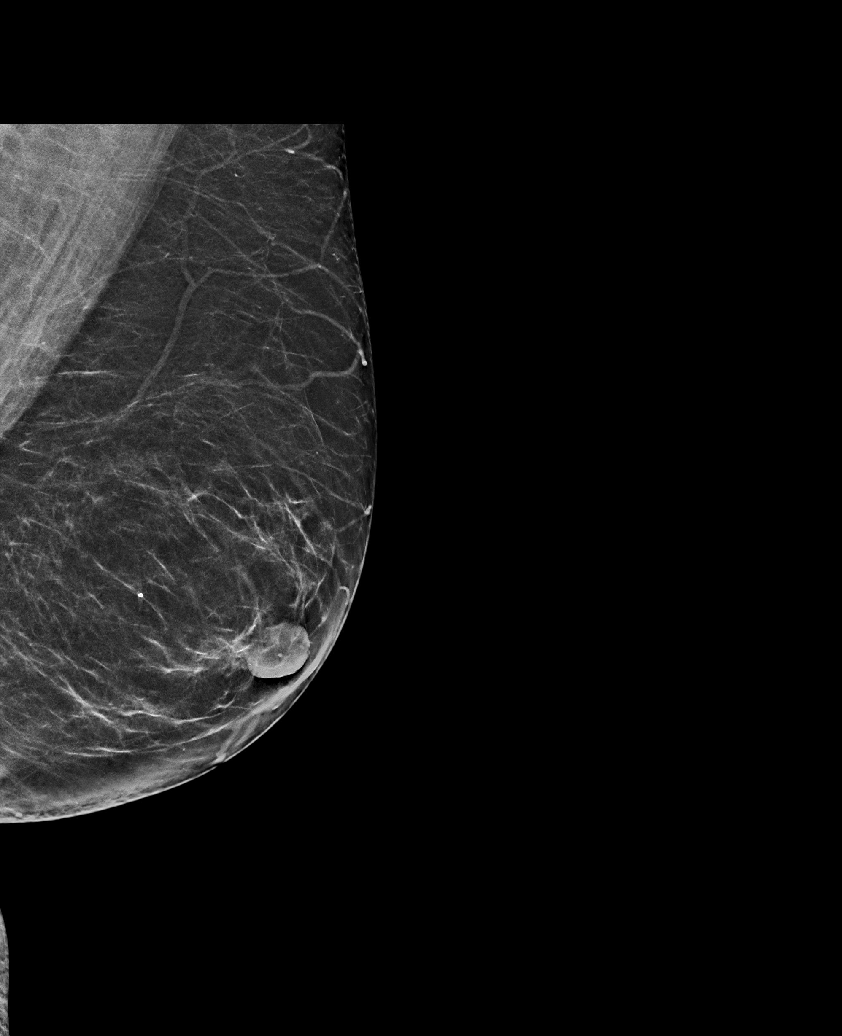

[6 of 36 positions shown; findings below may reference images not displayed]

ACR Breast Density Category b: There are scattered areas of
fibroglandular density.
FINDINGS: No suspicious masses, calcifications, or distortion are seen
mammographically.

Mammographic images were processed with CAD.

On physical exam, no suspicious lumps are identified.

Targeted ultrasound is performed, showing no sonographic
abnormalities to correlate with the patient's lump.
IMPRESSION: No mammographic or sonographic evidence of malignancy.

RECOMMENDATION:
Treatment of the patient's lump should be based on clinical and
physical exam given lack of imaging findings. Recommend annual
screening mammography beginning at the age of 40.

I have discussed the findings and recommendations with the patient.
If applicable, a reminder letter will be sent to the patient
regarding the next appointment.

BI-RADS CATEGORY  1: Negative.

## 2021-10-02 ENCOUNTER — Other Ambulatory Visit: Payer: Self-pay | Admitting: Adult Health

## 2021-11-10 ENCOUNTER — Encounter (INDEPENDENT_AMBULATORY_CARE_PROVIDER_SITE_OTHER): Payer: Self-pay

## 2022-01-19 ENCOUNTER — Other Ambulatory Visit: Payer: Self-pay | Admitting: Nurse Practitioner

## 2022-04-07 ENCOUNTER — Other Ambulatory Visit: Payer: Self-pay | Admitting: Adult Health

## 2022-04-07 DIAGNOSIS — I10 Essential (primary) hypertension: Secondary | ICD-10-CM

## 2022-04-25 ENCOUNTER — Other Ambulatory Visit: Payer: Self-pay | Admitting: Nurse Practitioner

## 2022-04-27 ENCOUNTER — Other Ambulatory Visit (HOSPITAL_COMMUNITY)
Admission: RE | Admit: 2022-04-27 | Discharge: 2022-04-27 | Disposition: A | Discharge status: Home / Self Care | Payer: BC Managed Care – PPO | Source: Ambulatory Visit | Attending: Adult Health | Admitting: Adult Health

## 2022-04-27 ENCOUNTER — Ambulatory Visit (INDEPENDENT_AMBULATORY_CARE_PROVIDER_SITE_OTHER): Payer: BC Managed Care – PPO | Admitting: Adult Health

## 2022-04-27 ENCOUNTER — Encounter: Payer: Self-pay | Admitting: Adult Health

## 2022-04-27 VITALS — BP 140/88 | HR 101 | Ht 64.0 in | Wt 221.5 lb

## 2022-04-27 DIAGNOSIS — Z01419 Encounter for gynecological examination (general) (routine) without abnormal findings: Secondary | ICD-10-CM

## 2022-04-27 DIAGNOSIS — R7309 Other abnormal glucose: Secondary | ICD-10-CM | POA: Insufficient documentation

## 2022-04-27 DIAGNOSIS — E063 Autoimmune thyroiditis: Secondary | ICD-10-CM | POA: Diagnosis not present

## 2022-04-27 DIAGNOSIS — I10 Essential (primary) hypertension: Secondary | ICD-10-CM

## 2022-04-27 DIAGNOSIS — E049 Nontoxic goiter, unspecified: Secondary | ICD-10-CM | POA: Diagnosis not present

## 2022-04-27 DIAGNOSIS — Z13220 Encounter for screening for lipoid disorders: Secondary | ICD-10-CM | POA: Diagnosis not present

## 2022-04-27 DIAGNOSIS — L732 Hidradenitis suppurativa: Secondary | ICD-10-CM

## 2022-04-27 MED ORDER — AMLODIPINE BESYLATE 5 MG PO TABS: ORAL_TABLET | ORAL | 4 refills | Status: DC

## 2022-04-27 MED ORDER — METFORMIN HCL 500 MG PO TABS: ORAL_TABLET | ORAL | 12 refills | Status: DC

## 2022-04-27 MED ORDER — SILVER SULFADIAZINE 1 % EX CREA: 1.0000 | TOPICAL_CREAM | Freq: Two times a day (BID) | CUTANEOUS | 1 refills | Status: DC

## 2022-04-27 MED ORDER — CLINDAMYCIN PHOSPHATE 1 % EX GEL: Freq: Two times a day (BID) | CUTANEOUS | 11 refills | Status: DC

## 2022-04-27 NOTE — Progress Notes (Signed)
Patient ID: Hailey Holden, female   DOB: [birth year], 37 y.o.   MRN: [id number] History of Present Illness: Hailey Holden is a 37 year old white female, married, [id number], in for a well woman gyn exam and pap. She is working from home now.  PCP is Dr Lacinda Axon.   Current Medications, Allergies, Past Medical History, Past Surgical History, Family History and Social History were reviewed in [employer] record.     Review of Systems: Patient denies any headaches, hearing loss, fatigue, blurred vision, shortness of breath, chest pain, abdominal pain, problems with bowel movements, urination, or intercourse. No joint pain or mood swings. Has had flare of HS.    Physical Exam:BP (!) 140/88 (BP Location: Left Arm, Patient Position: Sitting, Cuff Size: Normal)   Pulse (!) 101   Ht 5\' 4"  (1.626 m)   Wt 221 lb 8 oz (100.5 kg)   LMP 04/14/2022 (Exact Date)   BMI 38.02 kg/m   General:  Well developed, well nourished, no acute distress Skin:  Warm and dry Neck:  Midline trachea, normal thyroid, good ROM, no lymphadenopathy Lungs; Clear to auscultation bilaterally Breast:  No dominant palpable mass, retraction, or nipple discharge, has scarring and HS under both arms Cardiovascular: Regular rate and rhythm Abdomen:  Soft, non tender, no hepatosplenomegaly, has scarring under panniculus from HS Pelvic:  External genitalia is normal in appearance, has scarring, inner thighs, from HS.  The vagina is normal in appearance. Urethra has no lesions or masses. The cervix is bulbous. Pap with HR HPV genotyping performed.  Uterus is felt to be normal size, shape, and contour.  No adnexal masses or tenderness noted.Bladder is non tender, no masses felt. Extremities/musculoskeletal:  No swelling or varicosities noted, no clubbing or cyanosis Psych:  No mood changes, alert and cooperative,seems happy AA is 0 Fall risk is low    04/27/2022   10:32 AM 03/25/2021    8:42 AM 07/24/2020    2:53 PM   Depression screen PHQ 2/9  Decreased Interest 1 1 0  Down, Depressed, Hopeless 0 2 0  PHQ - 2 Score 1 3 0  Altered sleeping 1 1   Tired, decreased energy 3 2   Change in appetite 1 2   Feeling bad or failure about yourself  0 2   Trouble concentrating 2 1   Moving slowly or fidgety/restless 0 0   Suicidal thoughts 0 0   PHQ-9 Score 8 11        04/27/2022   10:33 AM 03/25/2021    8:42 AM 03/24/2020    9:44 AM 11/22/2019    4:00 PM  GAD 7 : Generalized Anxiety Score  Nervous, Anxious, on Edge 1 2 0 1  Control/stop worrying 0 1 0 0  Worry too much - different things 0 1 0 0  Trouble relaxing 0 1 0 1  Restless 0 0 0 1  Easily annoyed or irritable 2 2 0 1  Afraid - awful might happen 0 1 0 0  Total GAD 7 Score 3 8 0 4  Anxiety Difficulty    Somewhat difficult      Upstream - 04/27/22 1040       Pregnancy Intention Screening   Does the patient want to become pregnant in the next year? No    Does the patient's partner want to become pregnant in the next year? No    Would the patient like to discuss contraceptive options today? No  Contraception Wrap Up   Current Method Female Condom    End Method Female Condom            Examination chaperoned by Levy Pupa LPN   Impression and Plan: 1. Encounter for gynecological examination with Papanicolaou smear of cervix Pap sent Pap in 3 years if normal Physical in 1 year  - Cytology - PAP( [place]) - CBC - Comprehensive metabolic panel  2. Essential hypertension Refilled norvasc Keep check on BP at home  - amLODipine (NORVASC) 5 MG tablet; TAKE ONE TABLET (5MG  TOTAL) BY MOUTH DAILY  Dispense: 90 tablet; Refill: 4 - Comprehensive metabolic panel  3. Enlarged thyroid  - TSH - T4, free - Thyroid peroxidase antibody  4. Hidradenitis Has flares Has metformin to decrease inflammation will increase to tid, and has clindagel and silvadene  Meds ordered this encounter  Medications   clindamycin (CLINDAGEL) 1 %  gel    Sig: Apply topically 2 (two) times daily.    Dispense:  30 g    Refill:  11    Order Specific Question:   Supervising Provider    Answer:   Tania Ade H [[id number]]   metFORMIN (GLUCOPHAGE) 500 MG tablet    Sig: Take 1 tid    Dispense:  90 tablet    Refill:  12    Order Specific Question:   Supervising Provider    Answer:   Tania Ade H [[id number]]   silver sulfADIAZINE (SILVADENE) 1 % cream    Sig: Apply 1 Application topically 2 (two) times daily.    Dispense:  50 g    Refill:  1    Order Specific Question:   Supervising Provider    Answer:   Elonda Husky, LUTHER H [[id number]]   amLODipine (NORVASC) 5 MG tablet    Sig: TAKE ONE TABLET (5MG  TOTAL) BY MOUTH DAILY    Dispense:  90 tablet    Refill:  4    Order Specific Question:   Supervising Provider    Answer:   Elonda Husky, LUTHER H [[id number]]     5. Hashimoto's thyroiditis On synthroid 25 mcg daily - TSH - T4, free - Thyroid peroxidase antibody  6. Screening cholesterol level  - Lipid panel  7. Elevated hemoglobin A1c - Hemoglobin A1c

## 2022-04-28 ENCOUNTER — Other Ambulatory Visit: Payer: Self-pay | Admitting: Adult Health

## 2022-04-28 ENCOUNTER — Other Ambulatory Visit: Payer: Self-pay | Admitting: Family Medicine

## 2022-04-28 LAB — LIPID PANEL
Chol/HDL Ratio: 6.6 ratio — ABNORMAL HIGH (ref 0.0–4.4)
Cholesterol, Total: 244 mg/dL — ABNORMAL HIGH (ref 100–199)
HDL: 37 mg/dL — ABNORMAL LOW (ref >39)
LDL Chol Calc (NIH): 177 mg/dL — ABNORMAL HIGH (ref 0–99)
Triglycerides: 159 mg/dL — ABNORMAL HIGH (ref 0–149)
VLDL Cholesterol Cal: 30 mg/dL (ref 5–40)

## 2022-04-28 LAB — COMPREHENSIVE METABOLIC PANEL
ALT: 30 IU/L (ref 0–32)
AST: 25 IU/L (ref 0–40)
Albumin/Globulin Ratio: 1.5 (ref 1.2–2.2)
Albumin: 4.3 g/dL (ref 3.9–4.9)
Alkaline Phosphatase: 113 IU/L (ref 44–121)
BUN/Creatinine Ratio: 9 (ref 9–23)
BUN: 5 mg/dL — ABNORMAL LOW (ref 6–20)
Bilirubin Total: 1 mg/dL (ref 0.0–1.2)
CO2: 20 mmol/L (ref 20–29)
Calcium: 9.3 mg/dL (ref 8.7–10.2)
Chloride: 103 mmol/L (ref 96–106)
Creatinine, Ser: 0.56 mg/dL — ABNORMAL LOW (ref 0.57–1.00)
Globulin, Total: 2.8 g/dL (ref 1.5–4.5)
Glucose: 86 mg/dL (ref 70–99)
Potassium: 4.1 mmol/L (ref 3.5–5.2)
Sodium: 139 mmol/L (ref 134–144)
Total Protein: 7.1 g/dL (ref 6.0–8.5)
eGFR: 120 mL/min/{1.73_m2} (ref >59)

## 2022-04-28 LAB — CBC
Hematocrit: 41.2 % (ref 34.0–46.6)
Hemoglobin: 13.3 g/dL (ref 11.1–15.9)
MCH: 26.8 pg (ref 26.6–33.0)
MCHC: 32.3 g/dL (ref 31.5–35.7)
MCV: 83 fL (ref 79–97)
Platelets: 409 10*3/uL (ref 150–450)
RBC: 4.96 x10E6/uL (ref 3.77–5.28)
RDW: 13.8 % (ref 11.7–15.4)
WBC: 10.7 10*3/uL (ref 3.4–10.8)

## 2022-04-28 LAB — HEMOGLOBIN A1C
Est. average glucose Bld gHb Est-mCnc: 120 mg/dL
Hgb A1c MFr Bld: 5.8 % — ABNORMAL HIGH (ref 4.8–5.6)

## 2022-04-28 LAB — TSH: TSH: 1.88 u[IU]/mL (ref 0.450–4.500)

## 2022-04-28 LAB — THYROID PEROXIDASE ANTIBODY: Thyroperoxidase Ab SerPl-aCnc: 45 IU/mL — ABNORMAL HIGH (ref 0–34)

## 2022-04-28 LAB — T4, FREE: Free T4: 1.46 ng/dL (ref 0.82–1.77)

## 2022-04-28 MED ORDER — ROSUVASTATIN CALCIUM 10 MG PO TABS: 10.0000 mg | ORAL_TABLET | Freq: Every day | ORAL | 3 refills | Status: DC

## 2022-04-28 MED ORDER — MONTELUKAST SODIUM 10 MG PO TABS: ORAL_TABLET | ORAL | 0 refills | Status: DC

## 2022-05-02 MED ORDER — LEVOTHYROXINE SODIUM 25 MCG PO TABS: ORAL_TABLET | ORAL | 0 refills | Status: DC

## 2022-05-03 LAB — CYTOLOGY - PAP
Comment: NEGATIVE
Diagnosis: NEGATIVE
High risk HPV: NEGATIVE

## 2022-06-20 ENCOUNTER — Other Ambulatory Visit: Payer: Self-pay | Admitting: Family Medicine

## 2022-06-20 ENCOUNTER — Encounter: Payer: Self-pay | Admitting: Family Medicine

## 2022-06-20 MED ORDER — MONTELUKAST SODIUM 10 MG PO TABS: ORAL_TABLET | ORAL | 3 refills | Status: DC

## 2022-07-12 ENCOUNTER — Other Ambulatory Visit: Payer: Self-pay | Admitting: Nurse Practitioner

## 2022-08-11 ENCOUNTER — Other Ambulatory Visit: Payer: Self-pay | Admitting: Family Medicine

## 2022-08-12 ENCOUNTER — Other Ambulatory Visit: Payer: Self-pay | Admitting: Adult Health

## 2022-08-17 ENCOUNTER — Other Ambulatory Visit: Payer: Self-pay | Admitting: Family Medicine

## 2022-08-24 ENCOUNTER — Ambulatory Visit: Payer: BC Managed Care – PPO | Admitting: Family Medicine

## 2022-08-24 VITALS — BP 122/82 | HR 132 | Temp 98.8°F | Ht 64.0 in | Wt 220.0 lb

## 2022-08-24 DIAGNOSIS — I10 Essential (primary) hypertension: Secondary | ICD-10-CM | POA: Diagnosis not present

## 2022-08-24 DIAGNOSIS — J45909 Unspecified asthma, uncomplicated: Secondary | ICD-10-CM | POA: Diagnosis not present

## 2022-08-24 DIAGNOSIS — E785 Hyperlipidemia, unspecified: Secondary | ICD-10-CM | POA: Diagnosis not present

## 2022-08-24 DIAGNOSIS — E063 Autoimmune thyroiditis: Secondary | ICD-10-CM

## 2022-08-24 MED ORDER — LEVOCETIRIZINE DIHYDROCHLORIDE 5 MG PO TABS: 5.0000 mg | ORAL_TABLET | Freq: Every evening | ORAL | 3 refills | Status: DC

## 2022-08-24 MED ORDER — LEVOTHYROXINE SODIUM 25 MCG PO TABS: ORAL_TABLET | ORAL | 3 refills | Status: DC

## 2022-08-24 NOTE — Assessment & Plan Note (Signed)
Stopping Zyrtec.  Starting Xyzal.  Continue Symbicort and Singulair.

## 2022-08-24 NOTE — Assessment & Plan Note (Signed)
Blood pressure is well-controlled.  Continue amlodipine. 

## 2022-08-24 NOTE — Patient Instructions (Signed)
Medications as prescribed.   Follow up annually.  Message me if you need anything.  Dr. Adriana Simas

## 2022-08-24 NOTE — Assessment & Plan Note (Signed)
Synthroid has been refilled.

## 2022-08-24 NOTE — Assessment & Plan Note (Signed)
We discussed lifestyle changes, particularly dietary changes.

## 2022-08-24 NOTE — Progress Notes (Signed)
Subjective:  Patient ID: Hailey Holden, female    DOB: 1986-03-25  Age: 37 y.o. MRN: [id number]  CC: Chief Complaint  Patient presents with   Hypothyroidism    HPI:  37 year old female presents for evaluation of the above.  Patient needs refill on her Synthroid.  Apparently there was an issue at the pharmacy.  I did refill this a couple days ago.  She is not having symptoms.  She is doing overall well at this time.  Patient reports that she has recently had issues with allergies and asthma.  She is curious about switching her antihistamine.  Patient states that she has not started Crestor.  She is more interested in lifestyle changes as opposed to pharmacotherapy.  Patient Active Problem List   Diagnosis Date Noted   Obesity (BMI 30-39.9) 08/23/2021   Prediabetes 08/23/2021   Vitamin D deficiency 05/17/2018   Hashimoto's thyroiditis 11/07/2016   Tachycardia    Hidradenitis 10/17/2013   Essential hypertension 09/27/2012   PMS (premenstrual syndrome) 09/27/2012   Asthma 09/03/2012   Dyslipidemia     Social Hx   Social History   Socioeconomic History   Marital status: Married    Spouse name: Tonea Finton   Number of children: 2   Years of education: Not on file   Highest education level: Not on file  Occupational History   Occupation: LPN     Employer: [employer]    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[employer]  Tobacco Use   Smoking status: Never   Smokeless tobacco: Never  Vaping Use   Vaping Use: Never used  Substance and Sexual Activity   Alcohol use: No   Drug use: No   Sexual activity: Yes    Birth control/protection: None, Condom  Other Topics Concern   Not on file  Social History Narrative   Not on file   Social Determinants of Health   Financial Resource Strain: Low Risk  (04/27/2022)   Overall Financial Resource Strain (CARDIA)    Difficulty of Paying Living Expenses: Not hard at all  Food Insecurity: No Food Insecurity (04/27/2022)   Hunger Vital Sign     Worried About Running Out of Food in the Last Year: Never true    Ran Out of Food in the Last Year: Never true  Transportation Needs: No Transportation Needs (04/27/2022)   PRAPARE - Administrator, Civil Service (Medical): No    Lack of Transportation (Non-Medical): No  Physical Activity: Inactive (04/27/2022)   Exercise Vital Sign    Days of Exercise per Week: 0 days    Minutes of Exercise per Session: 0 min  Stress: Stress Concern Present (04/27/2022)   [employer] of Occupational Health - Occupational Stress Questionnaire    Feeling of Stress : Rather much  Social Connections: Socially Integrated (04/27/2022)   Social Connection and Isolation Panel [NHANES]    Frequency of Communication with Friends and Family: More than three times a week    Frequency of Social Gatherings with Friends and Family: More than three times a week    Attends Religious Services: More than 4 times per year    Active Member of Clubs or Organizations: Yes    Attends Engineer, structural: More than 4 times per year    Marital Status: Married    Review of Systems Per HPI  Objective:  BP 122/82   Pulse (!) 132   Temp 98.8 F (37.1 C)   Ht 5\' 4"  (1.626 m)  Wt 220 lb (99.8 kg)   SpO2 97%   BMI 37.76 kg/m      08/24/2022    1:06 PM 04/27/2022   11:17 AM 04/27/2022   10:35 AM  BP/Weight  Systolic BP 122 140 137  Diastolic BP 82 88 90  Wt. (Lbs) 220  221.5  BMI 37.76 kg/m2  38.02 kg/m2    Physical Exam Vitals and nursing note reviewed.  Constitutional:      General: She is not in acute distress.    Appearance: Normal appearance.  HENT:     Head: Normocephalic and atraumatic.  Eyes:     General:        Right eye: No discharge.        Left eye: No discharge.     Conjunctiva/sclera: Conjunctivae normal.  Cardiovascular:     Rate and Rhythm: Regular rhythm. Tachycardia present.  Pulmonary:     Effort: Pulmonary effort is normal.     Breath sounds: Normal breath  sounds. No wheezing, rhonchi or rales.  Neurological:     Mental Status: She is alert.  Psychiatric:        Mood and Affect: Mood normal.        Behavior: Behavior normal.     Lab Results  Component Value Date   WBC 10.7 04/27/2022   HGB 13.3 04/27/2022   HCT 41.2 04/27/2022   PLT 409 04/27/2022   GLUCOSE 86 04/27/2022   CHOL 244 (H) 04/27/2022   TRIG 159 (H) 04/27/2022   HDL 37 (L) 04/27/2022   LDLCALC 177 (H) 04/27/2022   ALT 30 04/27/2022   AST 25 04/27/2022   NA 139 04/27/2022   K 4.1 04/27/2022   CL 103 04/27/2022   CREATININE 0.56 (L) 04/27/2022   BUN 5 (L) 04/27/2022   CO2 20 04/27/2022   TSH 1.880 04/27/2022   HGBA1C 5.8 (H) 04/27/2022     Assessment & Plan:   Problem List Items Addressed This Visit       Cardiovascular and Mediastinum   Essential hypertension    Blood pressure is well-controlled.  Continue amlodipine.        Respiratory   Asthma (Chronic)    Stopping Zyrtec.  Starting Xyzal.  Continue Symbicort and Singulair.        Endocrine   Hashimoto's thyroiditis - Primary    Synthroid has been refilled.      Relevant Medications   levothyroxine (SYNTHROID) 25 MCG tablet     Other   Dyslipidemia    We discussed lifestyle changes, particularly dietary changes.       Meds ordered this encounter  Medications   levothyroxine (SYNTHROID) 25 MCG tablet    Sig: TAKE ONE (1) TABLET BY MOUTH EVERY DAY BEFORE BREAKFAST    Dispense:  90 tablet    Refill:  3   levocetirizine (XYZAL) 5 MG tablet    Sig: Take 1 tablet (5 mg total) by mouth every evening.    Dispense:  90 tablet    Refill:  3    Follow-up: Annually  Everlene Other DO [facility]

## 2023-01-14 ENCOUNTER — Other Ambulatory Visit: Payer: Self-pay | Admitting: Adult Health

## 2023-06-07 ENCOUNTER — Other Ambulatory Visit: Payer: Self-pay | Admitting: Adult Health

## 2023-06-07 DIAGNOSIS — I10 Essential (primary) hypertension: Secondary | ICD-10-CM

## 2023-06-07 MED ORDER — AMLODIPINE BESYLATE 5 MG PO TABS: ORAL_TABLET | ORAL | 4 refills | Status: DC

## 2023-06-07 NOTE — Progress Notes (Signed)
 Refilled norvasc at her request

## 2023-06-15 ENCOUNTER — Encounter: Payer: Self-pay | Admitting: Adult Health

## 2023-06-15 ENCOUNTER — Ambulatory Visit: Payer: BC Managed Care – PPO | Admitting: Adult Health

## 2023-06-15 VITALS — BP 139/96 | HR 109 | Ht 64.0 in | Wt 228.0 lb

## 2023-06-15 DIAGNOSIS — E785 Hyperlipidemia, unspecified: Secondary | ICD-10-CM | POA: Diagnosis not present

## 2023-06-15 DIAGNOSIS — R7309 Other abnormal glucose: Secondary | ICD-10-CM | POA: Diagnosis not present

## 2023-06-15 DIAGNOSIS — E063 Autoimmune thyroiditis: Secondary | ICD-10-CM | POA: Diagnosis not present

## 2023-06-15 DIAGNOSIS — E559 Vitamin D deficiency, unspecified: Secondary | ICD-10-CM | POA: Diagnosis not present

## 2023-06-15 DIAGNOSIS — I10 Essential (primary) hypertension: Secondary | ICD-10-CM

## 2023-06-15 DIAGNOSIS — L732 Hidradenitis suppurativa: Secondary | ICD-10-CM | POA: Diagnosis not present

## 2023-06-15 DIAGNOSIS — Z1331 Encounter for screening for depression: Secondary | ICD-10-CM

## 2023-06-15 DIAGNOSIS — Z01419 Encounter for gynecological examination (general) (routine) without abnormal findings: Secondary | ICD-10-CM | POA: Insufficient documentation

## 2023-06-15 NOTE — Progress Notes (Signed)
 Patient ID: Hailey Holden, female   DOB: October 15, 1985, 38 y.o.   MRN: [id number] History of Present Illness: Hailey Holden is a 38 year old white female, married, [id number] in for a well woman gyn exam.     Component Value Date/Time   DIAGPAP  04/27/2022 1049    - Negative for intraepithelial lesion or malignancy (NILM)   DIAGPAP  03/24/2020 1003    - Negative for intraepithelial lesion or malignancy (NILM)   DIAGPAP  10/27/2016 0000    NEGATIVE FOR INTRAEPITHELIAL LESIONS OR MALIGNANCY.   HPVHIGH Negative 04/27/2022 1049   HPVHIGH Negative 03/24/2020 1003   ADEQPAP  04/27/2022 1049    Satisfactory for evaluation; transformation zone component PRESENT.   ADEQPAP  03/24/2020 1003    Satisfactory for evaluation; transformation zone component PRESENT.   ADEQPAP  10/27/2016 0000    Satisfactory for evaluation  endocervical/transformation zone component PRESENT.   PCP is Dr Adriana Simas  Current Medications, Allergies, Past Medical History, Past Surgical History, Family History and Social History were reviewed in [employer] electronic medical record.     Review of Systems: Patient denies any  daily headaches, hearing loss, fatigue, blurred vision, shortness of breath, chest pain, abdominal pain, problems with urination, or intercourse. No joint pain or mood swings.  Has constipation at times    Physical Exam:BP (!) 139/96 (BP Location: Left Arm, Patient Position: Sitting, Cuff Size: Large)   Pulse (!) 109   Ht 5\' 4"  (1.626 m)   Wt 228 lb (103.4 kg)   LMP 05/21/2023 (Exact Date)   BMI 39.14 kg/m   General:  Well developed, well nourished, no acute distress Skin:  Warm and dry Neck:  Midline trachea, enlarged thyroid, good ROM, no lymphadenopathy Lungs; Clear to auscultation bilaterally Breast:  No dominant palpable mass, retraction, or nipple discharge Cardiovascular: Regular rate and rhythm Abdomen:  Soft, non tender, no hepatosplenomegaly Pelvic:  External genitalia is normal in  appearance, no lesions.scarring on inner thighs from HS.  The vagina is normal in appearance. Urethra has no lesions or masses. The cervix is bulbous.  Uterus is felt to be normal size, shape, and contour.  No adnexal masses or tenderness noted.Bladder is non tender, no masses felt. Extremities/musculoskeletal:  No swelling or varicosities noted, no clubbing or cyanosis Psych:  No mood changes, alert and cooperative,seems happy AA is 0 Fall risk is low    06/15/2023   11:40 AM 08/24/2022    1:16 PM 04/27/2022   10:32 AM  Depression screen PHQ 2/9  Decreased Interest 0 0 1  Down, Depressed, Hopeless 1 0 0  PHQ - 2 Score 1 0 1  Altered sleeping 3 1 1   Tired, decreased energy 3 1 3   Change in appetite 3 1 1   Feeling bad or failure about yourself  1 0 0  Trouble concentrating 2 0 2  Moving slowly or fidgety/restless 2 0 0  Suicidal thoughts 0 0 0  PHQ-9 Score 15 3 8   Difficult doing work/chores  Not difficult at all    Declines meds     06/15/2023   11:40 AM 08/24/2022    1:17 PM 04/27/2022   10:33 AM 03/25/2021    8:42 AM  GAD 7 : Generalized Anxiety Score  Nervous, Anxious, on Edge 0 0 1 2  Control/stop worrying 0 0 0 1  Worry too much - different things 0 0 0 1  Trouble relaxing 1 0 0 1  Restless 1 0 0 0  Easily annoyed  or irritable 1 0 2 2  Afraid - awful might happen 0 0 0 1  Total GAD 7 Score 3 0 3 8  Anxiety Difficulty  Not difficult at all        Upstream - 06/15/23 1154       Pregnancy Intention Screening   Does the patient want to become pregnant in the next year? No    Does the patient's partner want to become pregnant in the next year? No    Would the patient like to discuss contraceptive options today? No      Contraception Wrap Up   Current Method Withdrawal or Other Method    End Method Withdrawal or Other Method            Examination chaperoned by Malachy Mood LPN  Impression and Plan:  1. Encounter for well woman exam with routine gynecological  exam (Primary) Pap in 2027 Physical in 1 year Will check labs  - CBC - Comprehensive metabolic panel - Lipid panel  2. Essential hypertension On Norvasc  5 mg daily has refills, missed today Take meds, follow up  - Comprehensive metabolic panel  3. Hypothyroidism due to Hashimoto thyroiditis On synthroid 25 mcg 1 daily Check labs  - TSH + free T4 - Thyroid peroxidase antibody  4. Hidradenitis Has scarring   5. Elevated hemoglobin A1c  - Hemoglobin A1c  6. Dyslipidemia - Lipid panel  7. Vitamin D deficiency - VITAMIN D 25 Hydroxy (Vit-D Deficiency, Fractures)

## 2023-06-16 LAB — COMPREHENSIVE METABOLIC PANEL
ALT: 22 IU/L (ref 0–32)
AST: 20 IU/L (ref 0–40)
Albumin: 4.3 g/dL (ref 3.9–4.9)
Alkaline Phosphatase: 131 IU/L — ABNORMAL HIGH (ref 44–121)
BUN/Creatinine Ratio: 8 — ABNORMAL LOW (ref 9–23)
BUN: 5 mg/dL — ABNORMAL LOW (ref 6–20)
Bilirubin Total: 0.9 mg/dL (ref 0.0–1.2)
CO2: 21 mmol/L (ref 20–29)
Calcium: 9.2 mg/dL (ref 8.7–10.2)
Chloride: 103 mmol/L (ref 96–106)
Creatinine, Ser: 0.59 mg/dL (ref 0.57–1.00)
Globulin, Total: 2.8 g/dL (ref 1.5–4.5)
Glucose: 86 mg/dL (ref 70–99)
Potassium: 4.4 mmol/L (ref 3.5–5.2)
Sodium: 138 mmol/L (ref 134–144)
Total Protein: 7.1 g/dL (ref 6.0–8.5)
eGFR: 118 mL/min/{1.73_m2} (ref >59)

## 2023-06-16 LAB — VITAMIN D 25 HYDROXY (VIT D DEFICIENCY, FRACTURES): Vit D, 25-Hydroxy: 52.2 ng/mL (ref 30.0–100.0)

## 2023-06-16 LAB — CBC
Hematocrit: 40.2 % (ref 34.0–46.6)
Hemoglobin: 12.9 g/dL (ref 11.1–15.9)
MCH: 26.9 pg (ref 26.6–33.0)
MCHC: 32.1 g/dL (ref 31.5–35.7)
MCV: 84 fL (ref 79–97)
Platelets: 440 10*3/uL (ref 150–450)
RBC: 4.8 x10E6/uL (ref 3.77–5.28)
RDW: 13.2 % (ref 11.7–15.4)
WBC: 10.4 10*3/uL (ref 3.4–10.8)

## 2023-06-16 LAB — TSH+FREE T4
Free T4: 1.2 ng/dL (ref 0.82–1.77)
TSH: 2.04 u[IU]/mL (ref 0.450–4.500)

## 2023-06-16 LAB — THYROID PEROXIDASE ANTIBODY: Thyroperoxidase Ab SerPl-aCnc: 44 [IU]/mL — ABNORMAL HIGH (ref 0–34)

## 2023-06-16 LAB — HEMOGLOBIN A1C
Est. average glucose Bld gHb Est-mCnc: 117 mg/dL
Hgb A1c MFr Bld: 5.7 % — ABNORMAL HIGH (ref 4.8–5.6)

## 2023-06-16 LAB — LIPID PANEL
Chol/HDL Ratio: 6.1 ratio — ABNORMAL HIGH (ref 0.0–4.4)
Cholesterol, Total: 227 mg/dL — ABNORMAL HIGH (ref 100–199)
HDL: 37 mg/dL — ABNORMAL LOW (ref >39)
LDL Chol Calc (NIH): 166 mg/dL — ABNORMAL HIGH (ref 0–99)
Triglycerides: 133 mg/dL (ref 0–149)
VLDL Cholesterol Cal: 24 mg/dL (ref 5–40)

## 2023-08-03 ENCOUNTER — Telehealth: Payer: Self-pay | Admitting: Adult Health

## 2023-08-03 MED ORDER — SILVER SULFADIAZINE 1 % EX CREA: 1.0000 | TOPICAL_CREAM | Freq: Two times a day (BID) | CUTANEOUS | 1 refills | Status: DC

## 2023-08-03 MED ORDER — SULFAMETHOXAZOLE-TRIMETHOPRIM 800-160 MG PO TABS: 1.0000 | ORAL_TABLET | Freq: Two times a day (BID) | ORAL | 1 refills | Status: DC

## 2023-08-03 NOTE — Telephone Encounter (Signed)
 Has HS flare, will rx septra  ds and silvadene 

## 2023-08-24 ENCOUNTER — Ambulatory Visit: Admitting: Family Medicine

## 2023-09-12 ENCOUNTER — Encounter: Payer: Self-pay | Admitting: Family Medicine

## 2023-09-12 ENCOUNTER — Ambulatory Visit: Admitting: Family Medicine

## 2023-09-12 VITALS — BP 138/84 | HR 126 | Temp 98.7°F | Wt 229.0 lb

## 2023-09-12 DIAGNOSIS — E785 Hyperlipidemia, unspecified: Secondary | ICD-10-CM

## 2023-09-12 DIAGNOSIS — J45909 Unspecified asthma, uncomplicated: Secondary | ICD-10-CM | POA: Diagnosis not present

## 2023-09-12 DIAGNOSIS — E063 Autoimmune thyroiditis: Secondary | ICD-10-CM | POA: Diagnosis not present

## 2023-09-12 DIAGNOSIS — I10 Essential (primary) hypertension: Secondary | ICD-10-CM

## 2023-09-12 DIAGNOSIS — R000 Tachycardia, unspecified: Secondary | ICD-10-CM

## 2023-09-12 MED ORDER — LEVOTHYROXINE SODIUM 25 MCG PO TABS: ORAL_TABLET | ORAL | 3 refills | Status: AC

## 2023-09-12 MED ORDER — ALBUTEROL SULFATE HFA 108 (90 BASE) MCG/ACT IN AERS: INHALATION_SPRAY | RESPIRATORY_TRACT | 3 refills | Status: DC

## 2023-09-12 MED ORDER — AMLODIPINE BESYLATE 5 MG PO TABS: ORAL_TABLET | ORAL | 3 refills | Status: DC

## 2023-09-12 MED ORDER — MONTELUKAST SODIUM 10 MG PO TABS
10.0000 mg | ORAL_TABLET | Freq: Every day | ORAL | 3 refills | Status: DC
Start: 2023-09-12 — End: 2024-02-16

## 2023-09-12 NOTE — Assessment & Plan Note (Signed)
 Uncontrolled. Adding Singulair.

## 2023-09-12 NOTE — Assessment & Plan Note (Signed)
 Inappropriate sinus tachycardia.  Discussed workup and treatment with patient.  Will reach out to cardiology for recommendations prior to referral.

## 2023-09-12 NOTE — Patient Instructions (Signed)
 Message sent to cardiology.  Medications sent.  Follow up annually.

## 2023-09-12 NOTE — Assessment & Plan Note (Signed)
 Stable.  Levothyroxine refilled.

## 2023-09-12 NOTE — Assessment & Plan Note (Signed)
 Stable.  Amlodipine  refilled.

## 2023-09-12 NOTE — Progress Notes (Signed)
 Subjective:  Patient ID: Hailey Holden, female    DOB: 12/17/1985  Age: 38 y.o. MRN: [id number]  CC:   Chief Complaint  Patient presents with   Annual Exam    Patient in room #1 and alone. Patient here today for her annual check up and medications management. Patient states she doesn't have any concerns or complaints.    HPI:  38 year old female presents for follow-up.  Patient states that overall she is doing fairly well.  Blood pressure stable on amlodipine .  Needs refill.  Hypothyroidism stable on levothyroxine .  Needs refill.  Patient has baseline tachycardia.  She states that she has had prior workup many years ago.  Currently asymptomatic.  Will discuss this today.  Patient reports that her asthma has recently been uncontrolled.  She states that she has had 2 recent attacks.  She is currently using albuterol  fairly frequently.  She also takes a daily antihistamine.  She would like to discuss additional treatment options to get her asthma better controlled.  Patient Active Problem List   Diagnosis Date Noted   Hyperlipidemia 09/12/2023   Hypothyroidism due to Hashimoto thyroiditis 06/15/2023   Obesity (BMI 30-39.9) 08/23/2021   Prediabetes 08/23/2021   Vitamin D  deficiency 05/17/2018   Tachycardia    Hidradenitis 10/17/2013   Essential hypertension 09/27/2012   PMS (premenstrual syndrome) 09/27/2012   Asthma 09/03/2012    Social Hx   Social History   Socioeconomic History   Marital status: Married    Spouse name: Sarai January   Number of children: 2   Years of education: Not on file   Highest education level: Not on file  Occupational History   Occupation: LPN     Employer: [employer]    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[employer]  Tobacco Use   Smoking status: Never   Smokeless tobacco: Never  Vaping Use   Vaping status: Never Used  Substance and Sexual Activity   Alcohol use: No   Drug use: No   Sexual activity: Yes    Birth control/protection: None, Coitus  interruptus  Other Topics Concern   Not on file  Social History Narrative   Not on file   Social Drivers of Health   Financial Resource Strain: Low Risk  (06/15/2023)   Overall Financial Resource Strain (CARDIA)    Difficulty of Paying Living Expenses: Not hard at all  Food Insecurity: No Food Insecurity (06/15/2023)   Hunger Vital Sign    Worried About Running Out of Food in the Last Year: Never true    Ran Out of Food in the Last Year: Never true  Transportation Needs: No Transportation Needs (06/15/2023)   PRAPARE - Administrator, Civil Service (Medical): No    Lack of Transportation (Non-Medical): No  Physical Activity: Inactive (06/15/2023)   Exercise Vital Sign    Days of Exercise per Week: 0 days    Minutes of Exercise per Session: 0 min  Stress: Stress Concern Present (06/15/2023)   [employer] of Occupational Health - Occupational Stress Questionnaire    Feeling of Stress : Very much  Social Connections: Socially Integrated (06/15/2023)   Social Connection and Isolation Panel [NHANES]    Frequency of Communication with Friends and Family: More than three times a week    Frequency of Social Gatherings with Friends and Family: More than three times a week    Attends Religious Services: More than 4 times per year    Active Member of [employer] or Organizations:  Yes    Attends Banker Meetings: More than 4 times per year    Marital Status: Married    Review of Systems Per HPI  Objective:  BP 138/84 (BP Location: Left Arm, Patient Position: Sitting, Cuff Size: Normal)   Pulse (!) 126   Temp 98.7 F (37.1 C)   Wt 229 lb (103.9 kg)   SpO2 98%   BMI 39.31 kg/m      09/12/2023    9:13 AM 06/15/2023   12:15 PM 06/15/2023   11:41 AM  BP/Weight  Systolic BP 138 139 139  Diastolic BP 84 96 90  Wt. (Lbs) 229  228  BMI 39.31 kg/m2  39.14 kg/m2    Physical Exam Vitals and nursing note reviewed.  Constitutional:      General: She is not in  acute distress. HENT:     Head: Normocephalic and atraumatic.  Cardiovascular:     Rate and Rhythm: Normal rate and regular rhythm.  Pulmonary:     Effort: Pulmonary effort is normal.     Breath sounds: Normal breath sounds. No wheezing or rales.  Neurological:     Mental Status: She is alert.  Psychiatric:        Mood and Affect: Mood normal.        Behavior: Behavior normal.     Lab Results  Component Value Date   WBC 10.4 06/15/2023   HGB 12.9 06/15/2023   HCT 40.2 06/15/2023   PLT 440 06/15/2023   GLUCOSE 86 06/15/2023   CHOL 227 (H) 06/15/2023   TRIG 133 06/15/2023   HDL 37 (L) 06/15/2023   LDLCALC 166 (H) 06/15/2023   ALT 22 06/15/2023   AST 20 06/15/2023   NA 138 06/15/2023   K 4.4 06/15/2023   CL 103 06/15/2023   CREATININE 0.59 06/15/2023   BUN 5 (L) 06/15/2023   CO2 21 06/15/2023   TSH 2.040 06/15/2023   HGBA1C 5.7 (H) 06/15/2023     Assessment & Plan:  Essential hypertension Assessment & Plan: Stable.  Amlodipine  refilled.  Orders: -     amLODIPine  Besylate; TAKE ONE TABLET (5MG  TOTAL) BY MOUTH DAILY  Dispense: 90 tablet; Refill: 3  Hyperlipidemia, unspecified hyperlipidemia type  Persistent asthma without complication, unspecified asthma severity Assessment & Plan: Uncontrolled.  Adding Singulair .   Hypothyroidism due to Hashimoto thyroiditis Assessment & Plan: Stable.  Levothyroxine  refilled.   Tachycardia Assessment & Plan: Inappropriate sinus tachycardia.  Discussed workup and treatment with patient.  Will reach out to cardiology for recommendations prior to referral.   Other orders -     Albuterol  Sulfate HFA; INHALE TWO PUFFS INTO THE LUNGS EVERY FOUR HOURS AS NEEDED FOR WHEEZING  Dispense: 6.7 g; Refill: 3 -     Levothyroxine  Sodium; TAKE ONE (1) TABLET BY MOUTH EVERY DAY BEFORE BREAKFAST  Dispense: 90 tablet; Refill: 3 -     Montelukast  Sodium; Take 1 tablet (10 mg total) by mouth at bedtime.  Dispense: 90 tablet; Refill:  3    Follow-up:  Annually  Kathleen Papa DO [facility]

## 2023-09-19 ENCOUNTER — Other Ambulatory Visit: Payer: Self-pay | Admitting: Adult Health

## 2023-09-19 DIAGNOSIS — Z3201 Encounter for pregnancy test, result positive: Secondary | ICD-10-CM

## 2023-09-19 NOTE — Progress Notes (Signed)
 Had +HPT will check QHCG and progesterone  level

## 2023-09-20 DIAGNOSIS — Z3201 Encounter for pregnancy test, result positive: Secondary | ICD-10-CM | POA: Diagnosis not present

## 2023-09-21 ENCOUNTER — Ambulatory Visit: Payer: Self-pay | Admitting: Adult Health

## 2023-09-21 DIAGNOSIS — Z3201 Encounter for pregnancy test, result positive: Secondary | ICD-10-CM

## 2023-09-21 LAB — BETA HCG QUANT (REF LAB): hCG Quant: 645 m[IU]/mL

## 2023-09-21 LAB — PROGESTERONE: Progesterone: 9.2 ng/mL

## 2023-09-21 MED ORDER — PROGESTERONE 200 MG PO CAPS: ORAL_CAPSULE | ORAL | 3 refills | Status: DC

## 2023-09-22 DIAGNOSIS — Z3201 Encounter for pregnancy test, result positive: Secondary | ICD-10-CM | POA: Diagnosis not present

## 2023-09-23 LAB — BETA HCG QUANT (REF LAB): hCG Quant: 1242 m[IU]/mL

## 2023-09-27 ENCOUNTER — Other Ambulatory Visit: Payer: Self-pay | Admitting: Adult Health

## 2023-09-27 DIAGNOSIS — O3680X0 Pregnancy with inconclusive fetal viability, not applicable or unspecified: Secondary | ICD-10-CM

## 2023-09-27 NOTE — Progress Notes (Signed)
 US  July 2 at 7:30 am at [facility], for dates

## 2023-10-04 ENCOUNTER — Other Ambulatory Visit: Payer: Self-pay | Admitting: Adult Health

## 2023-10-04 ENCOUNTER — Ambulatory Visit (HOSPITAL_COMMUNITY)
Admission: RE | Admit: 2023-10-04 | Discharge: 2023-10-04 | Disposition: A | Discharge status: Home / Self Care | Source: Ambulatory Visit | Attending: Adult Health | Admitting: Adult Health

## 2023-10-04 DIAGNOSIS — O3680X0 Pregnancy with inconclusive fetal viability, not applicable or unspecified: Secondary | ICD-10-CM | POA: Insufficient documentation

## 2023-10-04 DIAGNOSIS — Z3687 Encounter for antenatal screening for uncertain dates: Secondary | ICD-10-CM | POA: Diagnosis not present

## 2023-10-04 DIAGNOSIS — O3481 Maternal care for other abnormalities of pelvic organs, first trimester: Secondary | ICD-10-CM | POA: Diagnosis not present

## 2023-10-04 DIAGNOSIS — O208 Other hemorrhage in early pregnancy: Secondary | ICD-10-CM | POA: Diagnosis not present

## 2023-10-04 DIAGNOSIS — O09511 Supervision of elderly primigravida, first trimester: Secondary | ICD-10-CM | POA: Diagnosis not present

## 2023-10-10 ENCOUNTER — Ambulatory Visit: Payer: Self-pay | Admitting: Adult Health

## 2023-11-15 DIAGNOSIS — Z3480 Encounter for supervision of other normal pregnancy, unspecified trimester: Secondary | ICD-10-CM | POA: Insufficient documentation

## 2023-11-15 DIAGNOSIS — O0990 Supervision of high risk pregnancy, unspecified, unspecified trimester: Secondary | ICD-10-CM | POA: Insufficient documentation

## 2023-11-16 ENCOUNTER — Ambulatory Visit: Admitting: *Deleted

## 2023-11-16 ENCOUNTER — Encounter: Payer: Self-pay | Admitting: Women's Health

## 2023-11-16 ENCOUNTER — Other Ambulatory Visit (HOSPITAL_COMMUNITY)
Admission: RE | Admit: 2023-11-16 | Discharge: 2023-11-16 | Disposition: A | Discharge status: Home / Self Care | Source: Ambulatory Visit | Attending: Women's Health | Admitting: Women's Health

## 2023-11-16 ENCOUNTER — Ambulatory Visit: Admitting: Women's Health

## 2023-11-16 VITALS — BP 130/88 | HR 137 | Wt 222.0 lb

## 2023-11-16 DIAGNOSIS — O99281 Endocrine, nutritional and metabolic diseases complicating pregnancy, first trimester: Secondary | ICD-10-CM | POA: Diagnosis not present

## 2023-11-16 DIAGNOSIS — Z3481 Encounter for supervision of other normal pregnancy, first trimester: Secondary | ICD-10-CM | POA: Diagnosis not present

## 2023-11-16 DIAGNOSIS — Z3A12 12 weeks gestation of pregnancy: Secondary | ICD-10-CM

## 2023-11-16 DIAGNOSIS — E039 Hypothyroidism, unspecified: Secondary | ICD-10-CM

## 2023-11-16 DIAGNOSIS — Z3480 Encounter for supervision of other normal pregnancy, unspecified trimester: Secondary | ICD-10-CM

## 2023-11-16 DIAGNOSIS — E063 Autoimmune thyroiditis: Secondary | ICD-10-CM

## 2023-11-16 DIAGNOSIS — O0990 Supervision of high risk pregnancy, unspecified, unspecified trimester: Secondary | ICD-10-CM

## 2023-11-16 DIAGNOSIS — O10011 Pre-existing essential hypertension complicating pregnancy, first trimester: Secondary | ICD-10-CM

## 2023-11-16 DIAGNOSIS — O0991 Supervision of high risk pregnancy, unspecified, first trimester: Secondary | ICD-10-CM

## 2023-11-16 DIAGNOSIS — Z131 Encounter for screening for diabetes mellitus: Secondary | ICD-10-CM

## 2023-11-16 MED ORDER — ASPIRIN 81 MG PO TBEC: 162.0000 mg | DELAYED_RELEASE_TABLET | Freq: Every day | ORAL | 2 refills | Status: DC

## 2023-11-16 NOTE — Patient Instructions (Signed)
 Hailey Holden, thank you for choosing our office today! We appreciate the opportunity to meet your healthcare needs. You may receive a short survey by mail, e-mail, or through [employer]. If you are happy with your care we would appreciate if you could take just a few minutes to complete the survey questions. We read all of your comments and take your feedback very seriously. Thank you again for choosing our office.  Center for [employer] [facility] Team at [facility]  [facility] at [facility] ([address] [place], [place] [address]) Entrance C, located off of [address] Free 24/7 valet parking   Nausea & Vomiting Have saltine crackers or pretzels by your bed and eat a few bites before you raise your head out of bed in the morning Eat small frequent meals throughout the day instead of large meals Drink plenty of fluids throughout the day to stay hydrated, just don't drink a lot of fluids with your meals.  This can make your stomach fill up faster making you feel sick Do not brush your teeth right after you eat Products with real ginger are good for nausea, like ginger ale and ginger hard candy Make sure it says made with real ginger! Sucking on sour candy like lemon heads is also good for nausea If your prenatal vitamins make you nauseated, take them at night so you will sleep through the nausea Sea Bands If you feel like you need medicine for the nausea & vomiting please let us know If you are unable to keep any fluids or food down please let us know   Constipation Drink plenty of fluid, preferably water, throughout the day Eat foods high in fiber such as fruits, vegetables, and grains Exercise, such as walking, is a good way to keep your bowels regular Drink warm fluids, especially warm prune juice, or decaf coffee Eat a 1/2 cup of real oatmeal (not instant), 1/2 cup applesauce, and 1/2-1 cup warm prune juice every day If needed, you may take Colace (docusate sodium) stool softener  once or twice a day to help keep the stool soft.  If you still are having problems with constipation, you may take Miralax once daily as needed to help keep your bowels regular.   Home Blood Pressure Monitoring for Patients   Your provider has recommended that you check your blood pressure (BP) at least once a week at home. If you do not have a blood pressure cuff at home, one will be provided for you. Contact your provider if you have not received your monitor within 1 week.   Helpful Tips for Accurate Home Blood Pressure Checks  Don't smoke, exercise, or drink caffeine 30 minutes before checking your BP Use the restroom before checking your BP (a full bladder can raise your pressure) Relax in a comfortable upright chair Feet on the ground Left arm resting comfortably on a flat surface at the level of your heart Legs uncrossed Back supported Sit quietly and don't talk Place the cuff on your bare arm Adjust snuggly, so that only two fingertips can fit between your skin and the top of the cuff Check 2 readings separated by at least one minute Keep a log of your BP readings For a visual, please reference this diagram: http://ccnc.care/bpdiagram  Provider Name: [facility]     Phone: [phone]  Zone 1: ALL CLEAR  Continue to monitor your symptoms:  BP reading is less than 140 (top number) or less than 90 (bottom  number)  No right upper stomach pain No headaches or seeing spots No feeling nauseated or throwing up No swelling in face and hands  Zone 2: CAUTION Call your doctor's office for any of the following:  BP reading is greater than 140 (top number) or greater than 90 (bottom number)  Stomach pain under your ribs in the middle or right side Headaches or seeing spots Feeling nauseated or throwing up Swelling in face and hands  Zone 3: EMERGENCY  Seek immediate medical care if you have any of the following:  BP reading is greater than160 (top number) or greater than  110 (bottom number) Severe headaches not improving with Tylenol Serious difficulty catching your breath Any worsening symptoms from Zone 2    First Trimester of Pregnancy The first trimester of pregnancy is from week 1 until the end of week 12 (months 1 through 3). A week after a sperm fertilizes an egg, the egg will implant on the wall of the uterus. This embryo will begin to develop into a baby. Genes from you and your partner are forming the baby. The female genes determine whether the baby is a boy or a girl. At 6-8 weeks, the eyes and face are formed, and the heartbeat can be seen on ultrasound. At the end of 12 weeks, all the baby's organs are formed.  Now that you are pregnant, you will want to do everything you can to have a healthy baby. Two of the most important things are to get good prenatal care and to follow your health care provider's instructions. Prenatal care is all the medical care you receive before the baby's birth. This care will help prevent, find, and treat any problems during the pregnancy and childbirth. BODY CHANGES Your body goes through many changes during pregnancy. The changes vary from woman to woman.  You may gain or lose a couple of pounds at first. You may feel sick to your stomach (nauseous) and throw up (vomit). If the vomiting is uncontrollable, call your health care provider. You may tire easily. You may develop headaches that can be relieved by medicines approved by your health care provider. You may urinate more often. Painful urination may mean you have a bladder infection. You may develop heartburn as a result of your pregnancy. You may develop constipation because certain hormones are causing the muscles that push waste through your intestines to slow down. You may develop hemorrhoids or swollen, bulging veins (varicose veins). Your breasts may begin to grow larger and become tender. Your nipples may stick out more, and the tissue that surrounds them  (areola) may become darker. Your gums may bleed and may be sensitive to brushing and flossing. Dark spots or blotches (chloasma, mask of pregnancy) may develop on your face. This will likely fade after the baby is born. Your menstrual periods will stop. You may have a loss of appetite. You may develop cravings for certain kinds of food. You may have changes in your emotions from day to day, such as being excited to be pregnant or being concerned that something may go wrong with the pregnancy and baby. You may have more vivid and strange dreams. You may have changes in your hair. These can include thickening of your hair, rapid growth, and changes in texture. Some women also have hair loss during or after pregnancy, or hair that feels dry or thin. Your hair will most likely return to normal after your baby is born. WHAT TO EXPECT AT YOUR PRENATAL  VISITS During a routine prenatal visit: You will be weighed to make sure you and the baby are growing normally. Your blood pressure will be taken. Your abdomen will be measured to track your baby's growth. The fetal heartbeat will be listened to starting around week 10 or 12 of your pregnancy. Test results from any previous visits will be discussed. Your health care provider may ask you: How you are feeling. If you are feeling the baby move. If you have had any abnormal symptoms, such as leaking fluid, bleeding, severe headaches, or abdominal cramping. If you have any questions. Other tests that may be performed during your first trimester include: Blood tests to find your blood type and to check for the presence of any previous infections. They will also be used to check for low iron levels (anemia) and Rh antibodies. Later in the pregnancy, blood tests for diabetes will be done along with other tests if problems develop. Urine tests to check for infections, diabetes, or protein in the urine. An ultrasound to confirm the proper growth and development  of the baby. An amniocentesis to check for possible genetic problems. Fetal screens for spina bifida and Down syndrome. You may need other tests to make sure you and the baby are doing well. HOME CARE INSTRUCTIONS  Medicines Follow your health care provider's instructions regarding medicine use. Specific medicines may be either safe or unsafe to take during pregnancy. Take your prenatal vitamins as directed. If you develop constipation, try taking a stool softener if your health care provider approves. Diet Eat regular, well-balanced meals. Choose a variety of foods, such as meat or vegetable-based protein, fish, milk and low-fat dairy products, vegetables, fruits, and whole grain breads and cereals. Your health care provider will help you determine the amount of weight gain that is right for you. Avoid raw meat and uncooked cheese. These carry germs that can cause birth defects in the baby. Eating four or five small meals rather than three large meals a day may help relieve nausea and vomiting. If you start to feel nauseous, eating a few soda crackers can be helpful. Drinking liquids between meals instead of during meals also seems to help nausea and vomiting. If you develop constipation, eat more high-fiber foods, such as fresh vegetables or fruit and whole grains. Drink enough fluids to keep your urine clear or pale yellow. Activity and Exercise Exercise only as directed by your health care provider. Exercising will help you: Control your weight. Stay in shape. Be prepared for labor and delivery. Experiencing pain or cramping in the lower abdomen or low back is a good sign that you should stop exercising. Check with your health care provider before continuing normal exercises. Try to avoid standing for long periods of time. Move your legs often if you must stand in one place for a long time. Avoid heavy lifting. Wear low-heeled shoes, and practice good posture. You may continue to have sex  unless your health care provider directs you otherwise. Relief of Pain or Discomfort Wear a good support bra for breast tenderness.   Take warm sitz baths to soothe any pain or discomfort caused by hemorrhoids. Use hemorrhoid cream if your health care provider approves.   Rest with your legs elevated if you have leg cramps or low back pain. If you develop varicose veins in your legs, wear support hose. Elevate your feet for 15 minutes, 3-4 times a day. Limit salt in your diet. Prenatal Care Schedule your prenatal visits by the  twelfth week of pregnancy. They are usually scheduled monthly at first, then more often in the last 2 months before delivery. Write down your questions. Take them to your prenatal visits. Keep all your prenatal visits as directed by your health care provider. Safety Wear your seat belt at all times when driving. Make a list of emergency phone numbers, including numbers for family, friends, the hospital, and police and fire departments. General Tips Ask your health care provider for a referral to a local prenatal education class. Begin classes no later than at the beginning of month 6 of your pregnancy. Ask for help if you have counseling or nutritional needs during pregnancy. Your health care provider can offer advice or refer you to specialists for help with various needs. Do not use hot tubs, steam rooms, or saunas. Do not douche or use tampons or scented sanitary pads. Do not cross your legs for long periods of time. Avoid cat litter boxes and soil used by cats. These carry germs that can cause birth defects in the baby and possibly loss of the fetus by miscarriage or stillbirth. Avoid all smoking, herbs, alcohol, and medicines not prescribed by your health care provider. Chemicals in these affect the formation and growth of the baby. Schedule a dentist appointment. At home, brush your teeth with a soft toothbrush and be gentle when you floss. SEEK MEDICAL CARE IF:   You have dizziness. You have mild pelvic cramps, pelvic pressure, or nagging pain in the abdominal area. You have persistent nausea, vomiting, or diarrhea. You have a bad smelling vaginal discharge. You have pain with urination. You notice increased swelling in your face, hands, legs, or ankles. SEEK IMMEDIATE MEDICAL CARE IF:  You have a fever. You are leaking fluid from your vagina. You have spotting or bleeding from your vagina. You have severe abdominal cramping or pain. You have rapid weight gain or loss. You vomit blood or material that looks like coffee grounds. You are exposed to [place] measles and have never had them. You are exposed to fifth disease or chickenpox. You develop a severe headache. You have shortness of breath. You have any kind of trauma, such as from a fall or a car accident. Document Released: 03/15/2001 Document Revised: 08/05/2013 Document Reviewed: 01/29/2013 [facility] Patient Information 2015 [place], [place]. This information is not intended to replace advice given to you by your health care provider. Make sure you discuss any questions you have with your health care provider.

## 2023-11-16 NOTE — Progress Notes (Signed)
 INITIAL OBSTETRICAL VISIT Patient name: Hailey Holden MRN [id number]  Date of birth: 1985/06/19 Chief Complaint:   Initial Prenatal Visit  History of Present Illness:   Hailey Holden is a 38 y.o. [id number] Caucasian female at [redacted]w[redacted]d by US  at 6 weeks with an Estimated Date of Delivery: 05/24/24 being seen today for her initial obstetrical visit.   Patient's last menstrual period was 05/21/2023 (exact date). Her obstetrical history is significant for term SVB x 2, SAB x 1.   Today she reports nausea, zofran helps.  Last pap 04/27/22. Results were: NILM w/ HRHPV negative     11/16/2023    3:23 PM 09/12/2023    9:17 AM 06/15/2023   11:40 AM 08/24/2022    1:16 PM 04/27/2022   10:32 AM  Depression screen PHQ 2/9  Decreased Interest 0 0 0 0 1  Down, Depressed, Hopeless 0 0 1 0 0  PHQ - 2 Score 0 0 1 0 1  Altered sleeping 1 1 3 1 1   Tired, decreased energy 1 1 3 1 3   Change in appetite 0 1 3 1 1   Feeling bad or failure about yourself  0 0 1 0 0  Trouble concentrating 0 1 2 0 2  Moving slowly or fidgety/restless 0 0 2 0 0  Suicidal thoughts 0 0 0 0 0  PHQ-9 Score 2 4 15 3 8   Difficult doing work/chores  Not difficult at all  Not difficult at all         11/16/2023    3:23 PM 09/12/2023    9:17 AM 06/15/2023   11:40 AM 08/24/2022    1:17 PM  GAD 7 : Generalized Anxiety Score  Nervous, Anxious, on Edge 0 0 0 0  Control/stop worrying 0 0 0 0  Worry too much - different things 0 0 0 0  Trouble relaxing 0 1 1 0  Restless 0 0 1 0  Easily annoyed or irritable 0 1 1 0  Afraid - awful might happen 0 0 0 0  Total GAD 7 Score 0 2 3 0  Anxiety Difficulty  Not difficult at all  Not difficult at all     Review of Systems:   Pertinent items are noted in HPI Denies cramping/contractions, leakage of fluid, vaginal bleeding, abnormal vaginal discharge w/ itching/odor/irritation, headaches, visual changes, shortness of breath, chest pain, abdominal pain, severe nausea/vomiting, or problems with  urination or bowel movements unless otherwise stated above.  Pertinent History Reviewed:  Reviewed past medical,surgical, social, obstetrical and family history.  Reviewed problem list, medications and allergies. OB History  Gravida Para Term Preterm AB Living  4 2 2  1 2   SAB IAB Ectopic Multiple Live Births  1    2    # Outcome Date GA Lbr Len/2nd Weight Sex Type Anes PTL Lv  4 Current           3 SAB 2022          2 Term 04/01/10 [redacted]w[redacted]d  7 lb 4 oz (3.289 kg) F Vag-Spont   LIV  1 Term 08/24/06 [redacted]w[redacted]d  8 lb 4 oz (3.742 kg) M Vag-Spont   LIV   Physical Assessment:   Vitals:   11/16/23 1457  BP: 130/88  Pulse: (!) 137  Weight: 222 lb (100.7 kg)  Body mass index is 38.11 kg/m.       Physical Examination:  General appearance - well appearing, and in no distress  Mental status -  alert, oriented to person, place, and time  Psych:  She has a normal mood and affect  Skin - warm and dry, normal color, no suspicious lesions noted  Chest - effort normal, all lung fields clear to auscultation bilaterally  Heart - normal rate and regular rhythm  Abdomen - soft, nontender  Extremities:  No swelling or varicosities noted  Thin prep pap is not done   Chaperone: N/A  TODAY'S informal TA u/s> +FCA and active fetus  No results found for this or any previous visit (from the past 24 hours).  Assessment & Plan:  1) High-Risk Pregnancy [id number] at [redacted]w[redacted]d with an Estimated Date of Delivery: 05/24/24   2) Initial OB visit  3) CHTN> on norvasc 5mg , start ASA, baseline labs  4) Hypothyroid> on synthroid 25mcg, check TSH today  5) PGBMI 39  6) AMA 38yo  Meds:  Meds ordered this encounter  Medications   aspirin EC 81 MG tablet    Sig: Take 2 tablets (162 mg total) by mouth daily. Swallow whole.    Dispense:  180 tablet    Refill:  2    Initial labs obtained Continue prenatal vitamins Reviewed n/v relief measures and warning s/s to report Reviewed recommended weight gain based on  pre-gravid BMI Encouraged well-balanced diet Genetic & carrier screening discussed: declines Panorama, NT/IT, AFP, and Horizon  Ultrasound discussed; fetal survey: requested CCNC completed> form faxed if has or is planning to apply for medicaid The nature of [employer] for [employer] with multiple MDs and other Advanced Practice Providers was explained to patient; also emphasized that fellows, residents, and students are part of our team. Does have home bp cuff. Office bp cuff given: no. Rx sent: n/a. Check bp weekly, let us  know if consistently >140/90.   Follow-up: Return in about 4 weeks (around 12/14/2023) for HROB , MD or CNM, in person; then 20wks for anatomy u/s and HROB MD/CNM.   Orders Placed This Encounter  Procedures   Urine Culture   CBC/D/Plt+RPR+Rh+ABO+RubIgG...   Comprehensive metabolic panel with GFR   Hemoglobin A1c   Protein / creatinine ratio, urine   TSH    Suzen JONELLE Fetters CNM, [facility] 11/16/2023 4:47 PM

## 2023-11-17 LAB — PROTEIN / CREATININE RATIO, URINE
Creatinine, Urine: 115.7 mg/dL
Protein, Ur: 11.9 mg/dL
Protein/Creat Ratio: 103 mg/g{creat} (ref 0–200)

## 2023-11-17 LAB — CBC/D/PLT+RPR+RH+ABO+RUBIGG...
Antibody Screen: NEGATIVE
Basophils Absolute: 0.1 x10E3/uL (ref 0.0–0.2)
Basos: 1 %
EOS (ABSOLUTE): 0.2 x10E3/uL (ref 0.0–0.4)
Eos: 2 %
HCV Ab: NONREACTIVE
HIV Screen 4th Generation wRfx: NONREACTIVE
Hematocrit: 39.6 % (ref 34.0–46.6)
Hemoglobin: 12.8 g/dL (ref 11.1–15.9)
Hepatitis B Surface Ag: NEGATIVE
Immature Grans (Abs): 0 x10E3/uL (ref 0.0–0.1)
Immature Granulocytes: 0 %
Lymphocytes Absolute: 2.8 x10E3/uL (ref 0.7–3.1)
Lymphs: 24 %
MCH: 28 pg (ref 26.6–33.0)
MCHC: 32.3 g/dL (ref 31.5–35.7)
MCV: 87 fL (ref 79–97)
Monocytes Absolute: 1 x10E3/uL — ABNORMAL HIGH (ref 0.1–0.9)
Monocytes: 8 %
Neutrophils Absolute: 7.7 x10E3/uL — ABNORMAL HIGH (ref 1.4–7.0)
Neutrophils: 65 %
Platelets: 414 x10E3/uL (ref 150–450)
RBC: 4.57 x10E6/uL (ref 3.77–5.28)
RDW: 14.9 % (ref 11.7–15.4)
RPR Ser Ql: NONREACTIVE
Rh Factor: POSITIVE
Rubella Antibodies, IGG: 2.48 {index} (ref >0.99)
WBC: 11.7 x10E3/uL — ABNORMAL HIGH (ref 3.4–10.8)

## 2023-11-17 LAB — HEMOGLOBIN A1C
Est. average glucose Bld gHb Est-mCnc: 105 mg/dL
Hgb A1c MFr Bld: 5.3 % (ref 4.8–5.6)

## 2023-11-17 LAB — COMPREHENSIVE METABOLIC PANEL WITH GFR
ALT: 16 IU/L (ref 0–32)
AST: 16 IU/L (ref 0–40)
Albumin: 3.9 g/dL (ref 3.9–4.9)
Alkaline Phosphatase: 159 IU/L — ABNORMAL HIGH (ref 44–121)
BUN/Creatinine Ratio: 11 (ref 9–23)
BUN: 5 mg/dL — ABNORMAL LOW (ref 6–20)
Bilirubin Total: 0.5 mg/dL (ref 0.0–1.2)
CO2: 19 mmol/L — ABNORMAL LOW (ref 20–29)
Calcium: 9.5 mg/dL (ref 8.7–10.2)
Chloride: 100 mmol/L (ref 96–106)
Creatinine, Ser: 0.47 mg/dL — ABNORMAL LOW (ref 0.57–1.00)
Globulin, Total: 3.1 g/dL (ref 1.5–4.5)
Glucose: 81 mg/dL (ref 70–99)
Potassium: 4 mmol/L (ref 3.5–5.2)
Sodium: 135 mmol/L (ref 134–144)
Total Protein: 7 g/dL (ref 6.0–8.5)
eGFR: 124 mL/min/1.73 (ref >59)

## 2023-11-17 LAB — TSH: TSH: 0.179 u[IU]/mL — ABNORMAL LOW (ref 0.450–4.500)

## 2023-11-17 LAB — HCV INTERPRETATION

## 2023-11-18 LAB — URINE CULTURE

## 2023-11-20 ENCOUNTER — Ambulatory Visit: Payer: Self-pay | Admitting: Women's Health

## 2023-11-20 DIAGNOSIS — O10919 Unspecified pre-existing hypertension complicating pregnancy, unspecified trimester: Secondary | ICD-10-CM | POA: Insufficient documentation

## 2023-11-20 DIAGNOSIS — O0990 Supervision of high risk pregnancy, unspecified, unspecified trimester: Secondary | ICD-10-CM

## 2023-11-20 DIAGNOSIS — E063 Autoimmune thyroiditis: Secondary | ICD-10-CM

## 2023-11-20 LAB — CERVICOVAGINAL ANCILLARY ONLY
Chlamydia: NEGATIVE
Comment: NEGATIVE
Comment: NORMAL
Neisseria Gonorrhea: NEGATIVE

## 2023-12-14 ENCOUNTER — Ambulatory Visit (INDEPENDENT_AMBULATORY_CARE_PROVIDER_SITE_OTHER): Admitting: Women's Health

## 2023-12-14 ENCOUNTER — Encounter: Payer: Self-pay | Admitting: Women's Health

## 2023-12-14 VITALS — BP 136/89 | HR 80 | Wt 221.5 lb

## 2023-12-14 DIAGNOSIS — Z363 Encounter for antenatal screening for malformations: Secondary | ICD-10-CM

## 2023-12-14 DIAGNOSIS — Z3A16 16 weeks gestation of pregnancy: Secondary | ICD-10-CM

## 2023-12-14 DIAGNOSIS — O10912 Unspecified pre-existing hypertension complicating pregnancy, second trimester: Secondary | ICD-10-CM

## 2023-12-14 DIAGNOSIS — O0992 Supervision of high risk pregnancy, unspecified, second trimester: Secondary | ICD-10-CM

## 2023-12-14 DIAGNOSIS — O1212 Gestational proteinuria, second trimester: Secondary | ICD-10-CM | POA: Diagnosis not present

## 2023-12-14 DIAGNOSIS — O10919 Unspecified pre-existing hypertension complicating pregnancy, unspecified trimester: Secondary | ICD-10-CM

## 2023-12-14 DIAGNOSIS — O0990 Supervision of high risk pregnancy, unspecified, unspecified trimester: Secondary | ICD-10-CM

## 2023-12-14 DIAGNOSIS — O09522 Supervision of elderly multigravida, second trimester: Secondary | ICD-10-CM

## 2023-12-14 LAB — POCT URINALYSIS DIPSTICK OB
Blood, UA: NEGATIVE
Glucose, UA: NEGATIVE
Ketones, UA: NEGATIVE
Nitrite, UA: NEGATIVE

## 2023-12-14 NOTE — Progress Notes (Signed)
 HIGH-RISK PREGNANCY VISIT Patient name: Hailey Holden MRN [id number]  Date of birth: October 11, 1985 Chief Complaint:   Routine Prenatal Visit  History of Present Illness:   PUNEET SELDEN is a 38 y.o. [id number] female at [redacted]w[redacted]d with an Estimated Date of Delivery: 05/24/24 being seen today for ongoing management of a high-risk pregnancy complicated by chronic hypertension currently on norvasc  5mg  and hypothyroidism on meds, AMA.    Today she reports bp up b/c she was excited/talking during the 1st one. No UTI sx. Some RLP. Contractions: Not present. Vag. Bleeding: None.  Movement: Absent. denies leaking of fluid.      11/16/2023    3:23 PM 09/12/2023    9:17 AM 06/15/2023   11:40 AM 08/24/2022    1:16 PM 04/27/2022   10:32 AM  Depression screen PHQ 2/9  Decreased Interest 0 0 0 0 1  Down, Depressed, Hopeless 0 0 1 0 0  PHQ - 2 Score 0 0 1 0 1  Altered sleeping 1 1 3 1 1   Tired, decreased energy 1 1 3 1 3   Change in appetite 0 1 3 1 1   Feeling bad or failure about yourself  0 0 1 0 0  Trouble concentrating 0 1 2 0 2  Moving slowly or fidgety/restless 0 0 2 0 0  Suicidal thoughts 0 0 0 0 0  PHQ-9 Score 2 4 15 3 8   Difficult doing work/chores  Not difficult at all  Not difficult at all         11/16/2023    3:23 PM 09/12/2023    9:17 AM 06/15/2023   11:40 AM 08/24/2022    1:17 PM  GAD 7 : Generalized Anxiety Score  Nervous, Anxious, on Edge 0 0 0 0  Control/stop worrying 0 0 0 0  Worry too much - different things 0 0 0 0  Trouble relaxing 0 1 1 0  Restless 0 0 1 0  Easily annoyed or irritable 0 1 1 0  Afraid - awful might happen 0 0 0 0  Total GAD 7 Score 0 2 3 0  Anxiety Difficulty  Not difficult at all  Not difficult at all     Review of Systems:   Pertinent items are noted in HPI Denies abnormal vaginal discharge w/ itching/odor/irritation, headaches, visual changes, shortness of breath, chest pain, abdominal pain, severe nausea/vomiting, or problems with urination or bowel  movements unless otherwise stated above. Pertinent History Reviewed:  Reviewed past medical,surgical, social, obstetrical and family history.  Reviewed problem list, medications and allergies. Physical Assessment:   Vitals:   12/14/23 1613 12/14/23 1624  BP: (!) 148/80 136/89  Pulse: 100 80  Weight: 221 lb 8 oz (100.5 kg)   Body mass index is 38.02 kg/m.           Physical Examination:   General appearance: alert, well appearing, and in no distress  Mental status: alert, oriented to person, place, and time  Skin: warm & dry   Extremities:      Cardiovascular: normal heart rate noted  Respiratory: normal respiratory effort, no distress  Abdomen: gravid, soft, non-tender  Pelvic: Cervical exam deferred         Fetal Status: Fetal Heart Rate (bpm): 147   Movement: Absent    Fetal Surveillance Testing today: doppler   Chaperone: N/A  Results for orders placed or performed in visit on 12/14/23 (from the past 24 hours)  POC Urinalysis Dipstick OB   Collection Time:  12/14/23  4:25 PM  Result Value Ref Range   Color, UA     Clarity, UA     Glucose, UA Negative Negative   Bilirubin, UA     Ketones, UA neg    Spec Grav, UA     Blood, UA neg    pH, UA     POC,PROTEIN,UA Moderate (2+) Negative, Trace, Small (1+), Moderate (2+), Large (3+), 4+   Urobilinogen, UA     Nitrite, UA neg    Leukocytes, UA Trace (A) Negative   Appearance     Odor      Assessment & Plan:  High-risk pregnancy: [id number] at [redacted]w[redacted]d with an Estimated Date of Delivery: 05/24/24   1) CHTN, stable on norvasc  5mg , ASA, 1st bp up (was excited/talking)  2) Hypothyroidism on meds, TSH a little low at [facility], repeat next visit  3) AMA  4) Proteinuria> 2+, no UTI sx, will send cx, P:C was normal at NOB  Meds: No orders of the defined types were placed in this encounter.   Labs/procedures today: urine culture  Treatment Plan: EFW q 4w @ 24w    2x/wk testing nst/sono @ 32wks     Deliver 37-39.0wks (or as per  MFM w/ poor control)____   Reviewed: Preterm labor symptoms and general obstetric precautions including but not limited to vaginal bleeding, contractions, leaking of fluid and fetal movement were reviewed in detail with the patient.  All questions were answered. Does have home bp cuff. Office bp cuff given: not applicable. Check bp weekly, let us  know if consistently >140 and/or >90.  Follow-up: Return for As scheduled.   Future Appointments  Date Time Provider Department Center  01/08/2024  1:30 PM [facility] US  [facility] None  01/08/2024  2:30 PM Delores Nidia CROME, FNP [facility]    Orders Placed This Encounter  Procedures   Urine Culture   US  OB Comp + 14 Wk   POC Urinalysis Dipstick OB   Suzen JONELLE Fetters [place], [facility] 12/14/2023 4:41 PM

## 2023-12-14 NOTE — Patient Instructions (Signed)
 Hailey Holden, thank you for choosing our office today! We appreciate the opportunity to meet your healthcare needs. You may receive a short survey by mail, e-mail, or through [employer]. If you are happy with your care we would appreciate if you could take just a few minutes to complete the survey questions. We read all of your comments and take your feedback very seriously. Thank you again for choosing our office.  Center for [employer] Team at [facility] at [facility] ([address] [place], [place] [address]) Entrance C, located off of [address] Free 24/7 valet parking  Go to [email or web address] to register for FREE online childbirth classes  Call the office [phone]) or go to [facility] if: You begin to severe cramping Your water breaks.  Sometimes it is a big gush of fluid, sometimes it is just a trickle that keeps getting your panties wet or running down your legs You have vaginal bleeding.  It is normal to have a small amount of spotting if your cervix was checked.   [facility] Pediatricians/Family Doctors [facility]): [address] Colette Ribas, [phone]           [facility]: [address], [phone]                [facility] Medicine [facility]): [address], [phone] (call to ask if accepting patients) [facility] Department: [address], [place], [phone]    [facility] Pediatricians/Family Doctors [facility]): [phone], [phone] [facility]: [address] [place], [phone] [facility]: [address], [phone]  [facility] Doctors  [facility]): [phone] [facility]: [address], [phone]   [facility] Doctors [facility]: [address] [phone]  [facility] Doctors  [facility]: [phone], [phone]  Home Blood Pressure Monitoring for Patients   Your provider has recommended that you check your blood pressure (BP) at least once a week at home. If you do not have a blood pressure cuff at home, one will be provided for you. Contact your provider if you have not received your monitor within 1 week.   Helpful Tips for Accurate Home Blood Pressure Checks  Don't smoke, exercise, or drink caffeine 30 minutes before checking your BP Use the restroom before checking your BP (a full bladder can raise your pressure) Relax in a comfortable upright chair Feet on the ground Left arm resting comfortably on a flat surface at the level of your heart Legs uncrossed Back supported Sit quietly and don't talk Place the cuff on your bare arm Adjust snuggly, so that only two fingertips can fit between your skin and the top of the cuff Check 2 readings separated by at least one minute Keep a log of your BP readings For a visual, please reference this diagram: http://ccnc.care/bpdiagram  Provider Name: [facility]     Phone: [phone]  Zone 1: ALL CLEAR  Continue to monitor your symptoms:  BP reading is less than 140 (top number) or less than 90 (bottom number)  No right upper stomach pain No headaches or seeing spots No feeling nauseated or throwing up No swelling in face and hands  Zone 2: CAUTION Call your doctor's office for any of the following:  BP reading is greater than 140 (top number) or greater than  90 (bottom number)  Stomach pain under your ribs in the middle or right side Headaches or seeing spots Feeling nauseated or throwing up Swelling in face and hands  Zone 3: EMERGENCY  Seek immediate medical care if you have any of the following:  BP reading is greater than160 (top number) or greater than 110 (bottom number) Severe headaches not improving with Tylenol Serious difficulty catching your breath Any worsening symptoms from  Zone 2     Second Trimester of Pregnancy The second trimester is from week 14 through week 27 (months 4 through 6). The second trimester is often a time when you feel your best. Your body has adjusted to being pregnant, and you begin to feel better physically. Usually, morning sickness has lessened or quit completely, you may have more energy, and you may have an increase in appetite. The second trimester is also a time when the fetus is growing rapidly. At the end of the sixth month, the fetus is about 9 inches long and weighs about 1 pounds. You will likely begin to feel the baby move (quickening) between 16 and 20 weeks of pregnancy. Body changes during your second trimester Your body continues to go through many changes during your second trimester. The changes vary from woman to woman. Your weight will continue to increase. You will notice your lower abdomen bulging out. You may begin to get stretch marks on your hips, abdomen, and breasts. You may develop headaches that can be relieved by medicines. The medicines should be approved by your health care provider. You may urinate more often because the fetus is pressing on your bladder. You may develop or continue to have heartburn as a result of your pregnancy. You may develop constipation because certain hormones are causing the muscles that push waste through your intestines to slow down. You may develop hemorrhoids or swollen, bulging veins (varicose veins). You may have back pain. This is caused by: Weight gain. Pregnancy hormones that are relaxing the joints in your pelvis. A shift in weight and the muscles that support your balance. Your breasts will continue to grow and they will continue to become tender. Your gums may bleed and may be sensitive to brushing and flossing. Dark spots or blotches (chloasma, mask of pregnancy) may develop on your face. This will likely fade after the baby is born. A dark line from your belly button to  the pubic area (linea nigra) may appear. This will likely fade after the baby is born. You may have changes in your hair. These can include thickening of your hair, rapid growth, and changes in texture. Some women also have hair loss during or after pregnancy, or hair that feels dry or thin. Your hair will most likely return to normal after your baby is born.  What to expect at prenatal visits During a routine prenatal visit: You will be weighed to make sure you and the fetus are growing normally. Your blood pressure will be taken. Your abdomen will be measured to track your baby's growth. The fetal heartbeat will be listened to. Any test results from the previous visit will be discussed.  Your health care provider may ask you: How you are feeling. If you are feeling the baby move. If you have had any abnormal symptoms, such as leaking fluid, bleeding, severe headaches, or abdominal cramping. If you are using any tobacco products, including cigarettes, chewing tobacco, and electronic cigarettes. If you have any questions.  Other tests that may be performed during  your second trimester include: Blood tests that check for: Low iron levels (anemia). High blood sugar that affects pregnant women (gestational diabetes) between 54 and 28 weeks. Rh antibodies. This is to check for a protein on red blood cells (Rh factor). Urine tests to check for infections, diabetes, or protein in the urine. An ultrasound to confirm the proper growth and development of the baby. An amniocentesis to check for possible genetic problems. Fetal screens for spina bifida and Down syndrome. HIV (human immunodeficiency virus) testing. Routine prenatal testing includes screening for HIV, unless you choose not to have this test.  Follow these instructions at home: Medicines Follow your health care provider's instructions regarding medicine use. Specific medicines may be either safe or unsafe to take during  pregnancy. Take a prenatal vitamin that contains at least 600 micrograms (mcg) of folic acid. If you develop constipation, try taking a stool softener if your health care provider approves. Eating and drinking Eat a balanced diet that includes fresh fruits and vegetables, whole grains, good sources of protein such as meat, eggs, or tofu, and low-fat dairy. Your health care provider will help you determine the amount of weight gain that is right for you. Avoid raw meat and uncooked cheese. These carry germs that can cause birth defects in the baby. If you have low calcium intake from food, talk to your health care provider about whether you should take a daily calcium supplement. Limit foods that are high in fat and processed sugars, such as fried and sweet foods. To prevent constipation: Drink enough fluid to keep your urine clear or pale yellow. Eat foods that are high in fiber, such as fresh fruits and vegetables, whole grains, and beans. Activity Exercise only as directed by your health care provider. Most women can continue their usual exercise routine during pregnancy. Try to exercise for 30 minutes at least 5 days a week. Stop exercising if you experience uterine contractions. Avoid heavy lifting, wear low heel shoes, and practice good posture. A sexual relationship may be continued unless your health care provider directs you otherwise. Relieving pain and discomfort Wear a good support bra to prevent discomfort from breast tenderness. Take warm sitz baths to soothe any pain or discomfort caused by hemorrhoids. Use hemorrhoid cream if your health care provider approves. Rest with your legs elevated if you have leg cramps or low back pain. If you develop varicose veins, wear support hose. Elevate your feet for 15 minutes, 3-4 times a day. Limit salt in your diet. Prenatal Care Write down your questions. Take them to your prenatal visits. Keep all your prenatal visits as told by your health  care provider. This is important. Safety Wear your seat belt at all times when driving. Make a list of emergency phone numbers, including numbers for family, friends, the hospital, and police and fire departments. General instructions Ask your health care provider for a referral to a local prenatal education class. Begin classes no later than the beginning of month 6 of your pregnancy. Ask for help if you have counseling or nutritional needs during pregnancy. Your health care provider can offer advice or refer you to specialists for help with various needs. Do not use hot tubs, steam rooms, or saunas. Do not douche or use tampons or scented sanitary pads. Do not cross your legs for long periods of time. Avoid cat litter boxes and soil used by cats. These carry germs that can cause birth defects in the baby and possibly loss of the  fetus by miscarriage or stillbirth. Avoid all smoking, herbs, alcohol, and unprescribed drugs. Chemicals in these products can affect the formation and growth of the baby. Do not use any products that contain nicotine or tobacco, such as cigarettes and e-cigarettes. If you need help quitting, ask your health care provider. Visit your dentist if you have not gone yet during your pregnancy. Use a soft toothbrush to brush your teeth and be gentle when you floss. Contact a health care provider if: You have dizziness. You have mild pelvic cramps, pelvic pressure, or nagging pain in the abdominal area. You have persistent nausea, vomiting, or diarrhea. You have a bad smelling vaginal discharge. You have pain when you urinate. Get help right away if: You have a fever. You are leaking fluid from your vagina. You have spotting or bleeding from your vagina. You have severe abdominal cramping or pain. You have rapid weight gain or weight loss. You have shortness of breath with chest pain. You notice sudden or extreme swelling of your face, hands, ankles, feet, or legs. You  have not felt your baby move in over an hour. You have severe headaches that do not go away when you take medicine. You have vision changes. Summary The second trimester is from week 14 through week 27 (months 4 through 6). It is also a time when the fetus is growing rapidly. Your body goes through many changes during pregnancy. The changes vary from woman to woman. Avoid all smoking, herbs, alcohol, and unprescribed drugs. These chemicals affect the formation and growth your baby. Do not use any tobacco products, such as cigarettes, chewing tobacco, and e-cigarettes. If you need help quitting, ask your health care provider. Contact your health care provider if you have any questions. Keep all prenatal visits as told by your health care provider. This is important. This information is not intended to replace advice given to you by your health care provider. Make sure you discuss any questions you have with your health care provider. Document Released: 03/15/2001 Document Revised: 08/27/2015 Document Reviewed: 05/22/2012 Elsevier Interactive Patient Education  2017 [employer].

## 2023-12-15 ENCOUNTER — Encounter: Payer: Self-pay | Admitting: *Deleted

## 2024-01-08 ENCOUNTER — Ambulatory Visit: Admitting: Obstetrics & Gynecology

## 2024-01-08 ENCOUNTER — Encounter: Payer: Self-pay | Admitting: Obstetrics & Gynecology

## 2024-01-08 ENCOUNTER — Ambulatory Visit

## 2024-01-08 VITALS — BP 119/87 | HR 105 | Wt 220.6 lb

## 2024-01-08 DIAGNOSIS — O0990 Supervision of high risk pregnancy, unspecified, unspecified trimester: Secondary | ICD-10-CM

## 2024-01-08 DIAGNOSIS — O10012 Pre-existing essential hypertension complicating pregnancy, second trimester: Secondary | ICD-10-CM

## 2024-01-08 DIAGNOSIS — E063 Autoimmune thyroiditis: Secondary | ICD-10-CM | POA: Diagnosis not present

## 2024-01-08 DIAGNOSIS — O0991 Supervision of high risk pregnancy, unspecified, first trimester: Secondary | ICD-10-CM | POA: Diagnosis not present

## 2024-01-08 DIAGNOSIS — O0992 Supervision of high risk pregnancy, unspecified, second trimester: Secondary | ICD-10-CM

## 2024-01-08 DIAGNOSIS — Z363 Encounter for antenatal screening for malformations: Secondary | ICD-10-CM

## 2024-01-08 DIAGNOSIS — Z3A20 20 weeks gestation of pregnancy: Secondary | ICD-10-CM

## 2024-01-08 DIAGNOSIS — O10919 Unspecified pre-existing hypertension complicating pregnancy, unspecified trimester: Secondary | ICD-10-CM

## 2024-01-08 DIAGNOSIS — O10912 Unspecified pre-existing hypertension complicating pregnancy, second trimester: Secondary | ICD-10-CM | POA: Diagnosis not present

## 2024-01-08 DIAGNOSIS — O09522 Supervision of elderly multigravida, second trimester: Secondary | ICD-10-CM

## 2024-01-08 DIAGNOSIS — O3506X0 Maternal care for (suspected) central nervous system malformation or damage in fetus, hydrocephaly, not applicable or unspecified: Secondary | ICD-10-CM

## 2024-01-08 DIAGNOSIS — O99282 Endocrine, nutritional and metabolic diseases complicating pregnancy, second trimester: Secondary | ICD-10-CM | POA: Diagnosis not present

## 2024-01-08 DIAGNOSIS — O35HXX0 Maternal care for other (suspected) fetal abnormality and damage, fetal lower extremities anomalies, not applicable or unspecified: Secondary | ICD-10-CM | POA: Diagnosis not present

## 2024-01-08 NOTE — Addendum Note (Signed)
 Addended by: [name] RUTHERFORD HERO on: 01/08/2024 03:43 PM   Modules accepted: Orders

## 2024-01-08 NOTE — Progress Notes (Addendum)
 US  20+3 wks,breech,posterior placenta gr 0,CX 3.9 cm,normal ovaries,FHR 153 bpm,SVP of fluid 4.3 cm,EFW 296 g 9%,AC 29%,FL 4%,ventricular megaly,thickened NT, anatomy complete

## 2024-01-08 NOTE — Progress Notes (Signed)
 HIGH-RISK PREGNANCY VISIT Patient name: FREDDI FORSTER MRN [id number]  Date of birth: 18-Feb-1986 Chief Complaint:   Routine Prenatal Visit (Anatomy scan)  History of Present Illness:   TYSHEA IMEL is a 38 y.o. [id number] female at [redacted]w[redacted]d with an Estimated Date of Delivery: 05/24/24 being seen today for ongoing management of a high-risk pregnancy complicated by:  -AMA -Chronic HTN- on Norvasc  -Hypothyroidism  Today she reports no complaints.   Contractions: Not present. Vag. Bleeding: None.  Movement: Present. denies leaking of fluid.      11/16/2023    3:23 PM 09/12/2023    9:17 AM 06/15/2023   11:40 AM 08/24/2022    1:16 PM 04/27/2022   10:32 AM  Depression screen PHQ 2/9  Decreased Interest 0 0 0 0 1  Down, Depressed, Hopeless 0 0 1 0 0  PHQ - 2 Score 0 0 1 0 1  Altered sleeping 1 1 3 1 1   Tired, decreased energy 1 1 3 1 3   Change in appetite 0 1 3 1 1   Feeling bad or failure about yourself  0 0 1 0 0  Trouble concentrating 0 1 2 0 2  Moving slowly or fidgety/restless 0 0 2 0 0  Suicidal thoughts 0 0 0 0 0  PHQ-9 Score 2 4 15 3 8   Difficult doing work/chores  Not difficult at all  Not difficult at all      Current Outpatient Medications  Medication Instructions   Acetaminophen (TYLENOL PO) Take by mouth.   albuterol  (VENTOLIN  HFA) 108 (90 Base) MCG/ACT inhaler INHALE TWO PUFFS INTO THE LUNGS EVERY FOUR HOURS AS NEEDED FOR WHEEZING   amLODipine  (NORVASC ) 5 MG tablet TAKE ONE TABLET (5MG  TOTAL) BY MOUTH DAILY   aspirin  EC 162 mg, Oral, Daily, Swallow whole.   Cetirizine HCl 10 mg, Daily   Cholecalciferol (VITAMIN D ) 125 MCG (5000 UT) CAPS 1 capsule, Daily   Coenzyme Q10 (CO Q 10 PO) Take by mouth.   docusate sodium (COLACE) 100 mg, 2 times daily   folic acid (FOLVITE) 1 mg, Daily   levothyroxine  (SYNTHROID ) 25 MCG tablet TAKE ONE (1) TABLET BY MOUTH EVERY DAY BEFORE BREAKFAST   magnesium 250 mg,  Once   Menaquinone-7 (K2 PO) 100 mcg, Daily   montelukast  (SINGULAIR )  10 mg, Oral, Daily at bedtime     Review of Systems:   Pertinent items are noted in HPI Denies abnormal vaginal discharge w/ itching/odor/irritation, headaches, visual changes, shortness of breath, chest pain, abdominal pain, severe nausea/vomiting, or problems with urination or bowel movements unless otherwise stated above. Pertinent History Reviewed:  Reviewed past medical,surgical, social, obstetrical and family history.  Reviewed problem list, medications and allergies. Physical Assessment:   Vitals:   01/08/24 1244 01/08/24 1245  BP: (!) 148/89 119/87  Pulse:  (!) 105  Weight: 220 lb 9.6 oz (100.1 kg)   Body mass index is 37.87 kg/m.           Physical Examination:   General appearance: alert, well appearing, and in no distress  Mental status: normal mood, behavior, speech, dress, motor activity, and thought processes  Skin: warm & dry   Extremities: Edema: None    Cardiovascular: normal heart rate noted  Respiratory: normal respiratory effort, no distress  Abdomen: gravid, soft, non-tender  Pelvic: Cervical exam deferred         Fetal Status:     Movement: Present    Fetal Surveillance Testing today: breech,posterior placenta gr 0,CX 3.9  cm,normal ovaries,FHR 153 bpm,SVP of fluid 4.3 cm,EFW 296 g 9%,AC 29%,FL 4%,ventricular megaly,thickened NT, anatomy complete    Chaperone: N/A    No results found for this or any previous visit (from the past 24 hours).   Assessment & Plan:  High-risk pregnancy: [id number] at [redacted]w[redacted]d with an Estimated Date of Delivery: 05/24/24   1) Fetal ventriculomegaly, short femur, early FGR -recommended genetic testing (NIPS) -pt to see MFM for follow up detailed anatomy -discussed that etiology range from incidental finding to something more concerning.  Further discussion/management per MFM  2) Chronic HTN   -continue current meds -growth every 4 wks  3) Hypothyroidism -recheck lab work today  Meds: No orders of the defined types were  placed in this encounter.   Labs/procedures today: TSH, NIPs  Treatment Plan:  routine OB care and as outlined above  Reviewed: Preterm labor symptoms and general obstetric precautions including but not limited to vaginal bleeding, contractions, leaking of fluid and fetal movement were reviewed in detail with the patient.  All questions were answered. Pt has home bp cuff. Check bp weekly, let us  know if >140/90.   Follow-up: Return in about 4 weeks (around 02/05/2024) for HROB visit and needs MFM/detailed anatomy scan in 4wks.   Future Appointments  Date Time Provider Department Center  01/08/2024  1:30 PM Marilynn Nest, DO [facility]    Orders Placed This Encounter  Procedures   US  MFM OB DETAIL +14 WK   TSH    Anessia Oakland, DO Attending Obstetrician & Gynecologist, [facility] for [employer], [facility]

## 2024-01-09 ENCOUNTER — Encounter: Payer: Self-pay | Admitting: Obstetrics & Gynecology

## 2024-01-09 DIAGNOSIS — E063 Autoimmune thyroiditis: Secondary | ICD-10-CM | POA: Diagnosis not present

## 2024-01-09 NOTE — Addendum Note (Signed)
 Addended by: [name] RUTHERFORD HERO on: 01/09/2024 12:31 PM   Modules accepted: Orders

## 2024-01-10 ENCOUNTER — Ambulatory Visit: Payer: Self-pay | Admitting: Obstetrics & Gynecology

## 2024-01-10 LAB — TSH: TSH: 0.544 u[IU]/mL (ref 0.450–4.500)

## 2024-01-17 LAB — PANORAMA PRENATAL TEST FULL PANEL:PANORAMA TEST PLUS 5 ADDITIONAL MICRODELETIONS
FETAL FRACTION: 14.7
REPORT SUMMARY: HIGH — AB
TRISOMY 21 RESULT TEXT: HIGH — AB

## 2024-02-02 DIAGNOSIS — O285 Abnormal chromosomal and genetic finding on antenatal screening of mother: Secondary | ICD-10-CM | POA: Insufficient documentation

## 2024-02-16 ENCOUNTER — Ambulatory Visit: Attending: Obstetrics & Gynecology

## 2024-02-16 ENCOUNTER — Encounter: Payer: Self-pay | Admitting: Obstetrics & Gynecology

## 2024-02-16 ENCOUNTER — Ambulatory Visit (INDEPENDENT_AMBULATORY_CARE_PROVIDER_SITE_OTHER): Admitting: Obstetrics & Gynecology

## 2024-02-16 ENCOUNTER — Ambulatory Visit (HOSPITAL_BASED_OUTPATIENT_CLINIC_OR_DEPARTMENT_OTHER): Admitting: Obstetrics

## 2024-02-16 ENCOUNTER — Other Ambulatory Visit: Payer: Self-pay | Admitting: *Deleted

## 2024-02-16 VITALS — BP 128/81 | HR 136

## 2024-02-16 VITALS — BP 130/80 | HR 86 | Wt 223.2 lb

## 2024-02-16 DIAGNOSIS — Z3A30 30 weeks gestation of pregnancy: Secondary | ICD-10-CM

## 2024-02-16 DIAGNOSIS — O3506X0 Maternal care for (suspected) central nervous system malformation or damage in fetus, hydrocephaly, not applicable or unspecified: Secondary | ICD-10-CM | POA: Diagnosis not present

## 2024-02-16 DIAGNOSIS — O3513X0 Maternal care for (suspected) chromosomal abnormality in fetus, trisomy 21, not applicable or unspecified: Secondary | ICD-10-CM | POA: Diagnosis not present

## 2024-02-16 DIAGNOSIS — O10912 Unspecified pre-existing hypertension complicating pregnancy, second trimester: Secondary | ICD-10-CM | POA: Diagnosis not present

## 2024-02-16 DIAGNOSIS — O3663X0 Maternal care for excessive fetal growth, third trimester, not applicable or unspecified: Secondary | ICD-10-CM

## 2024-02-16 DIAGNOSIS — O10919 Unspecified pre-existing hypertension complicating pregnancy, unspecified trimester: Secondary | ICD-10-CM

## 2024-02-16 DIAGNOSIS — O0990 Supervision of high risk pregnancy, unspecified, unspecified trimester: Secondary | ICD-10-CM | POA: Insufficient documentation

## 2024-02-16 DIAGNOSIS — E669 Obesity, unspecified: Secondary | ICD-10-CM

## 2024-02-16 DIAGNOSIS — Z3A26 26 weeks gestation of pregnancy: Secondary | ICD-10-CM | POA: Insufficient documentation

## 2024-02-16 DIAGNOSIS — E063 Autoimmune thyroiditis: Secondary | ICD-10-CM

## 2024-02-16 DIAGNOSIS — O280 Abnormal hematological finding on antenatal screening of mother: Secondary | ICD-10-CM

## 2024-02-16 DIAGNOSIS — O99213 Obesity complicating pregnancy, third trimester: Secondary | ICD-10-CM

## 2024-02-16 DIAGNOSIS — O99212 Obesity complicating pregnancy, second trimester: Secondary | ICD-10-CM | POA: Insufficient documentation

## 2024-02-16 DIAGNOSIS — O285 Abnormal chromosomal and genetic finding on antenatal screening of mother: Secondary | ICD-10-CM

## 2024-02-16 DIAGNOSIS — O35HXX0 Maternal care for other (suspected) fetal abnormality and damage, fetal lower extremities anomalies, not applicable or unspecified: Secondary | ICD-10-CM | POA: Insufficient documentation

## 2024-02-16 DIAGNOSIS — O09522 Supervision of elderly multigravida, second trimester: Secondary | ICD-10-CM

## 2024-02-16 DIAGNOSIS — Z362 Encounter for other antenatal screening follow-up: Secondary | ICD-10-CM

## 2024-02-16 DIAGNOSIS — O0992 Supervision of high risk pregnancy, unspecified, second trimester: Secondary | ICD-10-CM

## 2024-02-16 NOTE — Progress Notes (Signed)
 MFM Consult Note  Hailey Holden is currently at 26 weeks and 0 days.  She was seen due to advanced maternal age (38 years old), history of hypothyroidism, maternal obesity with a BMI of 38, and chronic hypertension treated with Norvasc .  Her cell free DNA test indicated a high risk for trisomy 12.  The cell free DNA test indicated a low risk for trisomy 18 and 13.  A female fetus is predicted.  Sonographic findings Single intrauterine pregnancy at 26w 0d  Fetal cardiac activity:  Observed and appears normal. Presentation: Cephalic. On today's exam, mild bilateral ventriculomegaly was noted in the fetal head and a VSD was noted in the fetal heart. A possible absent nasal bone was also noted. Fetal biometry shows the estimated fetal weight of 1 pound 13 ounces which measures at the 23rd percentile.  Amniotic fluid: Within normal limits.  MVP: 6.21 cm. Placenta: Posterior.  The patient was informed that anomalies may be missed due to technical limitations. If the fetus is in a suboptimal position or maternal habitus is increased, visualization of the fetus in the maternal uterus may be impaired.  Cell free DNA test indicating a high risk for trisomy 21 with multiple sonographic markers associated with Down syndrome  The patient was advised that the multiple sonographic markers associated with Down syndrome noted today along with her positive cell free DNA test, indicates that her fetus is at extremely high risk for having Down syndrome.  The availability of an amniocentesis for definitive prenatal diagnosis of Down syndrome was discussed.  She declined to have the amniocentesis today as knowing if the fetus has Down syndrome will not change the outcome or management of her pregnancy.  She was advised that I suspect that the fetus has a ventricular septal defect (VSD).  The appearance of the four-chamber heart did not look like a typical AV canal defect.  However, she understands the pediatric  cardiologist may report that the fetus has an AV canal defect.    Due to the suspected cardiac defect, she was referred to [facility] pediatric cardiology for a fetal echocardiogram.  She was advised that mild ventriculomegaly (1.2 cm dilated) is usually associated with a good neonatal outcome.  However, the ventricles may increase in size later in her pregnancy.  She should continue to be followed with ultrasounds to assess the size of the lateral ventricles.  Chronic hypertension  She was advised to continue taking amlodipine  for blood pressure control throughout her pregnancy.    The increased risk of superimposed preeclampsia due to chronic hypertension was discussed.    She should continue taking a daily baby aspirin  for preeclampsia prophylaxis.  Due to chronic hypertension and a fetus with probable Down syndrome, weekly fetal testing should be started at 32 weeks and continued until delivery.  She should also continue to be followed with monthly growth ultrasounds.  Delivery should be considered at between 37 to 39 weeks depending on her blood pressures and the fetal status.  Delivery prior to 37 weeks may be necessary should she develop preeclampsia.  The patient stated that she would prefer to have all of her future ultrasounds and weekly fetal testing performed at [facility].    No further exams were scheduled in our office.    The patient and her husband stated that all of their questions were answered today.  A total of 45 minutes was spent counseling and coordinating the care for this patient.  Greater than 50% of the time was  spent in direct face-to-face contact.

## 2024-02-16 NOTE — Progress Notes (Addendum)
 HIGH-RISK PREGNANCY VISIT Patient name: Hailey Holden MRN [id number]  Date of birth: 07-Dec-1985 Chief Complaint:   Routine Prenatal Visit  History of Present Illness:   Hailey Holden is a 38 y.o. [id number] female at [redacted]w[redacted]d with an Estimated Date of Delivery: 05/24/24 being seen today for ongoing management of a high-risk pregnancy complicated by:  -high risk T21 -Chronic HTN- on Norvasc  daily -Hypothyroidism-   Today she reports no complaints.   Contractions: Not present. Vag. Bleeding: None.  Movement: Present. denies leaking of fluid.      11/16/2023    3:23 PM 09/12/2023    9:17 AM 06/15/2023   11:40 AM 08/24/2022    1:16 PM 04/27/2022   10:32 AM  Depression screen PHQ 2/9  Decreased Interest 0 0 0 0 1  Down, Depressed, Hopeless 0 0 1 0 0  PHQ - 2 Score 0 0 1 0 1  Altered sleeping 1 1 3 1 1   Tired, decreased energy 1 1 3 1 3   Change in appetite 0 1 3 1 1   Feeling bad or failure about yourself  0 0 1 0 0  Trouble concentrating 0 1 2 0 2  Moving slowly or fidgety/restless 0 0 2 0 0  Suicidal thoughts 0 0 0 0 0  PHQ-9 Score 2  4  15  3  8    Difficult doing work/chores  Not difficult at all  Not difficult at all      Data saved with a previous flowsheet row definition     Current Outpatient Medications  Medication Instructions   Acetaminophen (TYLENOL PO) Take by mouth.   albuterol  (VENTOLIN  HFA) 108 (90 Base) MCG/ACT inhaler INHALE TWO PUFFS INTO THE LUNGS EVERY FOUR HOURS AS NEEDED FOR WHEEZING   amLODipine  (NORVASC ) 5 MG tablet TAKE ONE TABLET (5MG  TOTAL) BY MOUTH DAILY   aspirin  EC 162 mg, Oral, Daily, Swallow whole.   Cetirizine HCl 10 mg, Daily   Cholecalciferol (VITAMIN D ) 125 MCG (5000 UT) CAPS 1 capsule, Daily   Coenzyme Q10 (CO Q 10 PO) Take by mouth.   docusate sodium (COLACE) 100 mg, 2 times daily   folic acid (FOLVITE) 1 mg, Daily   levothyroxine  (SYNTHROID ) 25 MCG tablet TAKE ONE (1) TABLET BY MOUTH EVERY DAY BEFORE BREAKFAST   Menaquinone-7 (K2 PO) 100  mcg, Daily     Review of Systems:   Pertinent items are noted in HPI Denies abnormal vaginal discharge w/ itching/odor/irritation, headaches, visual changes, shortness of breath, chest pain, abdominal pain, severe nausea/vomiting, or problems with urination or bowel movements unless otherwise stated above. Pertinent History Reviewed:  Reviewed past medical,surgical, social, obstetrical and family history.  Reviewed problem list, medications and allergies. Physical Assessment:   Vitals:   02/16/24 1047  BP: 130/80  Pulse: 86  Weight: 223 lb 3.2 oz (101.2 kg)  Body mass index is 38.31 kg/m.           Physical Examination:   General appearance: alert, well appearing, and in no distress  Mental status: normal mood, behavior, speech, dress, motor activity, and thought processes  Skin: warm & dry   Extremities:      Cardiovascular: normal heart rate noted  Respiratory: normal respiratory effort, no distress  Abdomen: gravid, soft, non-tender  Pelvic: Cervical exam deferred         Fetal Status:     Movement: Present    Fetal Surveillance Testing today: MFM detailed anatomy scan- see separate note   Chaperone:  N/A    No results found for this or any previous visit (from the past 24 hours).   Assessment & Plan:  High-risk pregnancy: [id number] at [redacted]w[redacted]d with an Estimated Date of Delivery: 05/24/24   1) high risk T21 -scheduled for fetal ECHO -continue growth q 4wks  2) Chronic HTN -continue current mends -antepartum testing @ 32wks -reviewed IOL ~ 37wk  3) Hypothyroidism No change in current meds Last checked 10/7  Meds: No orders of the defined types were placed in this encounter.   Labs/procedures today: doppler, US  with MFM.  PN2 to be scheduled next available  Treatment Plan:  routine OB care and as outlined above  Reviewed: Preterm labor symptoms and general obstetric precautions including but not limited to vaginal bleeding, contractions, leaking of fluid and  fetal movement were reviewed in detail with the patient.  All questions were answered. Pt has home bp cuff. Check bp weekly, let us  know if >160/110  Follow-up: Return in about 4 weeks (around 03/15/2024) for schedule PN2 next available, 4wk HROB/growth, in 6wk BPP/NST twice weekly.   Future Appointments  Date Time Provider Department Center  03/15/2024  9:15 AM [facility] - [facility] US  [facility] None  03/15/2024 10:10 AM Jayne Vonn DEL, MD [facility]    No orders of the defined types were placed in this encounter.   Giuseppina Quinones, DO Attending Obstetrician & Gynecologist, [facility] for [employer], [facility]

## 2024-02-27 ENCOUNTER — Other Ambulatory Visit

## 2024-02-27 DIAGNOSIS — O0990 Supervision of high risk pregnancy, unspecified, unspecified trimester: Secondary | ICD-10-CM | POA: Diagnosis not present

## 2024-02-27 DIAGNOSIS — Z131 Encounter for screening for diabetes mellitus: Secondary | ICD-10-CM | POA: Diagnosis not present

## 2024-02-27 DIAGNOSIS — E063 Autoimmune thyroiditis: Secondary | ICD-10-CM

## 2024-02-27 DIAGNOSIS — Z3A27 27 weeks gestation of pregnancy: Secondary | ICD-10-CM

## 2024-02-28 ENCOUNTER — Encounter: Payer: Self-pay | Admitting: *Deleted

## 2024-02-28 ENCOUNTER — Other Ambulatory Visit: Payer: Self-pay | Admitting: Obstetrics & Gynecology

## 2024-02-28 DIAGNOSIS — O24410 Gestational diabetes mellitus in pregnancy, diet controlled: Secondary | ICD-10-CM

## 2024-02-28 DIAGNOSIS — O24419 Gestational diabetes mellitus in pregnancy, unspecified control: Secondary | ICD-10-CM | POA: Insufficient documentation

## 2024-02-28 LAB — HIV ANTIBODY (ROUTINE TESTING W REFLEX): HIV Screen 4th Generation wRfx: NONREACTIVE

## 2024-02-28 LAB — CBC
Hematocrit: 36.5 % (ref 34.0–46.6)
Hemoglobin: 11.8 g/dL (ref 11.1–15.9)
MCH: 26.7 pg (ref 26.6–33.0)
MCHC: 32.3 g/dL (ref 31.5–35.7)
MCV: 83 fL (ref 79–97)
Platelets: 371 x10E3/uL (ref 150–450)
RBC: 4.42 x10E6/uL (ref 3.77–5.28)
RDW: 13.2 % (ref 11.7–15.4)
WBC: 9 x10E3/uL (ref 3.4–10.8)

## 2024-02-28 LAB — SYPHILIS: RPR W/REFLEX TO RPR TITER AND TREPONEMAL ANTIBODIES, TRADITIONAL SCREENING AND DIAGNOSIS ALGORITHM: RPR Ser Ql: NONREACTIVE

## 2024-02-28 LAB — ANTIBODY SCREEN: Antibody Screen: NEGATIVE

## 2024-02-28 LAB — GLUCOSE TOLERANCE, 2 HOURS W/ 1HR
Glucose, 1 hour: 207 mg/dL — ABNORMAL HIGH (ref 70–179)
Glucose, 2 hour: 142 mg/dL (ref 70–152)
Glucose, Fasting: 108 mg/dL — ABNORMAL HIGH (ref 70–91)

## 2024-02-28 LAB — TSH: TSH: 0.729 u[IU]/mL (ref 0.450–4.500)

## 2024-02-28 MED ORDER — ACCU-CHEK GUIDE ME W/DEVICE KIT: 1.0000 | PACK | Freq: Four times a day (QID) | 0 refills | Status: DC

## 2024-02-28 MED ORDER — ACCU-CHEK SOFTCLIX LANCETS MISC: 12 refills | Status: DC

## 2024-02-28 MED ORDER — GLUCOSE BLOOD VI STRP: ORAL_STRIP | 12 refills | Status: DC

## 2024-03-05 DIAGNOSIS — O3513X0 Maternal care for (suspected) chromosomal abnormality in fetus, trisomy 21, not applicable or unspecified: Secondary | ICD-10-CM | POA: Diagnosis not present

## 2024-03-05 DIAGNOSIS — O35BXX0 Maternal care for other (suspected) fetal abnormality and damage, fetal cardiac anomalies, not applicable or unspecified: Secondary | ICD-10-CM | POA: Diagnosis not present

## 2024-03-08 ENCOUNTER — Encounter: Payer: Self-pay | Admitting: Obstetrics & Gynecology

## 2024-03-08 DIAGNOSIS — O35BXX0 Maternal care for other (suspected) fetal abnormality and damage, fetal cardiac anomalies, not applicable or unspecified: Secondary | ICD-10-CM | POA: Insufficient documentation

## 2024-03-13 ENCOUNTER — Encounter: Admitting: Nutrition

## 2024-03-13 ENCOUNTER — Encounter: Payer: Self-pay | Admitting: Nutrition

## 2024-03-13 VITALS — Ht 63.0 in | Wt 222.0 lb

## 2024-03-13 DIAGNOSIS — O24410 Gestational diabetes mellitus in pregnancy, diet controlled: Secondary | ICD-10-CM | POA: Diagnosis present

## 2024-03-13 NOTE — Progress Notes (Signed)
 Begin:10:30 am  End 1132 am  Patient was seen on 03-13-24 for [facility] self[facility]managementat [facility] [place]. The following learning objectives were met by the patient during this course:  States the definition of Gestational Diabetes States why dietary management is important in controlling blood glucose Describes the effects each nutrient has on blood glucose levels Demonstrates ability to create a balanced meal plan Demonstrates carbohydrate counting  States when to check blood glucose levels Demonstrates proper blood glucose monitoring techniques States the effect of stress and exercise on blood glucose levels States the importance of limiting caffeine and abstaining from alcohol and smoking  She has been drinking sodas and eating heavier carb and fatty meals. Admits she needs to change and is willing to focus on more whole plant based foods.  Blood glucose reading: 107 this am. Goals  Cut out sodas 100% Eat three meals and 3 snacks per guidelines discussed Drink 3 bottles of water daily. Test blood sugars 4 times per day as instructed and record on sheet Walk 15 minutes three times per week.  Patient instructed to monitor glucose levels: FBS: 60 - <95 mg/dl 2 hour: <879 mg/dl. *Patient received handouts: Nutrition Diabetes and Pregnancy Carbohydrate Counting List  Patient will be seen for follow-up 1 week.

## 2024-03-13 NOTE — Patient Instructions (Addendum)
 Goals  Cut out sodas Eat three meals and 3 snacks per guidelines discussed Drink 3 bottles of water Test blood sugars 4 times per day and record on sheet Walk 15 minutes three times per week.  Patient instructed to monitor glucose levels: FBS: 60 - <95 mg/dl 2 hour: <879 mg/dl.

## 2024-03-14 ENCOUNTER — Other Ambulatory Visit: Payer: Self-pay | Admitting: Obstetrics & Gynecology

## 2024-03-14 DIAGNOSIS — O285 Abnormal chromosomal and genetic finding on antenatal screening of mother: Secondary | ICD-10-CM

## 2024-03-14 DIAGNOSIS — O99212 Obesity complicating pregnancy, second trimester: Secondary | ICD-10-CM

## 2024-03-14 DIAGNOSIS — O10919 Unspecified pre-existing hypertension complicating pregnancy, unspecified trimester: Secondary | ICD-10-CM

## 2024-03-14 DIAGNOSIS — O24410 Gestational diabetes mellitus in pregnancy, diet controlled: Secondary | ICD-10-CM

## 2024-03-15 ENCOUNTER — Ambulatory Visit: Admitting: Obstetrics & Gynecology

## 2024-03-15 ENCOUNTER — Ambulatory Visit (INDEPENDENT_AMBULATORY_CARE_PROVIDER_SITE_OTHER)

## 2024-03-15 ENCOUNTER — Other Ambulatory Visit

## 2024-03-15 ENCOUNTER — Encounter: Payer: Self-pay | Admitting: Obstetrics & Gynecology

## 2024-03-15 VITALS — BP 130/86 | HR 116 | Wt 222.4 lb

## 2024-03-15 DIAGNOSIS — Z1389 Encounter for screening for other disorder: Secondary | ICD-10-CM

## 2024-03-15 DIAGNOSIS — Z3A29 29 weeks gestation of pregnancy: Secondary | ICD-10-CM | POA: Diagnosis not present

## 2024-03-15 DIAGNOSIS — O3506X0 Maternal care for (suspected) central nervous system malformation or damage in fetus, hydrocephaly, not applicable or unspecified: Secondary | ICD-10-CM

## 2024-03-15 DIAGNOSIS — O3509X0 Maternal care for (suspected) other central nervous system malformation or damage in fetus, not applicable or unspecified: Secondary | ICD-10-CM | POA: Diagnosis not present

## 2024-03-15 DIAGNOSIS — O0990 Supervision of high risk pregnancy, unspecified, unspecified trimester: Secondary | ICD-10-CM

## 2024-03-15 DIAGNOSIS — Z3A30 30 weeks gestation of pregnancy: Secondary | ICD-10-CM

## 2024-03-15 DIAGNOSIS — O24410 Gestational diabetes mellitus in pregnancy, diet controlled: Secondary | ICD-10-CM

## 2024-03-15 DIAGNOSIS — O285 Abnormal chromosomal and genetic finding on antenatal screening of mother: Secondary | ICD-10-CM

## 2024-03-15 DIAGNOSIS — O09523 Supervision of elderly multigravida, third trimester: Secondary | ICD-10-CM

## 2024-03-15 DIAGNOSIS — O35BXX0 Maternal care for other (suspected) fetal abnormality and damage, fetal cardiac anomalies, not applicable or unspecified: Secondary | ICD-10-CM

## 2024-03-15 DIAGNOSIS — Z331 Pregnant state, incidental: Secondary | ICD-10-CM

## 2024-03-15 DIAGNOSIS — O35EXX0 Maternal care for other (suspected) fetal abnormality and damage, fetal genitourinary anomalies, not applicable or unspecified: Secondary | ICD-10-CM

## 2024-03-15 DIAGNOSIS — O99212 Obesity complicating pregnancy, second trimester: Secondary | ICD-10-CM

## 2024-03-15 DIAGNOSIS — O10919 Unspecified pre-existing hypertension complicating pregnancy, unspecified trimester: Secondary | ICD-10-CM

## 2024-03-15 DIAGNOSIS — Q909 Down syndrome, unspecified: Secondary | ICD-10-CM

## 2024-03-15 DIAGNOSIS — O24414 Gestational diabetes mellitus in pregnancy, insulin controlled: Secondary | ICD-10-CM

## 2024-03-15 LAB — POCT URINALYSIS DIPSTICK OB
Blood, UA: NEGATIVE
Glucose, UA: NEGATIVE
Nitrite, UA: NEGATIVE

## 2024-03-15 NOTE — Progress Notes (Signed)
 "   HIGH-RISK PREGNANCY VISIT Patient name: Hailey Holden MRN [id number]  Date of birth: 02-Mar-1986 Chief Complaint:   Routine Prenatal Visit and Pregnancy Ultrasound  History of Present Illness:   Hailey Holden is a 38 y.o. [id number] female at [redacted]w[redacted]d with an Estimated Date of Delivery: 05/24/24 being seen today for ongoing management of a high-risk pregnancy complicated by     ICD-10-CM   1. Supervision of high risk pregnancy, antepartum  O09.90     2. [redacted] weeks gestation of pregnancy  Z3A.30     3. Pregnant state, incidental  Z33.1 POC Urinalysis Dipstick OB    4. Screening for genitourinary condition  Z13.89 POC Urinalysis Dipstick OB    5. Ventriculomegaly of brain on fetal ultrasound  O35.06X0     6. Trisomy 21 syndrome, likely given NIPS and sonogram findings, not definitively diabnosed  Q90.9       .    Today she reports no complaints. Contractions: Not present.  .  Movement: Present. denies leaking of fluid.      11/16/2023    3:23 PM 09/12/2023    9:17 AM 06/15/2023   11:40 AM 08/24/2022    1:16 PM 04/27/2022   10:32 AM  Depression screen PHQ 2/9  Decreased Interest 0 0 0 0 1  Down, Depressed, Hopeless 0 0 1 0 0  PHQ - 2 Score 0 0 1 0 1  Altered sleeping 1 1 3 1 1   Tired, decreased energy 1 1 3 1 3   Change in appetite 0 1 3 1 1   Feeling bad or failure about yourself  0 0 1 0 0  Trouble concentrating 0 1 2 0 2  Moving slowly or fidgety/restless 0 0 2 0 0  Suicidal thoughts 0 0 0 0 0  PHQ-9 Score 2  4  15  3  8    Difficult doing work/chores  Not difficult at all  Not difficult at all      Data saved with a previous flowsheet row definition        11/16/2023    3:23 PM 09/12/2023    9:17 AM 06/15/2023   11:40 AM 08/24/2022    1:17 PM  GAD 7 : Generalized Anxiety Score  Nervous, Anxious, on Edge 0 0 0 0  Control/stop worrying 0 0 0 0  Worry too much - different things 0 0 0 0  Trouble relaxing 0 1 1 0  Restless 0 0 1 0  Easily annoyed or irritable 0 1 1 0   Afraid - awful might happen 0 0 0 0  Total GAD 7 Score 0 2 3 0  Anxiety Difficulty  Not difficult at all  Not difficult at all     Review of Systems:   Pertinent items are noted in HPI Denies abnormal vaginal discharge w/ itching/odor/irritation, headaches, visual changes, shortness of breath, chest pain, abdominal pain, severe nausea/vomiting, or problems with urination or bowel movements unless otherwise stated above. Pertinent History Reviewed:  Reviewed past medical,surgical, social, obstetrical and family history.  Reviewed problem list, medications and allergies. Physical Assessment:   Vitals:   03/15/24 1001  BP: 130/86  Pulse: (!) 116  Weight: 222 lb 6.4 oz (100.9 kg)  Body mass index is 39.4 kg/m.           Physical Examination:   General appearance: alert, well appearing, and in no distress  Mental status: alert, oriented to person, place, and time  Skin: warm & dry  Extremities: Edema: None    Cardiovascular: normal heart rate noted  Respiratory: normal respiratory effort, no distress  Abdomen: gravid, soft, non-tender  Pelvic: Cervical exam deferred         Fetal Status:     Movement: Present    Fetal Surveillance Testing today: sonogram 15% with 0.1% femurs, cerebral ventriculomegaly, AV canal defect, both known  Chaperone:     Results for orders placed or performed in visit on 03/15/24 (from the past 24 hours)  POC Urinalysis Dipstick OB   Collection Time: 03/15/24 10:15 AM  Result Value Ref Range   Color, UA     Clarity, UA     Glucose, UA Negative Negative   Bilirubin, UA 1+    Ketones, UA 1+    Spec Grav, UA     Blood, UA negative    pH, UA     POC,PROTEIN,UA Small (1+) Negative, Trace, Small (1+), Moderate (2+), Large (3+), 4+   Urobilinogen, UA     Nitrite, UA negative    Leukocytes, UA Trace (A) Negative   Appearance     Odor      Assessment & Plan:  High-risk pregnancy: [id number] at [redacted]w[redacted]d with an Estimated Date of Delivery: 05/24/24       ICD-10-CM   1. Supervision of high risk pregnancy, antepartum  O09.90     2. [redacted] weeks gestation of pregnancy  Z3A.30     3. Pregnant state, incidental  Z33.1 POC Urinalysis Dipstick OB    4. Screening for genitourinary condition  Z13.89 POC Urinalysis Dipstick OB         Meds: No orders of the defined types were placed in this encounter.   Orders:  Orders Placed This Encounter  Procedures   POC Urinalysis Dipstick OB     Labs/procedures today: U/S  Treatment Plan:  continue current care plan, CBG borderlin elevated in am, continue to follow add metformin  if continue elevated    Follow-up: as scheduled   Future Appointments  Date Time Provider Department Center  03/21/2024  4:15 PM Kristine Shuck D, RD [facility] None  04/01/2024  3:45 PM [facility] - [facility] US  [facility] None  04/05/2024  8:50 AM [facility] NURSE [facility]  04/05/2024  8:50 AM Marilynn Nest, DO [facility]  04/09/2024  8:50 AM [facility] NURSE [facility]  04/12/2024  8:30 AM [facility] - [facility] US  [facility] None  04/12/2024  9:30 AM Marilynn Nest, DO [facility]  04/15/2024  9:10 AM [facility] NURSE [facility]  04/18/2024  8:30 AM [facility] US  [facility] None  04/18/2024  9:30 AM Jayne Vonn DEL, MD [facility]  04/22/2024  9:10 AM [facility] NURSE [facility]  04/25/2024  8:30 AM [facility] US  [facility] None  04/25/2024  9:30 AM Jayne Vonn DEL, MD [facility]  04/30/2024  9:10 AM [facility] NURSE [facility]  05/03/2024  8:30 AM [facility] US  [facility] None  05/03/2024  9:50 AM Ozan, Jennifer, DO [facility]  05/07/2024  8:50 AM [facility] NURSE [facility]  05/10/2024  8:30 AM [facility] - [facility] US  [facility] None  05/10/2024  9:50 AM Ozan, Jennifer, DO [facility]    Orders Placed This Encounter  Procedures   POC Urinalysis Dipstick OB   Vonn DEL Jayne  Attending Physician for the Center for [facility] 03/15/2024 12:24 PM  "

## 2024-03-15 NOTE — Progress Notes (Signed)
 US  29+2 wks,breech,posterior placenta gr 0,normal ovaries,FHR  143 bpm,VSD,ASD,AFI 17 cm,CX 3.9 cm,EFW 1346 g 15%,AC 54%,FL .1%, HL . 2%dilated lateral ventricles,left ventricle 1.2 cm,right ventricle 1.2 cm,bilateral renal pelvic dilatation,RK 5.7 mm,LK 6.6 mm,strawberry-shaped skull

## 2024-03-21 ENCOUNTER — Encounter: Admitting: Nutrition

## 2024-03-22 ENCOUNTER — Other Ambulatory Visit: Payer: Self-pay | Admitting: Obstetrics & Gynecology

## 2024-03-22 ENCOUNTER — Encounter: Payer: Self-pay | Admitting: Obstetrics & Gynecology

## 2024-03-22 DIAGNOSIS — O10919 Unspecified pre-existing hypertension complicating pregnancy, unspecified trimester: Secondary | ICD-10-CM

## 2024-03-22 DIAGNOSIS — O285 Abnormal chromosomal and genetic finding on antenatal screening of mother: Secondary | ICD-10-CM

## 2024-03-29 MED ORDER — METFORMIN HCL 500 MG PO TABS: 500.0000 mg | ORAL_TABLET | Freq: Every day | ORAL | 1 refills | Status: DC

## 2024-03-29 NOTE — Addendum Note (Signed)
 Addended by: JAYNE VONN DEL on: 03/29/2024 08:57 PM   Modules accepted: Orders

## 2024-04-01 ENCOUNTER — Ambulatory Visit

## 2024-04-01 DIAGNOSIS — O283 Abnormal ultrasonic finding on antenatal screening of mother: Secondary | ICD-10-CM

## 2024-04-01 DIAGNOSIS — O285 Abnormal chromosomal and genetic finding on antenatal screening of mother: Secondary | ICD-10-CM

## 2024-04-01 DIAGNOSIS — O35BXX0 Maternal care for other (suspected) fetal abnormality and damage, fetal cardiac anomalies, not applicable or unspecified: Secondary | ICD-10-CM

## 2024-04-01 DIAGNOSIS — Z3A32 32 weeks gestation of pregnancy: Secondary | ICD-10-CM | POA: Diagnosis not present

## 2024-04-01 DIAGNOSIS — O3509X0 Maternal care for (suspected) other central nervous system malformation or damage in fetus, not applicable or unspecified: Secondary | ICD-10-CM

## 2024-04-01 DIAGNOSIS — O10919 Unspecified pre-existing hypertension complicating pregnancy, unspecified trimester: Secondary | ICD-10-CM

## 2024-04-01 DIAGNOSIS — O0990 Supervision of high risk pregnancy, unspecified, unspecified trimester: Secondary | ICD-10-CM

## 2024-04-01 NOTE — Progress Notes (Signed)
 US  32+3 wks,cephalic,FHR 139 bpm,posterior placenta gr 3,AFI 18 cm,BPP 8/8,right renal pelvic 3.4 mm,left renal pelvis 3.6 mm WNL,dilated lateral ventricles, left lateral ventricle 1.3 cm,right lateral ventricle 1.7 cm,elevated UAD with EDF,RI .79,.90,.92=99%,abnormal heart seen on prior US 

## 2024-04-02 ENCOUNTER — Other Ambulatory Visit: Admitting: Radiology

## 2024-04-02 ENCOUNTER — Encounter: Admitting: Women's Health

## 2024-04-04 NOTE — L&D Delivery Note (Signed)
 Delivery Note Hailey Holden is a 39 y.o. [id number] at [redacted]w[redacted]d admitted for IOL for intermittent absent EDF. Pregnancy also complicated by FGR (7%), A2DM (metformin  500mg  BID) and CHTN (Norvasc ). .   GBS Status:  PRESUMPTIVE NEGATIVE/-- (01/15 1607)  Labor course: Initial SVE: 1/thick/-3. Augmentation with: AROM, Pitocin , and IP Foley. She then progressed to complete.  ROM: 11h 20m with clear fluid  Birth: After a 1 push 2nd stage, she delivered a Live born female  Birth Weight: 5 lb 6.1 oz (2440 g) APGAR: 9, 9  Newborn Delivery   Birth date/time: [birth year]20[birth year] 10:52:00 Delivery type: Vaginal, Spontaneous        Called by RN d/t pt feeling pressure and completely dilated.  Upon arrival to the room 3 minutes later, baby was already out. Delivered via spontaneous vaginal delivery (Presentation: OA ). Nuchal cord present: No. Shoulders and body delivered in usual fashion. Infant placed directly on mom's abdomen for bonding/skin-to-skin, baby dried and stimulated. Cord clamped x 2 after 1 minute and cut by FOB.  Cord blood collected. Placenta delivered-Spontaneous with 3 vessels. 20u Pitocin  in 500cc LR given as a bolus prior to delivery of placenta.  Fundus firm with massage. Placenta inspected and appears to be intact with a 3 VC.  Sponge and instrument count were correct x2.  Intrapartum complications:  None Anesthesia:  none Lacerations:  none Suture Repair: N/A EBL (mL):161.00   Newborn Data: Birth date:04[birth year]20[birth year] Birth time:10:52 AM Gender:Female Living status:Living Apgars:9 ,9  Weight:2440 g    Mom to postpartum.  Baby to NICU. Placenta to Pathology for FGR   Plans to Breast and bottlefeed Contraception: vasectomy Circumcision: wants outpatient  Note sent to [facility]: [facility] for pp visit.  Delivery Report:  Review the Delivery Report for details.     Signed: Cathlean Ely, DNP,CNM 04/19/2024, 11:31 AM

## 2024-04-05 ENCOUNTER — Encounter: Payer: Self-pay | Admitting: Obstetrics & Gynecology

## 2024-04-05 ENCOUNTER — Other Ambulatory Visit

## 2024-04-05 ENCOUNTER — Ambulatory Visit: Admitting: Obstetrics & Gynecology

## 2024-04-05 VITALS — BP 131/82 | HR 120 | Wt 222.2 lb

## 2024-04-05 DIAGNOSIS — E669 Obesity, unspecified: Secondary | ICD-10-CM

## 2024-04-05 DIAGNOSIS — O09523 Supervision of elderly multigravida, third trimester: Secondary | ICD-10-CM

## 2024-04-05 DIAGNOSIS — O3513X0 Maternal care for (suspected) chromosomal abnormality in fetus, trisomy 21, not applicable or unspecified: Secondary | ICD-10-CM

## 2024-04-05 DIAGNOSIS — O35BXX0 Maternal care for other (suspected) fetal abnormality and damage, fetal cardiac anomalies, not applicable or unspecified: Secondary | ICD-10-CM

## 2024-04-05 DIAGNOSIS — E039 Hypothyroidism, unspecified: Secondary | ICD-10-CM | POA: Diagnosis not present

## 2024-04-05 DIAGNOSIS — O3509X0 Maternal care for (suspected) other central nervous system malformation or damage in fetus, not applicable or unspecified: Secondary | ICD-10-CM | POA: Diagnosis not present

## 2024-04-05 DIAGNOSIS — O99283 Endocrine, nutritional and metabolic diseases complicating pregnancy, third trimester: Secondary | ICD-10-CM

## 2024-04-05 DIAGNOSIS — Q909 Down syndrome, unspecified: Secondary | ICD-10-CM

## 2024-04-05 DIAGNOSIS — Z3A33 33 weeks gestation of pregnancy: Secondary | ICD-10-CM | POA: Diagnosis not present

## 2024-04-05 DIAGNOSIS — O99213 Obesity complicating pregnancy, third trimester: Secondary | ICD-10-CM

## 2024-04-05 DIAGNOSIS — O24415 Gestational diabetes mellitus in pregnancy, controlled by oral hypoglycemic drugs: Secondary | ICD-10-CM | POA: Diagnosis not present

## 2024-04-05 DIAGNOSIS — O10913 Unspecified pre-existing hypertension complicating pregnancy, third trimester: Secondary | ICD-10-CM

## 2024-04-05 DIAGNOSIS — O10919 Unspecified pre-existing hypertension complicating pregnancy, unspecified trimester: Secondary | ICD-10-CM

## 2024-04-05 DIAGNOSIS — O0990 Supervision of high risk pregnancy, unspecified, unspecified trimester: Secondary | ICD-10-CM

## 2024-04-05 LAB — POCT URINALYSIS DIPSTICK OB
Blood, UA: NEGATIVE
Glucose, UA: NEGATIVE
Ketones, UA: NEGATIVE
Nitrite, UA: NEGATIVE
POC,PROTEIN,UA: NEGATIVE

## 2024-04-05 MED ORDER — METFORMIN HCL 500 MG PO TABS: 500.0000 mg | ORAL_TABLET | Freq: Two times a day (BID) | ORAL | 3 refills | Status: DC

## 2024-04-05 MED ORDER — AMLODIPINE BESYLATE 10 MG PO TABS: 10.0000 mg | ORAL_TABLET | Freq: Every day | ORAL | 4 refills | Status: AC

## 2024-04-05 NOTE — Progress Notes (Signed)
 "  HIGH-RISK PREGNANCY VISIT Patient name: Hailey Holden MRN [id number]  Date of birth: [birth year] Chief Complaint:   Routine Prenatal Visit  History of Present Illness:   Hailey Holden is a 38 y.o. [id number] female at [redacted]w[redacted]d with an Estimated Date of Delivery: 05/24/24 being seen today for ongoing management of a high-risk pregnancy complicated by  -chronic HTN Asymptomatic, BPs mostly 140/90s Currently on Norvasc  5mg  daily  -GDMA2 Currently on MTF at bedtime.  Fastings starting to improve, elevated 2hr PP specifically breakfast  -Suspected T21 with cardiac abnormalities and ventriculomegaly -Hypothyroidism -obesity -AMA  Today she reports no complaints.   Contractions: Not present. Vag. Bleeding: None.  Movement: Present. denies leaking of fluid.      11/16/2023    3:23 PM 09/12/2023    9:17 AM 06/15/2023   11:40 AM 08/24/2022    1:16 PM 04/27/2022   10:32 AM  Depression screen PHQ 2/9  Decreased Interest 0 0 0 0 1  Down, Depressed, Hopeless 0 0 1 0 0  PHQ - 2 Score 0 0 1 0 1  Altered sleeping 1 1 3 1 1   Tired, decreased energy 1 1 3 1 3   Change in appetite 0 1 3 1 1   Feeling bad or failure about yourself  0 0 1 0 0  Trouble concentrating 0 1 2 0 2  Moving slowly or fidgety/restless 0 0 2 0 0  Suicidal thoughts 0 0 0 0 0  PHQ-9 Score 2  4  15  3  8    Difficult doing work/chores  Not difficult at all  Not difficult at all      Data saved with a previous flowsheet row definition     Current Outpatient Medications  Medication Instructions   Accu-Chek Softclix Lancets lancets Use as instructed to check blood sugar 4 times daily   Acetaminophen (TYLENOL PO) Take by mouth.   albuterol  (VENTOLIN  HFA) 108 (90 Base) MCG/ACT inhaler INHALE TWO PUFFS INTO THE LUNGS EVERY FOUR HOURS AS NEEDED FOR WHEEZING   amLODipine  (NORVASC ) 10 mg, Oral, Daily   aspirin  EC 162 mg, Oral, Daily, Swallow whole.   Blood Glucose Monitoring Suppl (ACCU-CHEK GUIDE ME) w/Device KIT 1 each, Does not  apply, 4 times daily   Cetirizine HCl 10 mg, Daily   Cholecalciferol (VITAMIN D ) 125 MCG (5000 UT) CAPS 1 capsule, Daily   Coenzyme Q10 (CO Q 10 PO) Take by mouth.   docusate sodium (COLACE) 100 mg, 2 times daily   folic acid (FOLVITE) 1 mg, Daily   glucose blood test strip Use as instructed to check blood sugar four times daily   levothyroxine  (SYNTHROID ) 25 MCG tablet TAKE ONE (1) TABLET BY MOUTH EVERY DAY BEFORE BREAKFAST   Menaquinone-7 (K2 PO) 100 mcg, Daily   metFORMIN  (GLUCOPHAGE ) 500 mg, Oral, 2 times daily with meals     Review of Systems:   Pertinent items are noted in HPI Denies abnormal vaginal discharge w/ itching/odor/irritation, headaches, visual changes, shortness of breath, chest pain, abdominal pain, severe nausea/vomiting, or problems with urination or bowel movements unless otherwise stated above. Pertinent History Reviewed:  Reviewed past medical,surgical, social, obstetrical and family history.  Reviewed problem list, medications and allergies. Physical Assessment:   Vitals:   04/05/24 0838  BP: 131/82  Pulse: (!) 120  Weight: 222 lb 3.2 oz (100.8 kg)  Body mass index is 39.36 kg/m.           Physical Examination:   General appearance: alert,  well appearing, and in no distress  Mental status: normal mood, behavior, speech, dress, motor activity, and thought processes  Skin: warm & dry   Extremities:      Cardiovascular: normal heart rate noted  Respiratory: normal respiratory effort, no distress  Abdomen: gravid, soft, non-tender  Pelvic: Cervical exam deferred         Fetal Status:     Movement: Present    Fetal Surveillance Testing today: NST  NST being performed due to GDMA2, chronic HTN   Fetal Monitoring:  Baseline: 130 bpm, Variability: moderate, Accelerations: present, The accelerations are >15 bpm and more than 2 in 20 minutes, and Decelerations: Absent     Final diagnosis:  Reactive NST      Chaperone: N/A    Results for orders  placed or performed in visit on 04/05/24 (from the past 24 hours)  POC Urinalysis Dipstick OB   Collection Time: 04/05/24  8:47 AM  Result Value Ref Range   Color, UA     Clarity, UA     Glucose, UA Negative Negative   Bilirubin, UA     Ketones, UA neg    Spec Grav, UA     Blood, UA neg    pH, UA     POC,PROTEIN,UA Negative Negative, Trace, Small (1+), Moderate (2+), Large (3+), 4+   Urobilinogen, UA     Nitrite, UA neg    Leukocytes, UA Trace (A) Negative   Appearance     Odor       Assessment & Plan:  High-risk pregnancy: H5E7987 at [redacted]w[redacted]d with an Estimated Date of Delivery: 05/24/24   -chronic HTN Increase to Norvasc  10mg  daily  -GDMA2 Increase to MTF 500mg  twice daily  -Suspected T21 with cardiac abnormalities and ventriculomegaly -Hypothyroidism -obesity -AMA  Meds:  Meds ordered this encounter  Medications   amLODipine  (NORVASC ) 10 MG tablet    Sig: Take 1 tablet (10 mg total) by mouth daily.    Dispense:  90 tablet    Refill:  4   metFORMIN  (GLUCOPHAGE ) 500 MG tablet    Sig: Take 1 tablet (500 mg total) by mouth 2 (two) times daily with a meal.    Dispense:  180 tablet    Refill:  3    Labs/procedures today: NST  Treatment Plan:  routine OB care and as outlined above  Reviewed: Preterm labor symptoms and general obstetric precautions including but not limited to vaginal bleeding, contractions, leaking of fluid and fetal movement were reviewed in detail with the patient.  All questions were answered. Pt has home bp cuff. Check bp weekly, let us  know if >160/110.   Follow-up: Return for twice weekly as scheduled.   Future Appointments  Date Time Provider Department Center  04/09/2024  8:50 AM [facility] NURSE [facility]  04/12/2024  8:30 AM [facility] - [facility] US  [facility] None  04/12/2024  9:30 AM Marilynn Nest, DO [facility]  04/15/2024  9:10 AM [facility] NURSE [facility]  04/18/2024  8:30 AM [facility] - [facility] US  [facility] None  04/18/2024  9:30 AM  Jayne Vonn DEL, MD [facility]  04/22/2024  9:10 AM [facility] NURSE [facility]  04/25/2024  8:30 AM [facility] US  [facility] None  04/25/2024  9:30 AM Jayne Vonn DEL, MD [facility]  04/30/2024  9:10 AM [facility] NURSE [facility]  05/03/2024  8:30 AM [facility] US  [facility] None  05/03/2024  9:50 AM Jeet Shough, DO [facility]  05/07/2024  8:50 AM [facility]  NURSE [facility]  05/10/2024  8:30 AM [facility] US  [facility] None  05/10/2024  9:50 AM Chrles Selley, DO [facility]    Orders Placed This Encounter  Procedures   POC Urinalysis Dipstick OB    Xylan Sheils, DO Attending Obstetrician & Gynecologist, [facility] for [employer], [facility]    "

## 2024-04-09 ENCOUNTER — Ambulatory Visit (INDEPENDENT_AMBULATORY_CARE_PROVIDER_SITE_OTHER)

## 2024-04-09 VITALS — BP 130/86 | HR 74

## 2024-04-09 DIAGNOSIS — O10913 Unspecified pre-existing hypertension complicating pregnancy, third trimester: Secondary | ICD-10-CM | POA: Diagnosis not present

## 2024-04-09 DIAGNOSIS — O10919 Unspecified pre-existing hypertension complicating pregnancy, unspecified trimester: Secondary | ICD-10-CM

## 2024-04-09 DIAGNOSIS — O0990 Supervision of high risk pregnancy, unspecified, unspecified trimester: Secondary | ICD-10-CM

## 2024-04-09 DIAGNOSIS — Z3A33 33 weeks gestation of pregnancy: Secondary | ICD-10-CM

## 2024-04-09 DIAGNOSIS — O09523 Supervision of elderly multigravida, third trimester: Secondary | ICD-10-CM

## 2024-04-09 DIAGNOSIS — O24419 Gestational diabetes mellitus in pregnancy, unspecified control: Secondary | ICD-10-CM | POA: Diagnosis not present

## 2024-04-09 DIAGNOSIS — O24415 Gestational diabetes mellitus in pregnancy, controlled by oral hypoglycemic drugs: Secondary | ICD-10-CM

## 2024-04-09 NOTE — Progress Notes (Signed)
" ° °  NURSE VISIT- NST  SUBJECTIVE:  Hailey Holden is a 39 y.o. [phone] female at [redacted]w[redacted]d, here for a NST for pregnancy complicated by AMA, CHTN, and Diabetes: A2DM.  She reports active fetal movement, contractions: none, vaginal bleeding: none, membranes: intact.   OBJECTIVE:  BP 130/86   Pulse 74   LMP 05/21/2023 (Exact Date)   Appears well, no apparent distress  No results found for this or any previous visit (from the past 24 hours).  NST: FHR baseline 135 bpm, Variability: moderate, Accelerations:present, Decelerations:  Absent= Cat 1/reactive Toco: none   ASSESSMENT: [id number] at [redacted]w[redacted]d with AMA, CHTN, and Diabetes: A2DM NST reactive  PLAN: EFM strip reviewed by Dr. Jayne   Recommendations: keep next appointment as scheduled    Aleck FORBES Blase  04/09/2024 8:52 AM  "

## 2024-04-11 ENCOUNTER — Other Ambulatory Visit: Payer: Self-pay | Admitting: Obstetrics & Gynecology

## 2024-04-11 DIAGNOSIS — O285 Abnormal chromosomal and genetic finding on antenatal screening of mother: Secondary | ICD-10-CM

## 2024-04-11 DIAGNOSIS — O368390 Maternal care for abnormalities of the fetal heart rate or rhythm, unspecified trimester, not applicable or unspecified: Secondary | ICD-10-CM

## 2024-04-12 ENCOUNTER — Ambulatory Visit: Admitting: Obstetrics & Gynecology

## 2024-04-12 ENCOUNTER — Ambulatory Visit

## 2024-04-12 ENCOUNTER — Other Ambulatory Visit: Payer: Self-pay | Admitting: Obstetrics & Gynecology

## 2024-04-12 VITALS — BP 128/84 | HR 109 | Wt 223.4 lb

## 2024-04-12 DIAGNOSIS — O10013 Pre-existing essential hypertension complicating pregnancy, third trimester: Secondary | ICD-10-CM

## 2024-04-12 DIAGNOSIS — E039 Hypothyroidism, unspecified: Secondary | ICD-10-CM | POA: Diagnosis not present

## 2024-04-12 DIAGNOSIS — O3509X0 Maternal care for (suspected) other central nervous system malformation or damage in fetus, not applicable or unspecified: Secondary | ICD-10-CM | POA: Diagnosis not present

## 2024-04-12 DIAGNOSIS — O99283 Endocrine, nutritional and metabolic diseases complicating pregnancy, third trimester: Secondary | ICD-10-CM | POA: Diagnosis not present

## 2024-04-12 DIAGNOSIS — O10919 Unspecified pre-existing hypertension complicating pregnancy, unspecified trimester: Secondary | ICD-10-CM

## 2024-04-12 DIAGNOSIS — O285 Abnormal chromosomal and genetic finding on antenatal screening of mother: Secondary | ICD-10-CM

## 2024-04-12 DIAGNOSIS — O24415 Gestational diabetes mellitus in pregnancy, controlled by oral hypoglycemic drugs: Secondary | ICD-10-CM

## 2024-04-12 DIAGNOSIS — O35BXX0 Maternal care for other (suspected) fetal abnormality and damage, fetal cardiac anomalies, not applicable or unspecified: Secondary | ICD-10-CM | POA: Diagnosis not present

## 2024-04-12 DIAGNOSIS — Z3A34 34 weeks gestation of pregnancy: Secondary | ICD-10-CM | POA: Diagnosis not present

## 2024-04-12 DIAGNOSIS — O321XX0 Maternal care for breech presentation, not applicable or unspecified: Secondary | ICD-10-CM

## 2024-04-12 DIAGNOSIS — Z3A32 32 weeks gestation of pregnancy: Secondary | ICD-10-CM | POA: Diagnosis not present

## 2024-04-12 DIAGNOSIS — O368390 Maternal care for abnormalities of the fetal heart rate or rhythm, unspecified trimester, not applicable or unspecified: Secondary | ICD-10-CM

## 2024-04-12 DIAGNOSIS — O0990 Supervision of high risk pregnancy, unspecified, unspecified trimester: Secondary | ICD-10-CM

## 2024-04-12 DIAGNOSIS — Q909 Down syndrome, unspecified: Secondary | ICD-10-CM

## 2024-04-12 DIAGNOSIS — O365930 Maternal care for other known or suspected poor fetal growth, third trimester, not applicable or unspecified: Secondary | ICD-10-CM | POA: Diagnosis not present

## 2024-04-12 NOTE — Progress Notes (Signed)
 "  HIGH-RISK PREGNANCY VISIT Patient name: Hailey Holden MRN [id number]  Date of birth: Feb 26, 1986 Chief Complaint:   Routine Prenatal Visit  History of Present Illness:   Hailey Holden is a 39 y.o. [id number] female at [redacted]w[redacted]d with an Estimated Date of Delivery: 05/24/24 being seen today for ongoing management of a high-risk pregnancy complicated by:  --chronic HTN Asymptomatic, BP improved with Norvasc  10mg  daily   -GDMA2 Now MTF 500mg  bid, sugars much improved, only 1 elevated   -Suspected T21 with cardiac abnormalities and ventriculomegaly -Hypothyroidism -Obesity -AMA  Today she reports no complaints.   Contractions: Not present. Vag. Bleeding: None.  Movement: Present. denies leaking of fluid.      11/16/2023    3:23 PM 09/12/2023    9:17 AM 06/15/2023   11:40 AM 08/24/2022    1:16 PM 04/27/2022   10:32 AM  Depression screen PHQ 2/9  Decreased Interest 0 0 0 0 1  Down, Depressed, Hopeless 0 0 1 0 0  PHQ - 2 Score 0 0 1 0 1  Altered sleeping 1 1 3 1 1   Tired, decreased energy 1 1 3 1 3   Change in appetite 0 1 3 1 1   Feeling bad or failure about yourself  0 0 1 0 0  Trouble concentrating 0 1 2 0 2  Moving slowly or fidgety/restless 0 0 2 0 0  Suicidal thoughts 0 0 0 0 0  PHQ-9 Score 2  4  15  3  8    Difficult doing work/chores  Not difficult at all  Not difficult at all      Data saved with a previous flowsheet row definition     Current Outpatient Medications  Medication Instructions   Accu-Chek Softclix Lancets lancets Use as instructed to check blood sugar 4 times daily   albuterol  (VENTOLIN  HFA) 108 (90 Base) MCG/ACT inhaler INHALE TWO PUFFS INTO THE LUNGS EVERY FOUR HOURS AS NEEDED FOR WHEEZING   amLODipine  (NORVASC ) 10 mg, Oral, Daily   aspirin  EC 162 mg, Oral, Daily, Swallow whole.   Blood Glucose Monitoring Suppl (ACCU-CHEK GUIDE ME) w/Device KIT 1 each, Does not apply, 4 times daily   Cetirizine HCl 10 mg, Daily   Cholecalciferol (VITAMIN D ) 125 MCG (5000  UT) CAPS 1 capsule, Daily   Coenzyme Q10 (CO Q 10 PO) Take by mouth.   docusate sodium  (COLACE) 100 mg, 2 times daily   folic acid (FOLVITE) 1 mg, Daily   glucose blood test strip Use as instructed to check blood sugar four times daily   levothyroxine  (SYNTHROID ) 25 MCG tablet TAKE ONE (1) TABLET BY MOUTH EVERY DAY BEFORE BREAKFAST   Menaquinone-7 (K2 PO) 100 mcg, Daily   metFORMIN  (GLUCOPHAGE ) 500 mg, Oral, 2 times daily with meals     Review of Systems:   Pertinent items are noted in HPI Denies abnormal vaginal discharge w/ itching/odor/irritation, headaches, visual changes, shortness of breath, chest pain, abdominal pain, severe nausea/vomiting, or problems with urination or bowel movements unless otherwise stated above. Pertinent History Reviewed:  Reviewed past medical,surgical, social, obstetrical and family history.  Reviewed problem list, medications and allergies. Physical Assessment:   Vitals:   04/12/24 0923  BP: 128/84  Pulse: (!) 109  Weight: 223 lb 6.4 oz (101.3 kg)  Body mass index is 39.57 kg/m.           Physical Examination:   General appearance: alert, well appearing, and in no distress  Mental status: normal mood, behavior, speech,  dress, motor activity, and thought processes  Skin: warm & dry   Extremities:      Cardiovascular: normal heart rate noted  Respiratory: normal respiratory effort, no distress  Abdomen: gravid, soft, non-tender  Pelvic: Cervical exam deferred         Fetal Status:     Movement: Present    Fetal Surveillance Testing today: ,frank breech,posterior placenta gr 3,AFI 18 cm,known heart abnormality,dilatated lateral ventricles RV 1.8 cm,LV 1.5 cm,BPP 8/8,FHR 134 bpm,elevated UAD with EDF .88,.92,.90    Chaperone: N/A    No results found for this or any previous visit (from the past 24 hours).   Assessment & Plan:  High-risk pregnancy: [id number] at [redacted]w[redacted]d with an Estimated Date of Delivery: 05/24/24   1. [redacted] weeks gestation of  pregnancy   2. Chronic hypertension affecting pregnancy - Doing better with Norvasc  10 mg daily - BPP 8/8, continue antepartum testing - Plan to proceed with delivery approximately 37 weeks  3. Supervision of high risk pregnancy, antepartum (Primary) []  GBS next visit  4. GDMA2- MTF 500mg  BID -No change in current medication  5. Hypothyroidism - Level appropriate, last checked 11/25  6. Dempsey breech presentation - Briefly discussed ECV versus primary C-section.  Discussed risks/benefit of ECV and timing of procedure.  Patient notes that at last week's scan, baby was cephalic.  Suspect unstable lie and will reevaluate at next week's appointment   Meds: No orders of the defined types were placed in this encounter.   Labs/procedures today: BPP/dopplers  Treatment Plan: Routine OB care and as outlined above  Reviewed: Preterm labor symptoms and general obstetric precautions including but not limited to vaginal bleeding, contractions, leaking of fluid and fetal movement were reviewed in detail with the patient.  All questions were answered.  Patient has home bp cuff. Check bp weekly, let us  know if >140/90.   Follow-up: Return for as scheduled twice weekly.   Future Appointments  Date Time Provider Department Center  04/15/2024  9:10 AM [facility] NURSE [facility]  04/18/2024  8:30 AM [facility]  [facility] None  04/18/2024  9:30 AM Jayne Vonn DEL, MD [facility]  04/22/2024  9:10 AM [facility]URSE [facility]  04/25/2024  8:30 AM [facility] US  [facility] None  04/25/2024  9:30 AM Jayne Vonn DEL, MD [facility]  04/30/2024  9:10 AM [facility]  05/03/2024  8:30 AM [facility] US  [facility] None  05/03/2024  9:50 AM Jarrette Dehner, DO [facility]  05/07/2024  8:50 AM [facility]  05/10/2024  8:30 AM [facility] US  [facility] None  05/10/2024  9:50 AM Marilynn Nest, DO [facility]    No orders of the defined types  were placed in this encounter.   Lourdes Kucharski, DO Attending Obstetrician & Gynecologist, [facility] for [facility], [facility]    "

## 2024-04-12 NOTE — Progress Notes (Addendum)
 US  34 wks,frank breech,posterior placenta gr 3,AFI 18 cm,known heart abnormality,dilatated lateral ventricles RV 1.8 cm,LV 1.5 cm,BPP 8/8,FHR 134 bpm,elevated UAD with EDF .88,.92,.90,EFW 1919 g 7%,AC 32%,FL out of range

## 2024-04-13 ENCOUNTER — Encounter: Payer: Self-pay | Admitting: Obstetrics & Gynecology

## 2024-04-13 DIAGNOSIS — O365990 Maternal care for other known or suspected poor fetal growth, unspecified trimester, not applicable or unspecified: Secondary | ICD-10-CM | POA: Insufficient documentation

## 2024-04-15 ENCOUNTER — Ambulatory Visit

## 2024-04-15 VITALS — BP 121/82

## 2024-04-15 DIAGNOSIS — O10913 Unspecified pre-existing hypertension complicating pregnancy, third trimester: Secondary | ICD-10-CM | POA: Diagnosis not present

## 2024-04-15 DIAGNOSIS — O24419 Gestational diabetes mellitus in pregnancy, unspecified control: Secondary | ICD-10-CM

## 2024-04-15 DIAGNOSIS — Z3A34 34 weeks gestation of pregnancy: Secondary | ICD-10-CM | POA: Diagnosis not present

## 2024-04-15 DIAGNOSIS — O09523 Supervision of elderly multigravida, third trimester: Secondary | ICD-10-CM | POA: Diagnosis not present

## 2024-04-15 DIAGNOSIS — O0990 Supervision of high risk pregnancy, unspecified, unspecified trimester: Secondary | ICD-10-CM

## 2024-04-15 DIAGNOSIS — O24415 Gestational diabetes mellitus in pregnancy, controlled by oral hypoglycemic drugs: Secondary | ICD-10-CM

## 2024-04-15 DIAGNOSIS — O10919 Unspecified pre-existing hypertension complicating pregnancy, unspecified trimester: Secondary | ICD-10-CM

## 2024-04-15 NOTE — Progress Notes (Signed)
" ° °  NURSE VISIT- NST  SUBJECTIVE:  Hailey Holden is a 39 y.o. [phone] female at [redacted]w[redacted]d, here for a NST for pregnancy complicated by AMA, CHTN, and Diabetes: A2DM.  She reports active fetal movement, contractions: none, vaginal bleeding: none, membranes: intact.   OBJECTIVE:  BP 121/82   LMP 05/21/2023 (Exact Date)   Appears well, no apparent distress  No results found for this or any previous visit (from the past 24 hours).  NST: FHR baseline 135 bpm, Variability: moderate, Accelerations:present, Decelerations:  Absent= Cat 1/reactive Toco: none   ASSESSMENT: [id number] at [redacted]w[redacted]d with AMA, CHTN, and Diabetes: A2DM NST reactive  PLAN: EFM strip reviewed by Dr. Jayne   Recommendations: keep next appointment as scheduled    Aleck FORBES Blase  04/15/2024 9:24 AM  "

## 2024-04-17 ENCOUNTER — Other Ambulatory Visit: Payer: Self-pay | Admitting: Obstetrics & Gynecology

## 2024-04-17 DIAGNOSIS — O10919 Unspecified pre-existing hypertension complicating pregnancy, unspecified trimester: Secondary | ICD-10-CM

## 2024-04-17 DIAGNOSIS — O368390 Maternal care for abnormalities of the fetal heart rate or rhythm, unspecified trimester, not applicable or unspecified: Secondary | ICD-10-CM

## 2024-04-17 DIAGNOSIS — O24419 Gestational diabetes mellitus in pregnancy, unspecified control: Secondary | ICD-10-CM

## 2024-04-17 DIAGNOSIS — O285 Abnormal chromosomal and genetic finding on antenatal screening of mother: Secondary | ICD-10-CM

## 2024-04-18 ENCOUNTER — Ambulatory Visit: Admitting: Obstetrics & Gynecology

## 2024-04-18 ENCOUNTER — Other Ambulatory Visit: Payer: Self-pay

## 2024-04-18 ENCOUNTER — Inpatient Hospital Stay (HOSPITAL_COMMUNITY)
Admission: AD | Admit: 2024-04-18 | Discharge: 2024-04-21 | DRG: 806 | Disposition: A | Discharge status: Home / Self Care | Attending: Obstetrics & Gynecology | Admitting: Obstetrics & Gynecology

## 2024-04-18 ENCOUNTER — Ambulatory Visit

## 2024-04-18 ENCOUNTER — Encounter (HOSPITAL_COMMUNITY): Payer: Self-pay

## 2024-04-18 VITALS — BP 139/88 | HR 100 | Wt 223.0 lb

## 2024-04-18 DIAGNOSIS — O0990 Supervision of high risk pregnancy, unspecified, unspecified trimester: Secondary | ICD-10-CM

## 2024-04-18 DIAGNOSIS — O10919 Unspecified pre-existing hypertension complicating pregnancy, unspecified trimester: Secondary | ICD-10-CM

## 2024-04-18 DIAGNOSIS — O283 Abnormal ultrasonic finding on antenatal screening of mother: Secondary | ICD-10-CM | POA: Diagnosis not present

## 2024-04-18 DIAGNOSIS — Z3A35 35 weeks gestation of pregnancy: Secondary | ICD-10-CM | POA: Diagnosis not present

## 2024-04-18 DIAGNOSIS — Z8249 Family history of ischemic heart disease and other diseases of the circulatory system: Secondary | ICD-10-CM

## 2024-04-18 DIAGNOSIS — O365930 Maternal care for other known or suspected poor fetal growth, third trimester, not applicable or unspecified: Secondary | ICD-10-CM | POA: Diagnosis present

## 2024-04-18 DIAGNOSIS — O3509X0 Maternal care for (suspected) other central nervous system malformation or damage in fetus, not applicable or unspecified: Secondary | ICD-10-CM

## 2024-04-18 DIAGNOSIS — O09523 Supervision of elderly multigravida, third trimester: Secondary | ICD-10-CM

## 2024-04-18 DIAGNOSIS — O3513X0 Maternal care for (suspected) chromosomal abnormality in fetus, trisomy 21, not applicable or unspecified: Secondary | ICD-10-CM

## 2024-04-18 DIAGNOSIS — Z833 Family history of diabetes mellitus: Secondary | ICD-10-CM | POA: Diagnosis not present

## 2024-04-18 DIAGNOSIS — O35BXX0 Maternal care for other (suspected) fetal abnormality and damage, fetal cardiac anomalies, not applicable or unspecified: Secondary | ICD-10-CM

## 2024-04-18 DIAGNOSIS — O3506X0 Maternal care for (suspected) central nervous system malformation or damage in fetus, hydrocephaly, not applicable or unspecified: Secondary | ICD-10-CM

## 2024-04-18 DIAGNOSIS — O1092 Unspecified pre-existing hypertension complicating childbirth: Secondary | ICD-10-CM | POA: Diagnosis present

## 2024-04-18 DIAGNOSIS — Z3A34 34 weeks gestation of pregnancy: Secondary | ICD-10-CM

## 2024-04-18 DIAGNOSIS — O24425 Gestational diabetes mellitus in childbirth, controlled by oral hypoglycemic drugs: Secondary | ICD-10-CM | POA: Diagnosis present

## 2024-04-18 DIAGNOSIS — O285 Abnormal chromosomal and genetic finding on antenatal screening of mother: Secondary | ICD-10-CM

## 2024-04-18 DIAGNOSIS — O24415 Gestational diabetes mellitus in pregnancy, controlled by oral hypoglycemic drugs: Secondary | ICD-10-CM

## 2024-04-18 DIAGNOSIS — Z7982 Long term (current) use of aspirin: Secondary | ICD-10-CM

## 2024-04-18 DIAGNOSIS — O10913 Unspecified pre-existing hypertension complicating pregnancy, third trimester: Secondary | ICD-10-CM | POA: Diagnosis not present

## 2024-04-18 DIAGNOSIS — O368390 Maternal care for abnormalities of the fetal heart rate or rhythm, unspecified trimester, not applicable or unspecified: Secondary | ICD-10-CM

## 2024-04-18 DIAGNOSIS — O24419 Gestational diabetes mellitus in pregnancy, unspecified control: Secondary | ICD-10-CM

## 2024-04-18 DIAGNOSIS — O365990 Maternal care for other known or suspected poor fetal growth, unspecified trimester, not applicable or unspecified: Secondary | ICD-10-CM

## 2024-04-18 DIAGNOSIS — Q909 Down syndrome, unspecified: Secondary | ICD-10-CM

## 2024-04-18 LAB — PROTEIN / CREATININE RATIO, URINE
Creatinine, Urine: 181 mg/dL
Protein Creatinine Ratio: 0.2 mg/mg — ABNORMAL HIGH
Total Protein, Urine: 43 mg/dL

## 2024-04-18 LAB — CBC
HCT: 34.6 % — ABNORMAL LOW (ref 36.0–46.0)
Hemoglobin: 10.9 g/dL — ABNORMAL LOW (ref 12.0–15.0)
MCH: 23.6 pg — ABNORMAL LOW (ref 26.0–34.0)
MCHC: 31.5 g/dL (ref 30.0–36.0)
MCV: 75.1 fL — ABNORMAL LOW (ref 80.0–100.0)
Platelets: 352 K/uL (ref 150–400)
RBC: 4.61 MIL/uL (ref 3.87–5.11)
RDW: 15.2 % (ref 11.5–15.5)
WBC: 13 K/uL — ABNORMAL HIGH (ref 4.0–10.5)
nRBC: 0 % (ref 0.0–0.2)

## 2024-04-18 LAB — COMPREHENSIVE METABOLIC PANEL WITH GFR
ALT: 13 U/L (ref 0–44)
AST: 19 U/L (ref 15–41)
Albumin: 3.4 g/dL — ABNORMAL LOW (ref 3.5–5.0)
Alkaline Phosphatase: 242 U/L — ABNORMAL HIGH (ref 38–126)
Anion gap: 16 — ABNORMAL HIGH (ref 5–15)
BUN: 5 mg/dL — ABNORMAL LOW (ref 6–20)
CO2: 18 mmol/L — ABNORMAL LOW (ref 22–32)
Calcium: 9.1 mg/dL (ref 8.9–10.3)
Chloride: 103 mmol/L (ref 98–111)
Creatinine, Ser: 0.57 mg/dL (ref 0.44–1.00)
GFR, Estimated: >60 mL/min
Glucose, Bld: 105 mg/dL — ABNORMAL HIGH (ref 70–99)
Potassium: 3.7 mmol/L (ref 3.5–5.1)
Sodium: 136 mmol/L (ref 135–145)
Total Bilirubin: 0.5 mg/dL (ref 0.0–1.2)
Total Protein: 7 g/dL (ref 6.5–8.1)

## 2024-04-18 LAB — TYPE AND SCREEN
ABO/RH(D): O POS
Antibody Screen: NEGATIVE

## 2024-04-18 LAB — GROUP B STREP BY PCR: Group B strep by PCR: NEGATIVE

## 2024-04-18 LAB — GLUCOSE, CAPILLARY: Glucose-Capillary: 78 mg/dL (ref 70–99)

## 2024-04-18 MED ORDER — LACTATED RINGERS IV SOLN: INTRAVENOUS | Status: DC

## 2024-04-18 MED ORDER — LACTATED RINGERS IV SOLN
500.0000 mL | INTRAVENOUS | Status: DC | PRN
  Administered 2024-04-19: 500 mL via INTRAVENOUS

## 2024-04-18 MED ORDER — LIDOCAINE HCL (PF) 1 % IJ SOLN: 30.0000 mL | INTRAMUSCULAR | Status: DC | PRN

## 2024-04-18 MED ORDER — CEFAZOLIN SODIUM-DEXTROSE 2-4 GM/100ML-% IV SOLN
2.0000 g | Freq: Once | INTRAVENOUS | Status: AC
  Administered 2024-04-18: 2 g via INTRAVENOUS
  Filled 2024-04-18: qty 100

## 2024-04-18 MED ORDER — SOD CITRATE-CITRIC ACID 500-334 MG/5ML PO SOLN: 30.0000 mL | ORAL | Status: DC | PRN

## 2024-04-18 MED ORDER — LABETALOL HCL 5 MG/ML IV SOLN: 40.0000 mg | INTRAVENOUS | Status: DC | PRN

## 2024-04-18 MED ORDER — OXYTOCIN-SODIUM CHLORIDE 30-0.9 UT/500ML-% IV SOLN
1.0000 m[IU]/min | INTRAVENOUS | Status: DC
  Filled 2024-04-18: qty 500

## 2024-04-18 MED ORDER — LEVOTHYROXINE SODIUM 25 MCG PO TABS
25.0000 ug | ORAL_TABLET | Freq: Every day | ORAL | Status: DC
  Administered 2024-04-19 – 2024-04-21 (×3): 25 ug via ORAL
  Filled 2024-04-18 (×3): qty 1

## 2024-04-18 MED ORDER — OXYTOCIN-SODIUM CHLORIDE 30-0.9 UT/500ML-% IV SOLN: 2.5000 [IU]/h | INTRAVENOUS | Status: DC

## 2024-04-18 MED ORDER — ACETAMINOPHEN 325 MG PO TABS: 650.0000 mg | ORAL_TABLET | ORAL | Status: DC | PRN

## 2024-04-18 MED ORDER — ONDANSETRON HCL 4 MG/2ML IJ SOLN: 4.0000 mg | Freq: Four times a day (QID) | INTRAMUSCULAR | Status: DC | PRN

## 2024-04-18 MED ORDER — ALBUTEROL SULFATE (2.5 MG/3ML) 0.083% IN NEBU: 2.5000 mg | INHALATION_SOLUTION | RESPIRATORY_TRACT | Status: DC | PRN

## 2024-04-18 MED ORDER — OXYCODONE-ACETAMINOPHEN 5-325 MG PO TABS: 1.0000 | ORAL_TABLET | ORAL | Status: DC | PRN

## 2024-04-18 MED ORDER — LABETALOL HCL 5 MG/ML IV SOLN
20.0000 mg | INTRAVENOUS | Status: DC | PRN
  Filled 2024-04-18: qty 4

## 2024-04-18 MED ORDER — CEFAZOLIN SODIUM-DEXTROSE 1-4 GM/50ML-% IV SOLN
1.0000 g | Freq: Three times a day (TID) | INTRAVENOUS | Status: DC
  Administered 2024-04-19: 1 g via INTRAVENOUS
  Filled 2024-04-18 (×2): qty 50

## 2024-04-18 MED ORDER — LABETALOL HCL 5 MG/ML IV SOLN: 80.0000 mg | INTRAVENOUS | Status: DC | PRN

## 2024-04-18 MED ORDER — OXYCODONE-ACETAMINOPHEN 5-325 MG PO TABS: 2.0000 | ORAL_TABLET | ORAL | Status: DC | PRN

## 2024-04-18 MED ORDER — FENTANYL CITRATE (PF) 100 MCG/2ML IJ SOLN: 100.0000 ug | INTRAMUSCULAR | Status: DC | PRN

## 2024-04-18 MED ORDER — AMLODIPINE BESYLATE 10 MG PO TABS
10.0000 mg | ORAL_TABLET | Freq: Every day | ORAL | Status: DC
  Administered 2024-04-19 – 2024-04-21 (×3): 10 mg via ORAL
  Filled 2024-04-18 (×3): qty 1

## 2024-04-18 MED ORDER — HYDRALAZINE HCL 20 MG/ML IJ SOLN: 10.0000 mg | INTRAMUSCULAR | Status: DC | PRN

## 2024-04-18 MED ORDER — TERBUTALINE SULFATE 1 MG/ML IJ SOLN: 0.2500 mg | Freq: Once | INTRAMUSCULAR | Status: DC | PRN

## 2024-04-18 MED ORDER — OXYTOCIN BOLUS FROM INFUSION
333.0000 mL | Freq: Once | INTRAVENOUS | Status: AC
  Administered 2024-04-19: 333 mL via INTRAVENOUS

## 2024-04-18 NOTE — Progress Notes (Signed)
 "   HIGH-RISK PREGNANCY VISIT Patient name: Hailey Holden MRN [id number]  Date of birth: [birth year] Chief Complaint:   Routine Prenatal Visit  History of Present Illness:   Hailey Holden is a 39 y.o. [id number] female at [redacted]w[redacted]d with an Estimated Date of Delivery: 05/24/24 being seen today for ongoing management of a high-risk pregnancy complicated by     ICD-10-CM   1. Supervision of high risk pregnancy, antepartum  O09.90     2. Fetal growth restriction antepartum: 7% EFW, Intermittent absent EDF 04/18/24  O36.5990     3. GDMA2- MTF 500mg  BID  O24.415     4. Chronic hypertension affecting pregnancy: Norvasc  10 mg  O10.919     5. Trisomy 21 syndrome, likely given NIPS and sonogram findings, not definitively diabnosed  Q90.9     6. Ventriculomegaly of brain on fetal ultrasound  O35.06X0     7. 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    Today she reports no complaints. Contractions: Not present. Vag. Bleeding: None.  Movement: Present. denies leaking of fluid.      11/16/2023    3:23 PM 09/12/2023    9:17 AM 06/15/2023   11:40 AM 08/24/2022    1:16 PM 04/27/2022   10:32 AM  Depression screen PHQ 2/9  Decreased Interest 0 0 0 0 1  Down, Depressed, Hopeless 0 0 1 0 0  PHQ - 2 Score 0 0 1 0 1  Altered sleeping 1 1 3 1 1   Tired, decreased energy 1 1 3 1 3   Change in appetite 0 1 3 1 1   Feeling bad or failure about yourself  0 0 1 0 0  Trouble concentrating 0 1 2 0 2  Moving slowly or fidgety/restless 0 0 2 0 0  Suicidal thoughts 0 0 0 0 0  PHQ-9 Score 2  4  15  3  8    Difficult doing work/chores  Not difficult at all  Not difficult at all      Data saved with a previous flowsheet row definition        11/16/2023    3:23 PM 09/12/2023    9:17 AM 06/15/2023   11:40 AM 08/24/2022    1:17 PM  GAD 7 : Generalized Anxiety Score  Nervous, Anxious, on Edge 0 0 0 0  Control/stop worrying 0 0 0 0  Worry too much - different things 0 0 0  0  Trouble relaxing 0 1 1 0  Restless 0 0 1 0  Easily annoyed or irritable 0 1 1 0  Afraid - awful might happen 0 0 0 0  Total GAD 7 Score 0 2 3 0  Anxiety Difficulty  Not difficult at all  Not difficult at all     Review of Systems:   Pertinent items are noted in HPI Denies abnormal vaginal discharge w/ itching/odor/irritation, headaches, visual changes, shortness of breath, chest pain, abdominal pain, severe nausea/vomiting, or problems with urination or bowel movements unless otherwise stated above. Pertinent History Reviewed:  Reviewed past medical,surgical, social, obstetrical and family history.  Reviewed problem list, medications and allergies. Physical Assessment:   Vitals:   04/18/24 0915 04/18/24 0927  BP: (!) 146/74 139/88  Pulse: 100   Weight: 223 lb (101.2 kg)   Body mass index is 39.5 kg/m.           Physical Examination:   General appearance:  alert, well appearing, and in no distress  Mental status: alert, oriented to person, place, and time  Skin: warm & dry   Extremities:      Cardiovascular: normal heart rate noted  Respiratory: normal respiratory effort, no distress  Abdomen: gravid, soft, non-tender  Pelvic: Cervical exam performed FT/th/-3/vertex/soft/post        Fetal Status:     Movement: Present    Fetal Surveillance Testing today: BPP 8/8 with Intermittent absent EDF   Chaperone: Alan Fischer    No results found for this or any previous visit (from the past 24 hours).  Assessment & Plan:  High-risk pregnancy: [id number] at [redacted]w[redacted]d with an Estimated Date of Delivery: 05/24/24      ICD-10-CM   1. Supervision of high risk pregnancy, antepartum  O09.90     2. Fetal growth restriction antepartum: 7% EFW, Intermittent absent EDF 04/18/24  O36.5990     3. GDMA2- MTF 500mg  BID  O24.415     4. Chronic hypertension affecting pregnancy: Norvasc  10 mg  O10.919     5. Trisomy 21 syndrome, likely given NIPS and sonogram findings, not definitively  diabnosed  Q90.9     6. Ventriculomegaly of brain on fetal ultrasound  O35.06X0     7. 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   Orders: No orders of the defined types were placed in this encounter.    Labs/procedures today: U/S  Treatment Plan:  To L&D for IOL, pt aware of increased risk for need for Caesarean delivery given IA EDF, avoid cytotec, as a result, discussed with Dr Arna    Follow-up: Return for pp BP check.   Future Appointments  Date Time Provider Department Center  04/22/2024  9:10 AM [facility] NURSE [facility]  04/25/2024  8:30 AM [facility] US  [facility] None  04/25/2024  9:30 AM Jayne Vonn DEL, MD [facility]  04/30/2024  9:10 AM [facility]  05/03/2024  8:30 AM [facility] US  [facility] None  05/03/2024  9:50 AM Ozan, Jennifer, DO [facility]  05/07/2024  8:50 AM [facility]  05/10/2024  8:30 AM [facility] US  [facility] None  05/10/2024  9:50 AM Marilynn Nest, DO [facility]    No orders of the defined types were placed in this encounter.  Vonn DEL Jayne  Attending Physician for the Center for [facility] 04/18/2024 10:00 AM  "

## 2024-04-18 NOTE — H&P (Cosign Needed Addendum)
 " OBSTETRIC ADMISSION HISTORY AND PHYSICAL  Hailey Holden is 39 y.o. [id number] with IUP at [redacted]w[redacted]d 05/24/2024, by Ultrasound presenting for IOL for Intermittent absent EDF. She is also being monitored for FGR (7%) and known cardiac abnormality as well as high risk for T21. Additionally, A2DM on Metformin  500mg  BID and CHTN on procardia. She received her prenatal care at [facility] at [redacted]w[redacted]d: normal anatomy, cephalic presentation, posterior placenta, EFW 1919g, (7%)  ROS (+) FM, ctx occasional (-) VB, LOF. HA, visual changes, CP, SOB, RUQ pain, peripheral edema.   Prenatal History/Complications NURSING  PROVIDER  Office Location [facility] Dating by U/S at 6 wks  [facility] Model Traditional Anatomy U/S    Initiated care at  12wks                 Language  English               LAB RESULTS   Support Person   Genetics NIPS: declined AFP: declined      NT/IT (FT only) declined      Carrier Screen Horizon: declined  Rhogam  O/Positive/-- (08/14 1614) A1C/GTT Early HgbA1C: 5.3 Third trimester 2 hr GTT:   Flu Vaccine declined      TDaP Vaccine  Prior reaction Blood Type O/Positive/-- (08/14 1614)  RSV Vaccine declined Antibody Negative (08/14 1614)  COVID Vaccine   Rubella 2.48 (08/14 1614)  Feeding Plan breast RPR Non Reactive (08/14 1614)  Contraception ?vasectomy HBsAg Negative (08/14 1614)  Circumcision yes HIV Non Reactive (08/14 1614)  Pediatrician  Cass Fam HCVAb Non Reactive (08/14 1614)  Prenatal Classes        BTL Consent   Pap       Diagnosis  Date Value Ref Range Status  04/27/2022     Final    - Negative for intraepithelial lesion or malignancy (NILM)    BTL Pre-payment   GC/CT Initial:  -/- 36wks:    VBAC Consent   GBS   For PCN allergy, check sensitivities   BRx Optimized? [ ]  yes   [ ]  no      DME Rx [ ]  BP cuff [ ]  Weight Scale Waterbirth  [ ]  Class [ ]  Consent [ ]  CNM visit  PHQ9 & GAD7 [  ] new OB [  ] 28 weeks  [  ] 36 weeks Induction  [ ]  Orders  Entered [ ] Foley Y/N   OB History  Gravida Para Term Preterm AB Living  4 2 2  1 2   SAB IAB Ectopic Multiple Live Births  1    2    # Outcome Date GA Lbr Len/2nd Weight Sex Type Anes PTL Lv  4 Current           3 SAB 2022          2 Term 04/01/10 [redacted]w[redacted]d  3289 g F Vag-Spont   LIV  1 Term 08/24/06 [redacted]w[redacted]d  3742 g M Vag-Spont   LIV   Patient Active Problem List   Diagnosis Date Noted   Indication for care in labor or delivery 04/18/2024   Fetal growth restriction antepartum 04/13/2024   Fetal cardiac anomaly affecting pregnancy, antepartum 03/08/2024   Gestational diabetes 02/28/2024   Abnormal genetic test during pregnancy 02/02/2024   Chronic hypertension affecting pregnancy 11/20/2023   Supervision of high risk pregnancy, antepartum 11/15/2023   Hyperlipidemia 09/12/2023   Hypothyroidism due to Hashimoto thyroiditis 06/15/2023   Obesity (BMI 30-39.9)  08/23/2021   Prediabetes 08/23/2021   Tachycardia    Hidradenitis 10/17/2013   Essential hypertension 09/27/2012   Asthma 09/03/2012    Past Medical History: Past Medical History:  Diagnosis Date   Allergies    Asthma    Back pain    Breast nodule 02/04/2014   Constipation    Constipation    Depression    Dizziness    Dry skin    Easy bruising    Encounter for other contraceptive management 10/21/2014   Enlarged thyroid  09/27/2012   Fatigue    Fatty liver    Fibromyalgia    Floaters in visual field    Glands swollen    Hashimoto's disease    Headache    Heat intolerance    Hidradenitis    High cholesterol    Hoarseness    Hypertension    Hypothyroidism    IBS (irritable bowel syndrome)    Joint pain    Leg cramping    Migraine headache    Miscarriage    Muscle stiffness    Myalgia    Nausea    PMS (premenstrual syndrome) 09/27/2012   Stress    Swelling of lower extremity    Tachycardia    patient states she has long history of resting tachycardia   Thyroid  nodule    Uterine prolapse 10/21/2014    Vaginal irritation 10/08/2015   Vision changes    Wheezing    Yeast infection 10/08/2015    Past Surgical History: History reviewed. No pertinent surgical history.  Social History Social History   Socioeconomic History   Marital status: Married    Spouse name: Hailey Holden   Number of children: 2   Years of education: Not on file   Highest education level: Not on file  Occupational History   Occupation: LPN     Employer: [employer]    Comment: Estate [employer]  Tobacco Use   Smoking status: Never   Smokeless tobacco: Never  Vaping Use   Vaping status: Never Used  Substance and Sexual Activity   Alcohol use: No   Drug use: No   Sexual activity: Yes    Birth control/protection: None, Coitus interruptus  Other Topics Concern   Not on file  Social History Narrative   Not on file   Social Drivers of Health   Tobacco Use: Low Risk (04/19/2024)   Patient History    Smoking Tobacco Use: Never    Smokeless Tobacco Use: Never    Passive Exposure: Not on file  Financial Resource Strain: Low Risk (11/16/2023)   Overall Financial Resource Strain (CARDIA)    Difficulty of Paying Living Expenses: Not hard at all  Food Insecurity: No Food Insecurity (04/19/2024)   Epic    Worried About Programme Researcher, Broadcasting/film/video in the Last Year: Never true    Ran Out of Food in the Last Year: Never true  Transportation Needs: No Transportation Needs (04/19/2024)   Epic    Lack of Transportation (Medical): No    Lack of Transportation (Non-Medical): No  Physical Activity: Inactive (11/16/2023)   Exercise Vital Sign    Days of Exercise per Week: 0 days    Minutes of Exercise per Session: 0 min  Stress: No Stress Concern Present (11/16/2023)   [employer] of Occupational Health - Occupational Stress Questionnaire    Feeling of Stress: Only a little  Social Connections: Socially Integrated (11/16/2023)   Social Connection and Isolation Panel    Frequency  of Communication with Friends and  Family: More than three times a week    Frequency of Social Gatherings with Friends and Family: More than three times a week    Attends Religious Services: More than 4 times per year    Active Member of Clubs or Organizations: Yes    Attends Banker Meetings: More than 4 times per year    Marital Status: Married  Depression (PHQ2-9): Low Risk (11/16/2023)   Depression (PHQ2-9)    PHQ-2 Score: 2  Alcohol Screen: Low Risk (11/16/2023)   Alcohol Screen    Last Alcohol Screening Score (AUDIT): 0  Housing: Low Risk (04/19/2024)   Epic    Unable to Pay for Housing in the Last Year: No    Number of Times Moved in the Last Year: 0    Homeless in the Last Year: No  Utilities: Not At Risk (04/18/2024)   Epic    Threatened with loss of utilities: No  Health Literacy: Not on file    Family History: Family History  Problem Relation Age of Onset   Cancer Paternal Grandfather        lung   Heart disease Paternal Grandmother    Hypertension Paternal Grandmother    Hyperlipidemia Paternal Grandmother    Other Paternal Grandmother        macular degeneration   Dementia Paternal Grandmother    Hypertension Maternal Grandmother    Vision loss Maternal Grandmother    Glaucoma Maternal Grandmother    Stroke Maternal Grandmother    Cancer Maternal Grandfather        melonoma   Heart disease Father    Hypertension Father    Hyperlipidemia Father    Obesity Father    Sleep apnea Father    Diabetes Mother    Hypertension Mother    Fibromyalgia Mother    Obesity Mother    Cancer Maternal Uncle        lung   Colon cancer Maternal Uncle    Cancer Maternal Uncle        lung   Colon cancer Maternal Uncle    Cancer Paternal Aunt        breast    Allergies: Allergies[1]  Medications Prior to Admission  Medication Sig Dispense Refill Last Dose/Taking   Accu-Chek Softclix Lancets lancets Use as instructed to check blood sugar 4 times daily 100 each 12 04/18/2024 at  8:00 AM    albuterol  (VENTOLIN  HFA) 108 (90 Base) MCG/ACT inhaler INHALE TWO PUFFS INTO THE LUNGS EVERY FOUR HOURS AS NEEDED FOR WHEEZING 6.7 g 3 04/18/2024 at  6:30 PM   amLODipine  (NORVASC ) 10 MG tablet Take 1 tablet (10 mg total) by mouth daily. 90 tablet 4 04/18/2024   aspirin  EC 81 MG tablet Take 2 tablets (162 mg total) by mouth daily. Swallow whole. 180 tablet 2 04/18/2024 at  8:00 AM   Blood Glucose Monitoring Suppl (ACCU-CHEK GUIDE ME) w/Device KIT 1 each by Does not apply route 4 (four) times daily. 1 kit 0 04/18/2024 at  8:00 AM   Cetirizine HCl 10 MG CAPS Take 10 mg by mouth daily.   04/18/2024 at  8:00 AM   Cholecalciferol (VITAMIN D ) 125 MCG (5000 UT) CAPS Take 1 capsule by mouth daily. 10,000 international units-daily   04/18/2024 at  8:00 AM   Coenzyme Q10 (CO Q 10 PO) Take by mouth.   04/18/2024 at  8:00 AM   folic acid (FOLVITE) 1 MG tablet Take 1 mg  by mouth daily.   04/18/2024 at  8:00 AM   glucose blood test strip Use as instructed to check blood sugar four times daily 100 each 12 04/18/2024 at  8:00 AM   levothyroxine  (SYNTHROID ) 25 MCG tablet TAKE ONE (1) TABLET BY MOUTH EVERY DAY BEFORE BREAKFAST 90 tablet 3 04/18/2024 at  8:00 AM   Menaquinone-7 (K2 PO) Take 100 mcg by mouth daily.   04/18/2024 at  8:00 AM   metFORMIN  (GLUCOPHAGE ) 500 MG tablet Take 1 tablet (500 mg total) by mouth 2 (two) times daily with a meal. 180 tablet 3 04/18/2024 at  8:00 AM   docusate sodium  (COLACE) 100 MG capsule Take 100 mg by mouth 2 (two) times daily. (Patient not taking: Reported on 04/12/2024)        Review of Systems  All systems reviewed and negative except as stated in HPI  PHYSICAL EXAM Blood pressure (!) 142/75, pulse (!) 108, temperature 98.3 F (36.8 C), temperature source Oral, resp. rate 14, height 5' 4 (1.626 m), weight 102.1 kg, last menstrual period 05/21/2023. General appearance: alert and cooperative Lungs: respirations nonlabored Heart: regular rate 109 Abdomen: gravid  Fetal  monitoringBaseline: 130 bpm, Variability: Good {> 6 bpm), Accelerations: Reactive, and Decelerations: Absent Uterine activity occasional  Presentation: cephalic   Prenatal labs: ABO, Rh: --/--/O POS (01/15 1245) Antibody: NEG (01/15 1245) Rubella: 2.48 (08/14 1614) RPR: NON REACTIVE (01/15 1244)  HBsAg: Negative (08/14 1614)  HIV: Non Reactive (11/25 0807)   Lab Results  Component Value Date   GBS PRESUMPTIVE NEGATIVE 04/18/2024   (PCR, so disregard results and will continue to treat; culture not obtained)   There is no immunization history on file for this patient.  Prenatal Transfer Tool  Maternal Diabetes: Yes, oral medication controlled Genetic Screening: NIPT HR for T21 Maternal Ultrasounds/Referrals: Cardiac defect, FGR 7% Fetal Ultrasounds or other Referrals:  [facility]:  Echo (see report under media tab12/5/25) Maternal Substance Abuse:  No Significant Maternal Medications:  Synthroid , metformin  Significant Maternal Lab Results: Other:  GBS unknown, treated w/Ancef  (PCR was neg, but baby is preterm so invalid results; culture not obtained) Number of Prenatal Visits:greater than 3 verified prenatal visits Maternal Vaccinations:TDap- previous reaction Other Comments:  None   Results for orders placed or performed during the hospital encounter of 04/18/24 (from the past 24 hours)  CBC   Collection Time: 04/18/24 12:44 PM  Result Value Ref Range   WBC 13.0 (H) 4.0 - 10.5 K/uL   RBC 4.61 3.87 - 5.11 MIL/uL   Hemoglobin 10.9 (L) 12.0 - 15.0 g/dL   HCT 65.3 (L) 63.9 - 53.9 %   MCV 75.1 (L) 80.0 - 100.0 fL   MCH 23.6 (L) 26.0 - 34.0 pg   MCHC 31.5 30.0 - 36.0 g/dL   RDW 84.7 88.4 - 84.4 %   Platelets 352 150 - 400 K/uL   nRBC 0.0 0.0 - 0.2 %  RPR   Collection Time: 04/18/24 12:44 PM  Result Value Ref Range   RPR Ser Ql NON REACTIVE NON REACTIVE  Comprehensive metabolic panel   Collection Time: 04/18/24 12:44 PM  Result Value Ref Range   Sodium  136 135 - 145 mmol/L   Potassium 3.7 3.5 - 5.1 mmol/L   Chloride 103 98 - 111 mmol/L   CO2 18 (L) 22 - 32 mmol/L   Glucose, Bld 105 (H) 70 - 99 mg/dL   BUN 5 (L) 6 - 20 mg/dL   Creatinine, Ser 9.42 0.44 -  1.00 mg/dL   Calcium  9.1 8.9 - 10.3 mg/dL   Total Protein 7.0 6.5 - 8.1 g/dL   Albumin 3.4 (L) 3.5 - 5.0 g/dL   AST 19 15 - 41 U/L   ALT 13 0 - 44 U/L   Alkaline Phosphatase 242 (H) 38 - 126 U/L   Total Bilirubin 0.5 0.0 - 1.2 mg/dL   GFR, Estimated >39 >39 mL/min   Anion gap 16 (H) 5 - 15  Type and screen [facility]   Collection Time: 04/18/24 12:45 PM  Result Value Ref Range   ABO/RH(D) O POS    Antibody Screen NEG    Sample Expiration      04/21/2024,2359 Performed at [facility], [address] [place], [place] [address]   Protein / creatinine ratio, urine   Collection Time: 04/18/24  1:15 PM  Result Value Ref Range   Creatinine, Urine 181 mg/dL   Total Protein, Urine 43 mg/dL   Protein Creatinine Ratio 0.2 (H) <0.2 mg/mg  Group B strep by PCR   Collection Time: 04/18/24  4:07 PM   Specimen: Vaginal/Rectal; Genital  Result Value Ref Range   Group B strep by PCR PRESUMPTIVE NEGATIVE PRESUMPTIVE NEGATIVE  Glucose, capillary   Collection Time: 04/18/24  8:35 PM  Result Value Ref Range   Glucose-Capillary 78 70 - 99 mg/dL  Glucose, capillary   Collection Time: 04/19/24 12:27 AM  Result Value Ref Range   Glucose-Capillary 104 (H) 70 - 99 mg/dL  Glucose, capillary   Collection Time: 04/19/24  4:32 AM  Result Value Ref Range   Glucose-Capillary 105 (H) 70 - 99 mg/dL  Glucose, capillary   Collection Time: 04/19/24  8:14 AM  Result Value Ref Range   Glucose-Capillary 89 70 - 99 mg/dL    Patient Active Problem List   Diagnosis Date Noted   Indication for care in labor or delivery 04/18/2024   Fetal growth restriction antepartum 04/13/2024   Fetal cardiac anomaly affecting pregnancy, antepartum 03/08/2024   Gestational diabetes 02/28/2024    Abnormal genetic test during pregnancy 02/02/2024   Chronic hypertension affecting pregnancy 11/20/2023   Supervision of high risk pregnancy, antepartum 11/15/2023   Hyperlipidemia 09/12/2023   Hypothyroidism due to Hashimoto thyroiditis 06/15/2023   Obesity (BMI 30-39.9) 08/23/2021   Prediabetes 08/23/2021   Tachycardia    Hidradenitis 10/17/2013   Essential hypertension 09/27/2012   Asthma 09/03/2012    ASSESSMENT & PLAN SHACARRA CHOE is 39 y.o. [id number] with IUP at [redacted]w[redacted]d 05/24/2024, by Ultrasound admitted for IOL for Intermittent absent EDF. She is also being monitored for FGR (7%) , CHTN and known cardiac abnormality as well as high risk for T21. A2DM on metformin  500mg  BID, well controlled  [facility] at [redacted]w[redacted]d: normal anatomy, cephalic presentation, posterior placenta, EFW 1919g, (7%)  #Labor: Consider FB and low dose pitocin  #Pain: Per patient preference, encourage ambulation #FWB: Cat 1  #FGR: Avoid cytotec.  #GBS status:  Ancef  #Feeding: Breastmilk  #Reproductive Life planning: Vasectomy #Circ:  yes   Conard Me, SNM, RNC-OB Student Nurse Midwife 04/19/2024 8:46 AM   Evaluation and management procedures were performed by the Family Medicine Resident/PA or Medical Student/Student Nurse Midwife under my supervision. I was immediately available for direct supervision, assistance and direction throughout this encounter.  I also confirm that I have verified the information documented in the residents note, and that I have also personally reperformed the pertinent components of the physical exam and all of the medical decision  making activities.  I have also made any necessary editorial changes.  Cathlean Ely, CNM [place] 04/19/2024 9:01 AM      [1]  Allergies Allergen Reactions   Clindamycin /Lincomycin Shortness Of Breath and Rash   Tdap [Tetanus-Diphth-Acell Pertussis] Itching    Itching, swelling in the arm, chest  tightness, wheezing, headache, SOB   Latex    Penicillins    "

## 2024-04-18 NOTE — Progress Notes (Signed)
 Patient Vitals for the past 4 hrs:  BP Pulse  04/18/24 2330 (!) 145/80 (!) 115  04/18/24 2322 (!) 151/90 (!) 129  04/18/24 2312 (!) 167/88 (!) 126  04/18/24 2300 (!) 160/89 (!) 122  04/18/24 2230 (!) 142/88 (!) 112  04/18/24 2200 138/84 (!) 129  04/18/24 2130 136/89 (!) 114  04/18/24 2100 (!) 138/91 (!) 118  04/18/24 2030 132/86 (!) 101   BP at 2300 taken during a ctx. Ctx are definitely getting stronger, breathing through them. Cx 5/60/-2.  AROM w/clear fluid . Pitocin  at 6 mu/min, ctx q 1-3 minutes. FHR Cat 1.  Continue present mgt, watch BP closely. If has another severe range, will start magnesium.  Blood sugar 78

## 2024-04-18 NOTE — Progress Notes (Signed)
 US  34+6 wks,cephalic,posterior placenta gr 3,FHR 137 bpm,AFI 19.5 cm,elevated UAD with intermittent absent EDF, RI .97,.95,.99,.88,BPP 8/8,known cardiac abnormality,dilated lateral ventricles,LV 1.3 cm,RV 1.6 cm

## 2024-04-18 NOTE — Progress Notes (Signed)
 Labor Progress Note Hailey Holden is a 39 y.o. H5E7987 at [redacted]w[redacted]d presented for IOL for Intermittent absent EDF. She is also being monitored for FGR (7%) and known cardiac abnormality as well as high risk for T21.    S:  Coping well.  O:  BP 132/82   Pulse (!) 104   Resp 16   LMP 05/21/2023 (Exact Date)   EFM: baseline 140 bpm/ moderate variability/ 15x15 accels/ no decels  Toco/IUPC: none  SVE: Dilation: 1,thick,ballotable Presentation: Vertex Exam by:: Pearl Bents,snm   A/P: 39 y.o. [id number] [redacted]w[redacted]d  1. Labor: RBA of foley balloon discussed. Patient agreeable. Placed with ease with 60cc. Start low dose pit to stop at 6 while foley balloon in place. 2. FWB: Cat 1 3. Pain: per patient preference. 4. FGR: Low dose pitocin  for cervical ripening. 5. GBS pending   Anticipate SVB.  Hanford Lust VEAR Me, Student-MidWife 4:06 PM

## 2024-04-19 ENCOUNTER — Encounter (HOSPITAL_COMMUNITY): Payer: Self-pay | Admitting: Family Medicine

## 2024-04-19 LAB — GLUCOSE, CAPILLARY
Glucose-Capillary: 104 mg/dL — ABNORMAL HIGH (ref 70–99)
Glucose-Capillary: 105 mg/dL — ABNORMAL HIGH (ref 70–99)
Glucose-Capillary: 89 mg/dL (ref 70–99)

## 2024-04-19 LAB — SYPHILIS: RPR W/REFLEX TO RPR TITER AND TREPONEMAL ANTIBODIES, TRADITIONAL SCREENING AND DIAGNOSIS ALGORITHM: RPR Ser Ql: NONREACTIVE

## 2024-04-19 MED ORDER — IBUPROFEN 600 MG PO TABS
600.0000 mg | ORAL_TABLET | Freq: Four times a day (QID) | ORAL | Status: DC
  Administered 2024-04-19 – 2024-04-21 (×7): 600 mg via ORAL
  Filled 2024-04-19 (×8): qty 1

## 2024-04-19 MED ORDER — SIMETHICONE 80 MG PO CHEW: 80.0000 mg | CHEWABLE_TABLET | ORAL | Status: DC | PRN

## 2024-04-19 MED ORDER — LABETALOL HCL 200 MG PO TABS
200.0000 mg | ORAL_TABLET | Freq: Two times a day (BID) | ORAL | Status: DC
  Administered 2024-04-20 – 2024-04-21 (×4): 200 mg via ORAL
  Filled 2024-04-19 (×6): qty 1

## 2024-04-19 MED ORDER — METHYLERGONOVINE MALEATE 0.2 MG/ML IJ SOLN: 0.2000 mg | INTRAMUSCULAR | Status: DC | PRN

## 2024-04-19 MED ORDER — ACETAMINOPHEN 325 MG PO TABS: 650.0000 mg | ORAL_TABLET | ORAL | Status: DC | PRN

## 2024-04-19 MED ORDER — DIBUCAINE (PERIANAL) 1 % EX OINT: 1.0000 | TOPICAL_OINTMENT | CUTANEOUS | Status: DC | PRN

## 2024-04-19 MED ORDER — BISACODYL 10 MG RE SUPP: 10.0000 mg | Freq: Every day | RECTAL | Status: DC | PRN

## 2024-04-19 MED ORDER — SENNOSIDES-DOCUSATE SODIUM 8.6-50 MG PO TABS
2.0000 | ORAL_TABLET | ORAL | Status: DC
  Administered 2024-04-19 – 2024-04-20 (×2): 2 via ORAL
  Filled 2024-04-19 (×2): qty 2

## 2024-04-19 MED ORDER — ONDANSETRON HCL 4 MG/2ML IJ SOLN: 4.0000 mg | INTRAMUSCULAR | Status: DC | PRN

## 2024-04-19 MED ORDER — WITCH HAZEL-GLYCERIN EX PADS: 1.0000 | MEDICATED_PAD | CUTANEOUS | Status: DC | PRN

## 2024-04-19 MED ORDER — FUROSEMIDE 20 MG PO TABS
20.0000 mg | ORAL_TABLET | Freq: Every day | ORAL | Status: DC
  Administered 2024-04-19 – 2024-04-21 (×3): 20 mg via ORAL
  Filled 2024-04-19 (×3): qty 1

## 2024-04-19 MED ORDER — MEDROXYPROGESTERONE ACETATE 150 MG/ML IM SUSP: 150.0000 mg | INTRAMUSCULAR | Status: DC | PRN

## 2024-04-19 MED ORDER — PRENATAL MULTIVITAMIN CH
1.0000 | ORAL_TABLET | Freq: Every day | ORAL | Status: DC
  Administered 2024-04-19 – 2024-04-21 (×3): 1 via ORAL
  Filled 2024-04-19 (×3): qty 1

## 2024-04-19 MED ORDER — BENZOCAINE-MENTHOL 20-0.5 % EX AERO: 1.0000 | INHALATION_SPRAY | CUTANEOUS | Status: DC | PRN

## 2024-04-19 MED ORDER — POTASSIUM CHLORIDE CRYS ER 20 MEQ PO TBCR
20.0000 meq | EXTENDED_RELEASE_TABLET | Freq: Every day | ORAL | Status: DC
  Administered 2024-04-19 – 2024-04-21 (×3): 20 meq via ORAL
  Filled 2024-04-19 (×3): qty 1

## 2024-04-19 MED ORDER — OXYCODONE HCL 5 MG PO TABS: 5.0000 mg | ORAL_TABLET | ORAL | Status: DC | PRN

## 2024-04-19 MED ORDER — DIPHENHYDRAMINE HCL 25 MG PO CAPS: 25.0000 mg | ORAL_CAPSULE | Freq: Four times a day (QID) | ORAL | Status: DC | PRN

## 2024-04-19 MED ORDER — COCONUT OIL OIL
1.0000 | TOPICAL_OIL | Status: DC | PRN
  Administered 2024-04-19: 1 via TOPICAL

## 2024-04-19 MED ORDER — FERROUS SULFATE 325 (65 FE) MG PO TABS
325.0000 mg | ORAL_TABLET | ORAL | Status: DC
  Administered 2024-04-19 – 2024-04-21 (×2): 325 mg via ORAL
  Filled 2024-04-19 (×2): qty 1

## 2024-04-19 MED ORDER — ONDANSETRON HCL 4 MG PO TABS: 4.0000 mg | ORAL_TABLET | ORAL | Status: DC | PRN

## 2024-04-19 MED ORDER — FLEET ENEMA RE ENEM: 1.0000 | ENEMA | Freq: Every day | RECTAL | Status: DC | PRN

## 2024-04-19 MED ORDER — METHYLERGONOVINE MALEATE 0.2 MG PO TABS: 0.2000 mg | ORAL_TABLET | ORAL | Status: DC | PRN

## 2024-04-19 MED ORDER — MEASLES, MUMPS & RUBELLA VAC ~~LOC~~ SUSR
0.5000 mL | Freq: Once | SUBCUTANEOUS | Status: DC
  Filled 2024-04-19: qty 0.5

## 2024-04-19 NOTE — Lactation Note (Addendum)
 This note was copied from a baby'Hailey chart.  NICU Lactation Consultation Note  Patient Name: Hailey Holden Date: 04/19/2024 Age:40 hours  Reason for consult: Initial assessment; Other (Comment); Maternal endocrine disorder; Infant < 6lbs; Late-preterm 34-36.6wks (cHTN, AMA) Type of Endocrine Disorder?: Diabetes; Thyroid  (Hashimoto (hypothyroid), A2GDM (metformin ))  SUBJECTIVE Visited with family of 69 105/62 weeks old NICU female; baby Hailey Holden got admitted due to prematurity. Hailey Holden is a P3 and experienced breastfeeding, but her last child is now 65 y.o. She has already initiated pumping and able to collect colostrum, praised her for all her efforts. Her plan is to do both, direct breastfeeding along with pumping and bottle feeding.  She has a pumping bra at home, offered a hands-free pumping top from the hospital, she voiced we could do it tomorrow since she had visitors in the room. Reviewed pumping schedule, pump settings, pumping log, secretory activation, lactogenesis III, CDC and anticipatory guidelines. Explained that the importance of pumping this early on is mainly for breast stimulation and not to get volume, she voiced understanding.   OBJECTIVE Infant data: Mother'Hailey Current Feeding Choice: Breast Milk and Donor Milk  O2 Device: Room Air  Maternal data: [id number] Vaginal, Spontaneous Has patient been taught Hand Expression?: Yes Significant Breast History:: (+) breast changes during the pregnancy Current breast feeding challenges:: NICU admission Does the patient have breastfeeding experience prior to this delivery?: Yes How long did the patient breastfeed?: 1st one for 6 weeks, 2nd for 11 months Pumping frequency: initiated pumping at 3 hours post-partum Pumped volume: 1 mL Flange Size: 24 Risk factor for low/delayed milk supply:: prematurity, AMA, cHTN, hypothyroid, A2GDM  Pump: Personal, DEBP, Referral sent for Stork Pump (Unsure of  brand)  ASSESSMENT Infant: Feeding Status: Scheduled 9-12-3-6 Feeding method: Tube/Gavage (Bolus)  Maternal: Milk volume: Normal  INTERVENTIONS/PLAN Interventions: Interventions: Breast feeding basics reviewed; Coconut oil; DEBP; Education; [employer] Services brochure; CDC milk storage guidelines; CDC Guidelines for Breast Pump Cleaning; NICU Pumping Log Tools: Pump; Flanges; Coconut oil Pump Education: Setup, frequency, and cleaning; Milk Storage  Plan: STS around care times Breast massage, hand expression and coconut oil prior to pumping Pump both breasts on initiate mode every 3 hours for 15 minutes; ideally 8 pumping sessions/24 hours Verify stork pump issuance  FOB and older children present. All questions and concerns answered, family to contact [facility] services PRN.  Consult Status: NICU follow-up NICU Follow-up type: New admission follow up   Hailey Holden Hailey Holden 04/19/2024, 5:58 PM

## 2024-04-19 NOTE — Discharge Summary (Signed)
 Postpartum Discharge Summary  Patient Name: Hailey Holden DOB: April 19, 1985 MRN: [id number]  Date of admission: 04/18/2024 Delivery date:04/19/2024 Delivering provider: NEWTON MERING Date of discharge: 04/21/2024  Admitting diagnosis: Indication for care in labor or delivery [O75.9] Intrauterine pregnancy: [redacted]w[redacted]d     Secondary diagnosis:  Principal Problem:   Indication for care in labor or delivery Active Problems:   Vaginal delivery  Additional problems: CHTN, A2DM, hypothyroid, fetal growth restriction    Discharge diagnosis: Preterm Pregnancy Delivered, CHTN, and GDM A2                                              Post partum procedures:None  Augmentation: AROM, Pitocin , and IP Foley Complications: None  Hospital course: Induction of Labor With Vaginal Delivery   39 y.o. yo [phone] at [redacted]w[redacted]d was admitted to the hospital 04/18/2024 for induction of labor.  Indication for induction: AED dopplers.  Patient had an labor course complicated by precipitous delivery Membrane Rupture Time/Date: 11:16 PM,04/18/2024  Delivery Method:Vaginal, Spontaneous Operative Delivery:N/A Episiotomy: None Lacerations:  None Details of delivery can be found in separate delivery note.  Patient had an uncomplicated postpartum course. Due to h/o cHTN and elevated blood pressures in postpartum, she was started on amlodipine , in addition labetalol  was added to he regimen and she will continue on these medications at home as well as Lasix  x 5 days and Kdur x 5 days per antihypertensive protocol. Hgb: 10.9 (04/18/2024). Patient was started on oral iron therapy for clinically significant but asymptomatic acute postoperative anemia due to expected blood loss.  Patient is discharged home 04/21/24.  Newborn Data: Birth date:04/19/2024 Birth time:10:52 AM Gender:Female Living status:Living Apgars:9 ,9  Weight:2440 g  Magnesium Sulfate received: No BMZ received: No Rhophylac:N/A MMR:N/A T-DaP:previous  reaction Flu: No RSV Vaccine received: No Transfusion:No  Immunizations received: There is no immunization history for the selected administration types on file for this patient.  Physical exam  Vitals:   04/20/24 2052 04/20/24 2154 04/21/24 0555 04/21/24 1025  BP: 119/66 119/66 121/78 134/76  Pulse: (!) 103 (!) 103 88 98  Resp:      Temp: 98.2 F (36.8 C)  98.3 F (36.8 C)   TempSrc: Axillary  Oral   SpO2: 98%  97%   Weight:      Height:       General: alert, cooperative, and no distress Lochia: appropriate Uterine Fundus: firm DVT Evaluation: No evidence of DVT seen on physical exam. Labs: Lab Results  Component Value Date   WBC 13.0 (H) 04/18/2024   HGB 10.9 (L) 04/18/2024   HCT 34.6 (L) 04/18/2024   MCV 75.1 (L) 04/18/2024   PLT 352 04/18/2024      Latest Ref Rng & Units 04/18/2024   12:44 PM  CMP  Glucose 70 - 99 mg/dL 894   BUN 6 - 20 mg/dL 5   Creatinine 9.55 - 8.99 mg/dL 9.42   Sodium 864 - 854 mmol/L 136   Potassium 3.5 - 5.1 mmol/L 3.7   Chloride 98 - 111 mmol/L 103   CO2 22 - 32 mmol/L 18   Calcium  8.9 - 10.3 mg/dL 9.1   Total Protein 6.5 - 8.1 g/dL 7.0   Total Bilirubin 0.0 - 1.2 mg/dL 0.5   Alkaline Phos 38 - 126 U/L 242   AST 15 - 41 U/L 19   ALT 0 -  44 U/L 13    Edinburgh Score:    04/19/2024    4:40 PM  Edinburgh Postnatal Depression Scale Screening Tool  I have been able to laugh and see the funny side of things. 0  I have looked forward with enjoyment to things. 0  I have blamed myself unnecessarily when things went wrong. 0  I have been anxious or worried for no good reason. 0  I have felt scared or panicky for no good reason. 0  Things have been getting on top of me. 0  I have been so unhappy that I have had difficulty sleeping. 0  I have felt sad or miserable. 0  I have been so unhappy that I have been crying. 0  The thought of harming myself has occurred to me. 0  Edinburgh Postnatal Depression Scale Total 0   No data  recorded  After visit meds:  Allergies as of 04/21/2024       Reactions   Clindamycin /lincomycin Shortness Of Breath, Rash   Tdap [tetanus-diphth-acell Pertussis] Itching   Itching, swelling in the arm, chest tightness, wheezing, headache, SOB   Latex    Penicillins         Medication List     STOP taking these medications    Accu-Chek Guide Me w/Device Kit   Accu-Chek Softclix Lancets lancets   aspirin  EC 81 MG tablet   CO Q 10 PO   docusate sodium  100 MG capsule Commonly known as: COLACE   folic acid 1 MG tablet Commonly known as: FOLVITE   glucose blood test strip   K2 PO   metFORMIN  500 MG tablet Commonly known as: GLUCOPHAGE    Vitamin D  125 MCG (5000 UT) Caps       TAKE these medications    acetaminophen  325 MG tablet Commonly known as: Tylenol  Take 2 tablets (650 mg total) by mouth every 4 (four) hours as needed (for pain scale < 4).   albuterol  108 (90 Base) MCG/ACT inhaler Commonly known as: VENTOLIN  HFA INHALE TWO PUFFS INTO THE LUNGS EVERY FOUR HOURS AS NEEDED FOR WHEEZING   amLODipine  10 MG tablet Commonly known as: Norvasc  Take 1 tablet (10 mg total) by mouth daily. What changed: Another medication with the same name was added. Make sure you understand how and when to take each.   amLODipine  10 MG tablet Commonly known as: NORVASC  Take 1 tablet (10 mg total) by mouth daily. What changed: You were already taking a medication with the same name, and this prescription was added. Make sure you understand how and when to take each.   benzocaine -Menthol  20-0.5 % Aero Commonly known as: DERMOPLAST Apply 1 Application topically as needed for irritation (perineal discomfort).   Cetirizine HCl 10 MG Caps Take 10 mg by mouth daily.   coconut oil Oil Apply 1 Application topically as needed.   ferrous sulfate  325 (65 FE) MG tablet Take 1 tablet (325 mg total) by mouth every other day. Start taking on: April 23, 2024   furosemide  20 MG  tablet Commonly known as: LASIX  Take 1 tablet (20 mg total) by mouth daily. Start taking on: April 22, 2024   ibuprofen  600 MG tablet Commonly known as: ADVIL  Take 1 tablet (600 mg total) by mouth every 6 (six) hours as needed.   labetalol  200 MG tablet Commonly known as: NORMODYNE  Take 1 tablet (200 mg total) by mouth 2 (two) times daily.   levothyroxine  25 MCG tablet Commonly known as: SYNTHROID  TAKE ONE (1)  TABLET BY MOUTH EVERY DAY BEFORE BREAKFAST   potassium chloride  SA 20 MEQ tablet Commonly known as: KLOR-CON  M Take 1 tablet (20 mEq total) by mouth daily. Start taking on: April 22, 2024   prenatal multivitamin Tabs tablet Take 1 tablet by mouth daily. Start taking on: April 22, 2024   senna-docusate 8.6-50 MG tablet Commonly known as: Senokot-S Take 2 tablets by mouth daily.   witch hazel-glycerin  pad Commonly known as: TUCKS Apply 1 Application topically as needed for hemorrhoids.       Discharge home in stable condition Infant Feeding: Breastfeeding and bottle feeding  Infant Disposition:NICU Discharge instruction: per After Visit Summary and Postpartum booklet. Activity: Advance as tolerated. Pelvic rest for 6 weeks.  Diet: routine diet Future Appointments: Future Appointments  Date Time Provider Department Center  04/26/2024 10:30 AM [facility] NURSE [facility]  05/29/2024 10:30 AM Loreli Suzen BIRCH, CNM [facility]   Follow up Visit:   Please schedule this patient for a In person postpartum visit in 6 weeks with the following provider: Any provider. Additional Postpartum F/U:BP check 1 week  High risk pregnancy complicated by: GDM, HTN, and FGR Delivery mode:  Vaginal, Spontaneous Anticipated Birth Control:  vasectomy  6 week pp Visit scheduled for 05/29/2024 1 week BP check scheduled for 04/26/2024   04/21/2024 Jazmine Liboon, CNM

## 2024-04-19 NOTE — Progress Notes (Signed)
 Set patient up with DEBP. Educated patient on set up, cleaning and frequency of pumping. Patient verbalizes understanding. Tammy, Steva [place]

## 2024-04-19 NOTE — Progress Notes (Signed)
 Patient Vitals for the past 4 hrs:  BP Temp Temp src Pulse Resp  04/19/24 0530 (!) 154/90 -- -- (!) 108 --  04/19/24 0500 (!) 142/82 -- -- (!) 107 --  04/19/24 0430 (!) 148/86 -- -- (!) 120 --  04/19/24 0400 (!) 146/87 99.1 F (37.3 C) Axillary (!) 110 20  04/19/24 0330 (!) 151/84 -- -- (!) 115 --  04/19/24 0300 135/67 -- -- (!) 105 --  04/19/24 0230 138/89 -- -- (!) 107 --  04/19/24 0200 (!) 152/88 97.9 F (36.6 C) Oral (!) 105 20   Blood sugar: 104/105  FHR 150, mod variability, + accels, some mild variable decels.  IUPC placed about 30 minutes ago, MVUs 210, pitocin  at 10 mu/min.  Cx w/minimal change at this point, 6/70/-3. Continue present mgt, consider amnioinfusion if variables become moderate.

## 2024-04-19 NOTE — Progress Notes (Signed)
 Notified Dr. Jomarie regarding patient's blood pressures in the postpartum couplet care room. So far, BP's have been trending lower than BPs in L&D; however, still >135/85. No new orders at this time. Patient received amlodipine  this morning in L&D and will soon receive lasix  as well. Tammy, Steva [place]

## 2024-04-20 NOTE — Lactation Note (Deleted)
 This note was copied from a baby's chart. Lactation Consultation Note  Patient Name: Hailey Holden Unijb'd Date: 04/20/2024 Age:40 hours Reason for consult: Follow-up assessment;NICU baby;Late-preterm 34-36.6wks;Infant < 6lbs;Maternal endocrine disorder;Other (Comment) (AMA, CHTN, high risk for Trisomy 21)  LC unable to consult with Mom before she left the NICU after being discharged.  Mom told RN that she was sort of pumping.    LC set up a washing and a drying basin, labeled and placed the disassembled pump parts on clean paper towel to dry.  Lactation to F/U 1/18  Lactation Tools Discussed/Used Tools: Pump;Flanges;Hands-free pumping top Flange Size: 21 Breast pump type: Double-Electric Breast Pump Pump Education: Setup, frequency, and cleaning;Milk Storage Reason for Pumping: support milk supply Pumping frequency: pumped 3 times yesterday and assisted to pump for the 4th time today.  Encouraged pumping 8 times per 24 hrs Pumped volume: 0 mL (drops)  Interventions Interventions: Skin to skin;Breast massage;Hand express;DEBP;Education  Discharge Pump: Received Stork Pump (Spectra )  Consult Status Consult Status: NICU follow-up Date: 04/20/24 Follow-up type: In-patient    Claudene Aleck BRAVO RN [facility] 04/20/2024, 3:56 PM

## 2024-04-20 NOTE — Lactation Note (Signed)
 This note was copied from a baby's chart.  NICU Lactation Consultation Note  Patient Name: Hailey Holden Date: 04/20/2024 Age:40 hours  Reason for consult: Follow-up assessment; NICU baby; Late-preterm 34-36.6wks; Infant < 6lbs; Maternal endocrine disorder; Other (Comment) (CHTN, high risk for Trisomy 48) Type of Endocrine Disorder?: Diabetes; Thyroid  (GDM, hypothyroidism) AMA  SUBJECTIVE  LC in to visit with P3 Mom of LPTI Hailey Holden admitted to the NICU for prematurity and suspected trisomy 60.  Baby is on room air and receiving scheduled gavage feedings of DBM.  Mom encouraged to do STS with baby as she has been holding baby, but not STS.  Talked about the benefits of STS.  LC provided Mom with a pumping top for hands on pumping.  LC changed flange size to 21 for a better fit presently.  Mom encouraged to pump with a goal of 8 times per 24 hrs.   LC provided with a washing and drying basin, labeled and reviewed importance of disassembling pump parts, washing, rinsing and air drying in separate basin.     OBJECTIVE Infant data: Mother's Current Feeding Choice: Breast Milk and Donor Milk  O2 Device: Room Air  Infant feeding assessment IDFS - Readiness: 3 IDFS - Quality: -- (lick and learn, no nutritive sucking)   Maternal data: [id number] Vaginal, Spontaneous Has patient been taught Hand Expression?: Yes Significant Breast History:: (+) breast changes during the pregnancy Current breast feeding challenges:: NICU admission Does the patient have breastfeeding experience prior to this delivery?: Yes How long did the patient breastfeed?: 1st one for 6 weeks, 2nd for 11 months Pumping frequency: pumped 3 times yesterday and assisted to pump for the 4th time today.  Encouraged pumping 8 times per 24 hrs Pumped volume: 0 mL (drops) Flange Size: 21 Hands-free pumping top sizes: Small/Medium (Blue) Risk factor for low/delayed milk supply:: prematurity, AMA, cHTN, hypothyroid,  A2GDM  Pump: Received Stork Pump (Spectra )  ASSESSMENT Infant:  Feeding Status: Scheduled 9-12-3-6 Feeding method: Tube/Gavage (Bolus) Nipple Type: Lick and learn  Maternal: Milk volume: Normal  INTERVENTIONS/PLAN Interventions: Interventions: Skin to skin; Breast massage; Hand express; DEBP; Education Tools: Pump; Flanges; Hands-free pumping top Pump Education: Setup, frequency, and cleaning; Milk Storage  Plan: Consult Status: NICU follow-up NICU Follow-up type: Maternal D/C visit; Verify onset of copious milk; Verify absence of engorgement   Claudene Aleck BRAVO 04/20/2024, 3:11 PM

## 2024-04-20 NOTE — Progress Notes (Signed)
 RN took patient's blood pressure prior to scheduled norvasc , labetalol  and lasix .  BP was 130/77, pulse 87.  RN notified Dr. Jomarie as she was rounding on the patient to ensure she still wanted to give all of the blood pressure medication, given patient's BP.  She stated that she wants to continue with giving norvasc , labetalol  and lasix  at this time and we will continue to monitor patients blood pressure.  Hailey Holden N

## 2024-04-20 NOTE — Progress Notes (Signed)
 POSTPARTUM PROGRESS NOTE  Post Partum Day 1  Subjective:  BROOKLYN ALFREDO is a 39 y.o. [phone] s/p SVD at [redacted]w[redacted]d.  She reports she is doing well. No acute events overnight. She denies any problems with ambulating, voiding or po intake. Denies nausea or vomiting.  Pain is well controlled.  Lochia is Normal.  Objective: Blood pressure 130/77, pulse 87, temperature 98.4 F (36.9 C), temperature source Oral, resp. rate 18, height 5' 4 (1.626 m), weight 102.1 kg, last menstrual period 05/21/2023, SpO2 98%, unknown if currently breastfeeding.  BP Readings from Last 3 Encounters:  04/20/24 130/77  04/18/24 139/88  04/15/24 121/82    Physical Exam:  General: alert, cooperative and no distress Chest: no respiratory distress Heart:regular rate, distal pulses intact Uterine Fundus: firm, appropriately tender DVT Evaluation: No calf swelling or tenderness Extremities: non-pitting edema Skin: warm, dry  Recent Labs    04/18/24 1244  HGB 10.9*  HCT 34.6*    Assessment/Plan: ANUREET BRUINGTON is a 39 y.o. [phone] s/p NSVD at [redacted]w[redacted]d   PPD# 1 - Doing well  Routine postpartum care  Delivery Complications: chronic hypertension and gestational diabetes class A2 Blood Pressure: Elevated, currently on labetalol  200mg  TID, Norvasc  10mg , lasix  and K+ Hb Status: Stable, appropriate postpartum Hb, no intervention indicated Contraception: Vasectomy Feeding: breast feeding Needs Lactation Consultation: Yes   Dispo: Plan for discharge tomorrow.   LOS: 2 days   Charlie DELENA Courts, MD OB Fellow  04/20/2024, 12:33 PM

## 2024-04-21 ENCOUNTER — Other Ambulatory Visit (HOSPITAL_COMMUNITY): Payer: Self-pay

## 2024-04-21 MED ORDER — POTASSIUM CHLORIDE CRYS ER 20 MEQ PO TBCR
20.0000 meq | EXTENDED_RELEASE_TABLET | Freq: Every day | ORAL | 0 refills | Status: AC
  Filled 2024-04-21: qty 5, 5d supply, fill #0

## 2024-04-21 MED ORDER — ACETAMINOPHEN 325 MG PO TABS
650.0000 mg | ORAL_TABLET | ORAL | 0 refills | Status: AC | PRN
  Filled 2024-04-21: qty 30, 3d supply, fill #0

## 2024-04-21 MED ORDER — FERROUS SULFATE 325 (65 FE) MG PO TABS
325.0000 mg | ORAL_TABLET | ORAL | 0 refills | Status: AC
  Filled 2024-04-21: qty 15, 30d supply, fill #0

## 2024-04-21 MED ORDER — IBUPROFEN 600 MG PO TABS
600.0000 mg | ORAL_TABLET | Freq: Four times a day (QID) | ORAL | 0 refills | Status: AC | PRN
  Filled 2024-04-21: qty 30, 8d supply, fill #0

## 2024-04-21 MED ORDER — PRENATAL MULTIVITAMIN CH: 1.0000 | ORAL_TABLET | Freq: Every day | ORAL | Status: AC

## 2024-04-21 MED ORDER — AMLODIPINE BESYLATE 10 MG PO TABS
10.0000 mg | ORAL_TABLET | Freq: Every day | ORAL | 11 refills | Status: AC
  Filled 2024-04-21: qty 30, 30d supply, fill #0

## 2024-04-21 MED ORDER — LABETALOL HCL 200 MG PO TABS
200.0000 mg | ORAL_TABLET | Freq: Two times a day (BID) | ORAL | 0 refills | Status: AC
  Filled 2024-04-21: qty 60, 30d supply, fill #0

## 2024-04-21 MED ORDER — COCONUT OIL OIL: 1.0000 | TOPICAL_OIL | Status: AC | PRN

## 2024-04-21 MED ORDER — SENNOSIDES-DOCUSATE SODIUM 8.6-50 MG PO TABS: 2.0000 | ORAL_TABLET | ORAL | Status: AC

## 2024-04-21 MED ORDER — BENZOCAINE-MENTHOL 20-0.5 % EX AERO: 1.0000 | INHALATION_SPRAY | CUTANEOUS | Status: AC | PRN

## 2024-04-21 MED ORDER — FUROSEMIDE 20 MG PO TABS
20.0000 mg | ORAL_TABLET | Freq: Every day | ORAL | 0 refills | Status: AC
  Filled 2024-04-21: qty 5, 5d supply, fill #0

## 2024-04-21 MED ORDER — WITCH HAZEL-GLYCERIN EX PADS
1.0000 | MEDICATED_PAD | CUTANEOUS | 12 refills | Status: AC | PRN
  Filled 2024-04-21: qty 40, 10d supply, fill #0

## 2024-04-21 NOTE — Discharge Instructions (Signed)
 Please take your blood pressure one time in the AM (morning) and one time in the PM (afternoon)! Please be sure to log blood pressures.

## 2024-04-21 NOTE — Lactation Note (Signed)
 This note was copied from a baby's chart.  NICU Lactation Consultation Note  Patient Name: Hailey Holden Date: 04/21/2024 Age:40 hours  Reason for consult: Other (Comment); Follow-up assessment; NICU baby; Late-preterm 34-36.6wks; Infant < 6lbs; Maternal endocrine disorder; Maternal discharge (cHTN, AMA, suspected trisomy 64) Type of Endocrine Disorder?: Diabetes; Thyroid  (Hashimoto (hypothyroid), A2GDM (metformin ))  SUBJECTIVE Visited with family of 67 85/69 weeks old AGA NICU female Hailey Holden; Ms. Wyrick is a P3 and reported she's been pumping consistently day and night, she finally got enough colostrum to collect to do oral care for baby, praised her for all her efforts. She's getting discharged from Couplet care today.  Reviewed discharge education, pump settings and the importance of consistent pumping for the onset of secretory activation and the prevention of engorgement. Explained how hands on pumping will improve the quality of her breastmilk. Discussed getting inserts for her Spectra  pump at home.  OBJECTIVE Infant data: Mother's Current Feeding Choice: Breast Milk and Donor Milk  O2 Device: Room Air  Infant feeding assessment IDFS - Readiness: 4   Maternal data: [id number] Vaginal, Spontaneous Pumping frequency: 6-7 times/24 hours Pumped volume: 0.2 mL Flange Size: 21 Hands-free pumping top sizes: Small/Medium (Blue)  Pump: Received Stork Pump (Spectra )  ASSESSMENT Infant: Feeding Status: Scheduled 9-12-3-6 Feeding method: Tube/Gavage (Bolus)  Maternal: Milk volume: Normal  INTERVENTIONS/PLAN Interventions: Interventions: Breast feeding basics reviewed; Coconut oil; DEBP; Education Discharge Education: Engorgement and breast care Tools: Pump; Flanges; Hands-free pumping top Pump Education: Setup, frequency, and cleaning; Milk Storage  Plan: STS around care times Pump both breasts on initiate mode every 3 hours for 15 minutes; ideally 8 pumping  sessions/24 hours Switch to maintain mode once expressing + 20 ml of EBM combined or by day 5 whichever happens first Bring all pump pieces to baby's new room in case he gets moved out of couplet care   FOB present and supportive. All questions and concerns answered, family to contact [facility] services PRN.  Consult Status: NICU follow-up NICU Follow-up type: Verify onset of copious milk; Verify absence of engorgement   Rielynn Trulson S Antoinette Borgwardt 04/21/2024, 11:40 AM

## 2024-04-21 NOTE — Progress Notes (Signed)
 Patient states that she already has a BP cuff at home to be utilized to take her blood pressures postpartum. She signed into her Baby scripts app around 2100 last night to make sure she can input her blood pressures.

## 2024-04-22 ENCOUNTER — Other Ambulatory Visit

## 2024-04-25 ENCOUNTER — Other Ambulatory Visit

## 2024-04-25 ENCOUNTER — Encounter: Admitting: Obstetrics & Gynecology

## 2024-04-26 ENCOUNTER — Ambulatory Visit

## 2024-04-27 NOTE — Lactation Note (Signed)
 This note was copied from a baby's chart.  NICU Lactation Consultation Note  Patient Name: Hailey Holden Date: 04/27/2024 Age:40 days  Reason for consult: Late-preterm 34-36.6wks; Weekly NICU follow-up; NICU baby; Other (Comment); Maternal endocrine disorder; Infant < 6lbs (Suspected Trisomy 21, AMA,) Type of Endocrine Disorder?: Diabetes (A2GDM (Metform))  SUBJECTIVE [name] and 8 day old, Hailey Holden were present. [name] expressed no heaviness or knots in either breast. [name] expressed increase in the milk output after 3 days of pumping consistently. [name] showed breastfeeding schedule to Student Hailey and Hailey Holden while informing us  about her consistency to pump every 3 hours for 30 minutes. [name] have a milk output of a minimum of 15 ml to 28 ml during each pumping session. [name] expresses with recent pumping experiences she noticed she has a low milk supply. Hailey Holden suggested doing a 1 hour power pump with 10 minute intervals (on and off pumping) one time in the morning, evening or night to increase milk making signals to the brain. Also suggest [name] to consider over counter meds called Moringa or to check with provider regarding prescribed Reglan to help increase milk output for low supply mothers. Lastly, [name] informed us  of sensitive/sore nipples due to pumping but has been using coconut oil to help with this feeling before pumping. Hailey Holden recommended Lanolin in addition to coconut oil to heal sore nipples and to hand express extra breast milk after pumping.  OBJECTIVE Infant data: Mother's Current Feeding Choice: Breast Milk  O2 Device: HHFNC O2 Flow Rate (L/min): 2 L/min FiO2 (%): 21 %  Infant feeding assessment IDFS - Readiness: 3   Maternal data: [id number] Vaginal, Spontaneous Pumping frequency: 8 times in 24/hrs Pumped volume: 15 mL (15-28 ml) Risk factor for low/delayed milk supply:: yes, pt says she had low supply with recent babies  Pump: Received Stork Pump  (Spectra )  ASSESSMENT Infant: Feeding Status: Scheduled 9-12-3-6 Feeding method: Tube/Gavage (Bolus)  Maternal: Milk volume: Low  INTERVENTIONS/PLAN Interventions: Interventions: Coconut oil; Education; Breast feeding basics reviewed; DEBP  Plan: STS around care times Pump both breasts on maintain mode every 3 hours for 20-30 minutes; ideally 8 pumping sessions/24 hours Power pump 1x/day Will consider over counter meds called Moringa or to check with provider regarding prescribed Reglan to help increase milk output for low supply mothers. Recommend Lanolin and coconut oil for nipple sensitivity and soreness before and after pumping   FOB present and supportive. All questions and concerns answered, family to contact [facility] services PRN. Consult Status: NICU follow-up NICU Follow-up type: Weekly NICU follow up   Hailey Holden 04/27/2024, 11:26 AM

## 2024-04-30 ENCOUNTER — Telehealth

## 2024-04-30 ENCOUNTER — Other Ambulatory Visit

## 2024-04-30 ENCOUNTER — Encounter: Payer: Self-pay | Admitting: Women's Health

## 2024-04-30 VITALS — BP 129/82

## 2024-04-30 DIAGNOSIS — O1003 Pre-existing essential hypertension complicating the puerperium: Secondary | ICD-10-CM

## 2024-04-30 DIAGNOSIS — I10 Essential (primary) hypertension: Secondary | ICD-10-CM

## 2024-04-30 NOTE — Progress Notes (Addendum)
" ° °  NURSE VISIT- BLOOD PRESSURE CHECK  I connected with Rosina CHRISTELLA Clap on 04/30/2024 by MyChart video and verified that I am speaking with the correct person using two identifiers.   I discussed the limitations of evaluation and management by telemedicine. The patient expressed understanding and agreed to proceed.  Nurse is at the office, and patient is at NICU visiting baby.  SUBJECTIVE:  Hailey Holden is a 39 y.o. [phone] female here for BP check. She is postpartum, delivery date 04/19/24.    HYPERTENSION ROS:  Postpartum:  Severe headaches that don't go away with tylenol /other medicines: No  Visual changes (seeing spots/double/blurred vision) No  Severe pain under right breast breast or in center of upper chest No  Severe nausea/vomiting No  Taking medicines as instructed yes    OBJECTIVE:  BP 129/82   LMP 05/21/2023 (Exact Date)   Appearance alert, well appearing, and in no distress.  ASSESSMENT: Postpartum  blood pressure check  PLAN: Discussed with Luke Fetters, CNM, [facility]   Recommendations: continue Norvasc  10mg  daily. Stop Labetalol . Check BP BID if pressures start to increase, resume Labetalol    Follow-up: as scheduled   Rutherford Rover  04/30/2024 2:43 PM  "

## 2024-05-03 ENCOUNTER — Encounter: Admitting: Obstetrics & Gynecology

## 2024-05-03 ENCOUNTER — Other Ambulatory Visit

## 2024-05-07 ENCOUNTER — Other Ambulatory Visit

## 2024-05-07 ENCOUNTER — Other Ambulatory Visit: Payer: Self-pay | Admitting: Family Medicine

## 2024-05-10 ENCOUNTER — Encounter: Admitting: Obstetrics & Gynecology

## 2024-05-10 ENCOUNTER — Other Ambulatory Visit

## 2024-05-29 ENCOUNTER — Ambulatory Visit: Admitting: Advanced Practice Midwife
